# Patient Record
Sex: Female | Born: 1935 | Race: White | Hispanic: No | Smoking: Never smoker
Health system: Southern US, Community
[De-identification: ages and names within clinical notes are randomized; demographics above are authoritative.]

## PROBLEM LIST (undated history)

## (undated) DIAGNOSIS — E785 Hyperlipidemia, unspecified: Secondary | ICD-10-CM

## (undated) DIAGNOSIS — I10 Essential (primary) hypertension: Secondary | ICD-10-CM

## (undated) DIAGNOSIS — I499 Cardiac arrhythmia, unspecified: Secondary | ICD-10-CM

## (undated) DIAGNOSIS — Z952 Presence of prosthetic heart valve: Secondary | ICD-10-CM

## (undated) HISTORY — PX: EYE SURGERY: SHX253

## (undated) HISTORY — PX: OTHER SURGICAL HISTORY: SHX169

## (undated) HISTORY — PX: GLAUCOMA SURGERY: SHX656

## (undated) HISTORY — PX: CATARACT EXTRACTION: SUR2

## (undated) HISTORY — PX: PACEMAKER INSERTION: SHX728

---

## 2015-10-14 ENCOUNTER — Encounter
Admission: RE | Admit: 2015-10-14 | Discharge: 2015-10-14 | Disposition: A | Discharge status: Home / Self Care | Payer: Medicare Other | Source: Ambulatory Visit | Attending: Cardiology | Admitting: Cardiology

## 2015-10-14 ENCOUNTER — Ambulatory Visit
Admission: RE | Admit: 2015-10-14 | Discharge: 2015-10-14 | Disposition: A | Discharge status: Home / Self Care | Payer: Medicare Other | Source: Ambulatory Visit | Attending: Cardiology | Admitting: Cardiology

## 2015-10-14 DIAGNOSIS — Z952 Presence of prosthetic heart valve: Secondary | ICD-10-CM | POA: Insufficient documentation

## 2015-10-14 DIAGNOSIS — I495 Sick sinus syndrome: Secondary | ICD-10-CM | POA: Diagnosis not present

## 2015-10-14 DIAGNOSIS — Z01818 Encounter for other preprocedural examination: Secondary | ICD-10-CM | POA: Diagnosis not present

## 2015-10-14 DIAGNOSIS — Z45010 Encounter for checking and testing of cardiac pacemaker pulse generator [battery]: Secondary | ICD-10-CM

## 2015-10-14 HISTORY — DX: Presence of prosthetic heart valve: Z95.2

## 2015-10-14 HISTORY — DX: Essential (primary) hypertension: I10

## 2015-10-14 HISTORY — DX: Cardiac arrhythmia, unspecified: I49.9

## 2015-10-14 HISTORY — DX: Hyperlipidemia, unspecified: E78.5

## 2015-10-14 LAB — BASIC METABOLIC PANEL
Anion gap: 8 (ref 5–15)
BUN: 23 mg/dL — AB (ref 6–20)
CALCIUM: 10.2 mg/dL (ref 8.9–10.3)
CHLORIDE: 103 mmol/L (ref 101–111)
CO2: 29 mmol/L (ref 22–32)
CREATININE: 1.13 mg/dL — AB (ref 0.44–1.00)
GFR calc non Af Amer: 43 mL/min — ABNORMAL LOW (ref >60)
GFR, EST AFRICAN AMERICAN: 50 mL/min — AB (ref >60)
GLUCOSE: 97 mg/dL (ref 65–99)
Potassium: 5.2 mmol/L — ABNORMAL HIGH (ref 3.5–5.1)
Sodium: 140 mmol/L (ref 135–145)

## 2015-10-14 LAB — DIFFERENTIAL
Basophils Absolute: 0 10*3/uL (ref 0–0.1)
Basophils Relative: 1 %
EOS ABS: 0.1 10*3/uL (ref 0–0.7)
EOS PCT: 1 %
LYMPHS ABS: 2 10*3/uL (ref 1.0–3.6)
LYMPHS PCT: 27 %
MONO ABS: 0.6 10*3/uL (ref 0.2–0.9)
MONOS PCT: 8 %
Neutro Abs: 4.8 10*3/uL (ref 1.4–6.5)
Neutrophils Relative %: 63 %

## 2015-10-14 LAB — CBC
HEMATOCRIT: 41.7 % (ref 35.0–47.0)
HEMOGLOBIN: 13.7 g/dL (ref 12.0–16.0)
MCH: 30.9 pg (ref 26.0–34.0)
MCHC: 32.8 g/dL (ref 32.0–36.0)
MCV: 94.2 fL (ref 80.0–100.0)
Platelets: 247 10*3/uL (ref 150–440)
RBC: 4.42 MIL/uL (ref 3.80–5.20)
RDW: 12.9 % (ref 11.5–14.5)
WBC: 7.6 10*3/uL (ref 3.6–11.0)

## 2015-10-14 LAB — SURGICAL PCR SCREEN
MRSA, PCR: NEGATIVE
STAPHYLOCOCCUS AUREUS: NEGATIVE

## 2015-10-14 MED ORDER — CEFAZOLIN SODIUM 1-5 GM-% IV SOLN
1.0000 g | Freq: Once | INTRAVENOUS | Status: DC
  Filled 2015-10-14: qty 50

## 2015-10-14 NOTE — Pre-Procedure Instructions (Signed)
Met B results faxed to Dr Saralyn Pilar, recheck K+ ordered per anesthesia protocol

## 2015-10-14 NOTE — Pre-Procedure Instructions (Addendum)
Heather at office notified patient H&P is out of date and contraindication of Cefazolin due to patient allergy to Penicillin (vomiting)

## 2015-10-14 NOTE — Patient Instructions (Signed)
  Your procedure is scheduled on: Wednesday 10/16/2015 Report to Day Surgery. 2ND FLOOR MEDICAL MALL ENTRANCE To find out your arrival time please call [phone] between 1PM - 3PM on Tuesday 10/15/2015.  Remember: Instructions that are not followed completely may result in serious medical risk, up to and including death, or upon the discretion of your surgeon and anesthesiologist your surgery may need to be rescheduled.    __X__ 1. Do not eat food or drink liquids after midnight. No gum chewing or hard candies.     __X__ 2. No Alcohol for 24 hours before or after surgery.   ____ 3. Bring all medications with you on the day of surgery if instructed.    __X__ 4. Notify your doctor if there is any change in your medical condition     (cold, fever, infections).     Do not wear jewelry, make-up, hairpins, clips or nail polish.  Do not wear lotions, powders, or perfumes.   Do not shave 48 hours prior to surgery. Men may shave face and neck.  Do not bring valuables to the hospital.    [facility] is not responsible for any belongings or valuables.               Contacts, dentures or bridgework may not be worn into surgery.  Leave your suitcase in the car. After surgery it may be brought to your room.  For patients admitted to the hospital, discharge time is determined by your                treatment team.   Patients discharged the day of surgery will not be allowed to drive home.   Please read over the following fact sheets that you were given:   Surgical Site Infection Prevention   __X__ Take these medicines the morning of surgery with A SIP OF WATER:    1. AMLODIPINE  2.   3.   4.  5.  6.  ____ Fleet Enema (as directed)   __X__ Use CHG Soap as directed  ____ Use inhalers on the day of surgery  ____ Stop metformin 2 days prior to surgery    ____ Take 1/2 of usual insulin dose the night before surgery and none on the morning of surgery.   __X__ Stop  Coumadin/Plavix/aspirin on TODAY  ____ Stop Anti-inflammatories on    ____ Stop supplements until after surgery.    ____ Bring C-Pap to the hospital.

## 2015-10-14 NOTE — Pre-Procedure Instructions (Addendum)
TO: Cancel  Ancef 1 gram, no new antibiotic orders. Dr. Sula RumpleParaschos/CNewman RN

## 2015-10-16 ENCOUNTER — Encounter
Admission: RE | Disposition: A | Discharge status: Home / Self Care | Payer: Self-pay | Source: Ambulatory Visit | Attending: Cardiology

## 2015-10-16 ENCOUNTER — Inpatient Hospital Stay: Payer: Medicare Other | Admitting: *Deleted

## 2015-10-16 ENCOUNTER — Encounter: Payer: Self-pay | Admitting: *Deleted

## 2015-10-16 ENCOUNTER — Ambulatory Visit
Admission: RE | Admit: 2015-10-16 | Discharge: 2015-10-16 | Disposition: A | Discharge status: Home / Self Care | Payer: Medicare Other | Source: Ambulatory Visit | Attending: Cardiology | Admitting: Cardiology

## 2015-10-16 DIAGNOSIS — Z981 Arthrodesis status: Secondary | ICD-10-CM | POA: Insufficient documentation

## 2015-10-16 DIAGNOSIS — Z79890 Hormone replacement therapy (postmenopausal): Secondary | ICD-10-CM | POA: Insufficient documentation

## 2015-10-16 DIAGNOSIS — Z9889 Other specified postprocedural states: Secondary | ICD-10-CM | POA: Diagnosis not present

## 2015-10-16 DIAGNOSIS — I495 Sick sinus syndrome: Secondary | ICD-10-CM | POA: Diagnosis present

## 2015-10-16 DIAGNOSIS — Z45010 Encounter for checking and testing of cardiac pacemaker pulse generator [battery]: Secondary | ICD-10-CM | POA: Insufficient documentation

## 2015-10-16 DIAGNOSIS — I10 Essential (primary) hypertension: Secondary | ICD-10-CM | POA: Diagnosis not present

## 2015-10-16 DIAGNOSIS — Z888 Allergy status to other drugs, medicaments and biological substances status: Secondary | ICD-10-CM | POA: Insufficient documentation

## 2015-10-16 DIAGNOSIS — Z79899 Other long term (current) drug therapy: Secondary | ICD-10-CM | POA: Diagnosis not present

## 2015-10-16 DIAGNOSIS — Z880 Allergy status to penicillin: Secondary | ICD-10-CM | POA: Diagnosis not present

## 2015-10-16 DIAGNOSIS — I4891 Unspecified atrial fibrillation: Secondary | ICD-10-CM | POA: Diagnosis not present

## 2015-10-16 DIAGNOSIS — E785 Hyperlipidemia, unspecified: Secondary | ICD-10-CM | POA: Insufficient documentation

## 2015-10-16 DIAGNOSIS — Z7982 Long term (current) use of aspirin: Secondary | ICD-10-CM | POA: Diagnosis not present

## 2015-10-16 DIAGNOSIS — Z881 Allergy status to other antibiotic agents status: Secondary | ICD-10-CM | POA: Insufficient documentation

## 2015-10-16 HISTORY — PX: PACEMAKER INSERTION: SHX728

## 2015-10-16 LAB — POCT I-STAT 4, (NA,K, GLUC, HGB,HCT)
GLUCOSE: 112 mg/dL — AB (ref 65–99)
HCT: 40 % (ref 36.0–46.0)
Hemoglobin: 13.6 g/dL (ref 12.0–15.0)
Potassium: 4 mmol/L (ref 3.5–5.1)
Sodium: 139 mmol/L (ref 135–145)

## 2015-10-16 SURGERY — INSERTION, CARDIAC PACEMAKER
Anesthesia: Monitor Anesthesia Care | Site: Chest | Laterality: Right

## 2015-10-16 MED ORDER — SODIUM CHLORIDE 0.9 % IR SOLN
Freq: Once | Status: AC
  Administered 2015-10-16: 300 mL
  Filled 2015-10-16 (×2): qty 2

## 2015-10-16 MED ORDER — FAMOTIDINE 20 MG PO TABS
ORAL_TABLET | ORAL | Status: AC
  Administered 2015-10-16: 20 mg via ORAL
  Filled 2015-10-16: qty 1

## 2015-10-16 MED ORDER — LIDOCAINE HCL 1 % IJ SOLN
INTRAMUSCULAR | Status: DC | PRN
  Administered 2015-10-16: 20 mL

## 2015-10-16 MED ORDER — FAMOTIDINE 20 MG PO TABS
20.0000 mg | ORAL_TABLET | Freq: Once | ORAL | Status: AC
  Administered 2015-10-16: 20 mg via ORAL

## 2015-10-16 MED ORDER — GENTAMICIN SULFATE 40 MG/ML IJ SOLN
INTRAMUSCULAR | Status: AC
  Filled 2015-10-16: qty 2

## 2015-10-16 MED ORDER — FENTANYL CITRATE (PF) 100 MCG/2ML IJ SOLN: 25.0000 ug | INTRAMUSCULAR | Status: DC | PRN

## 2015-10-16 MED ORDER — ONDANSETRON HCL 4 MG/2ML IJ SOLN: 4.0000 mg | Freq: Once | INTRAMUSCULAR | Status: DC | PRN

## 2015-10-16 MED ORDER — CEFAZOLIN SODIUM 1-5 GM-% IV SOLN: 1.0000 g | Freq: Once | INTRAVENOUS | Status: DC

## 2015-10-16 MED ORDER — LACTATED RINGERS IV SOLN
INTRAVENOUS | Status: DC
  Administered 2015-10-16: 12:00:00 via INTRAVENOUS

## 2015-10-16 MED ORDER — LIDOCAINE HCL (CARDIAC) 20 MG/ML IV SOLN
INTRAVENOUS | Status: DC | PRN
  Administered 2015-10-16: 60 mg via INTRAVENOUS

## 2015-10-16 MED ORDER — HEPARIN SODIUM (PORCINE) 5000 UNIT/ML IJ SOLN
INTRAMUSCULAR | Status: AC
  Filled 2015-10-16: qty 1

## 2015-10-16 MED ORDER — PROPOFOL 500 MG/50ML IV EMUL
INTRAVENOUS | Status: DC | PRN
  Administered 2015-10-16: 75 ug/kg/min via INTRAVENOUS

## 2015-10-16 MED ORDER — CEFAZOLIN SODIUM 1-5 GM-% IV SOLN
INTRAVENOUS | Status: AC
  Filled 2015-10-16: qty 50

## 2015-10-16 MED ORDER — SODIUM CHLORIDE 0.9 % IJ SOLN
INTRAMUSCULAR | Status: AC
  Filled 2015-10-16: qty 50

## 2015-10-16 MED ORDER — AZITHROMYCIN 250 MG PO TABS: ORAL_TABLET | ORAL | Status: DC

## 2015-10-16 SURGICAL SUPPLY — 27 items
CABLE SURG 12 DISP A/V CHANNEL (MISCELLANEOUS) ×3 IMPLANT
CANISTER SUCT 1200ML W/VALVE (MISCELLANEOUS) ×3 IMPLANT
CHLORAPREP W/TINT 26ML (MISCELLANEOUS) ×3 IMPLANT
COVER LIGHT HANDLE STERIS (MISCELLANEOUS) ×6 IMPLANT
COVER MAYO STAND STRL (DRAPES) ×3 IMPLANT
DECANTER SPIKE VIAL GLASS SM (MISCELLANEOUS) ×3 IMPLANT
DEVICE DISSECT PLASMABLAD 3.0S (MISCELLANEOUS) ×1 IMPLANT
DRESSING TELFA 4X3 1S ST N-ADH (GAUZE/BANDAGES/DRESSINGS) ×6 IMPLANT
ELECT REM PT RETURN 9FT ADLT (ELECTROSURGICAL) ×3
ELECTRODE REM PT RTRN 9FT ADLT (ELECTROSURGICAL) ×1 IMPLANT
GLOVE BIO SURGEON STRL SZ7.5 (GLOVE) ×3 IMPLANT
GLOVE BIO SURGEON STRL SZ8 (GLOVE) ×3 IMPLANT
GOWN STRL REUS W/ TWL LRG LVL3 (GOWN DISPOSABLE) ×1 IMPLANT
GOWN STRL REUS W/ TWL XL LVL3 (GOWN DISPOSABLE) ×1 IMPLANT
GOWN STRL REUS W/TWL LRG LVL3 (GOWN DISPOSABLE) ×2
GOWN STRL REUS W/TWL XL LVL3 (GOWN DISPOSABLE) ×2
KIT RM TURNOVER STRD PROC AR (KITS) ×3 IMPLANT
LABEL OR SOLS (LABEL) ×3 IMPLANT
MARKER SKIN W/RULER 31145785 (MISCELLANEOUS) ×3 IMPLANT
NEEDLE FILTER BLUNT 18X 1/2SAF (NEEDLE) ×2
NEEDLE FILTER BLUNT 18X1 1/2 (NEEDLE) ×1 IMPLANT
NS IRRIG 500ML POUR BTL (IV SOLUTION) ×3 IMPLANT
PACK PACE INSERTION (MISCELLANEOUS) ×3 IMPLANT
PAD STATPAD (MISCELLANEOUS) ×3 IMPLANT
PLASMABLADE 3.0S (MISCELLANEOUS) ×3
PPM ADVISA MRI DR A2DR01 (Pacemaker) ×3 IMPLANT
SUT SILK 2 0 SH (SUTURE) ×3 IMPLANT

## 2015-10-16 NOTE — Anesthesia Preprocedure Evaluation (Addendum)
Anesthesia Evaluation  Patient identified by MRN, date of birth, ID band Patient awake    Reviewed: Allergy & Precautions, NPO status , Patient's Chart, lab work & pertinent test results  Airway Mallampati: I  TM Distance: >3 FB Neck ROM: Limited    Dental  (+) Teeth Intact   Pulmonary    Pulmonary exam normal        Cardiovascular Exercise Tolerance: Good hypertension, Pt. on medications + dysrhythmias Atrial Fibrillation + pacemaker (-) Cardiac Defibrillator  Rhythm:Regular Rate:Normal + Systolic murmurs Aortic valve replacement, afib, AICD.   Neuro/Psych    GI/Hepatic   Endo/Other    Renal/GU      Musculoskeletal   Abdominal Normal abdominal exam  (+)   Peds  Hematology   Anesthesia Other Findings   Reproductive/Obstetrics                           Anesthesia Physical Anesthesia Plan  ASA: IV  Anesthesia Plan: MAC   Post-op Pain Management:    Induction: Intravenous  Airway Management Planned: Nasal Cannula  Additional Equipment:   Intra-op Plan:   Post-operative Plan:   Informed Consent: I have reviewed the patients History and Physical, chart, labs and discussed the procedure including the risks, benefits and alternatives for the proposed anesthesia with the patient or authorized representative who has indicated his/her understanding and acceptance.     Plan Discussed with: CRNA  Anesthesia Plan Comments:         Anesthesia Quick Evaluation

## 2015-10-16 NOTE — Interval H&P Note (Signed)
History and Physical Interval Note:  10/16/2015 7:13 AM  [name]  has presented today for surgery, with the diagnosis of SINOTRIAL NODE DYSFUNCTION  The various methods of treatment have been discussed with the patient and family. After consideration of risks, benefits and other options for treatment, the patient has consented to  Procedure(s): INSERTION PACEMAKER  (N/A) as a surgical intervention .  The patient's history has been reviewed, patient examined, no change in status, stable for surgery.  I have reviewed the patient's chart and labs.  Questions were answered to the patient's satisfaction.     Tino Ronan

## 2015-10-16 NOTE — H&P (Signed)
Printout Information Document Contents Office Visit Document Received Date 10/16/2015 Document Source Organization [facility] Patient Demographics  Encounter Details  Progress Notes  Plan of Treatment  Visit Diagnoses  Document Information Encounter Summary - [name] (82 y.o. Female)As of Jan. 18, 2017 Patient Demographics Patient Address Communication Language Race / Ethnicity  [address] [place], [place] [address]  [phone] [facility]) [phone] (Mobile) [email or web address]  English (Preferred) White / Not Hispanic or Latino  Encounter Details Date Type Department Care Team Description  09/04/2015 Office Visit [facility]  [address]  [place], [place] [address]  [phone]  Denton Ar, MD  [address]  [facility] [address] - CARDIOLOGY  [place], [place] [address]  [phone]  [phone] (Fax)  Aortic prosthetic valve regurgitation, subsequent encounter (Primary Dx);Nonrheumatic aortic valve stenosis;Heart block AV third degree [facility])  Progress Notes - in this encounter Denton Ar, MD - 09/04/2015 10:45 AM EST Formatting of this note may be different from the original.   Chief Complaint: No chief complaint on file. Date of Service: 09/04/2015 Date of Birth: 05/28/1929 PCP: NONE  History of Present Illness: [name] is a 81 y.o.female patient who returns for follow-up visit. She has a history of aortic stenosis status post tissue prosthesis aortic valve replacement. She has a history of high-grade heart block which occurred at time of her valve surgery. She has a permanent pacemaker in place . Interrogation of her pacemaker today reveals approximately 5 months of battery life remaining. Will refer for generator change out. Risk and benefits of this procedure were explained to patient and her daughter. Past Medical and Surgical History  Past Medical History Past Medical History  Diagnosis  Date  . Aortic stenosis  s/p aortic valve replacement  . Atrial fibrillation, unspecified  . Cataract cortical, senile  . Hyperlipidemia  . Hypertension   Past Surgical History She has a past surgical history that includes lens eye surgery (Left, 04/22/09); lens eye surgery (Right, 06/24/09); glaucoma eye surgery (Left, 04/22/09); glaucoma eye surgery (Right, 06/24/09); back fusion; Insert / replace / remove pacemaker; and Cardiac valve replacement.   Medications and Allergies  Current Medications  Current Outpatient Prescriptions  Medication Sig Dispense Refill  . amLODIPine (NORVASC) 5 MG tablet Take 1 tablet (5 mg total) by mouth once daily. 30 tablet 0  . aspirin 81 MG EC tablet Take 81 mg by mouth daily.  . calcium carbonate (TUMS ULTRA) 400 mg (1,000 mg) chewable tablet Take 400 mg of elemental by mouth once daily.  Marland Kitchen estradiol (ESTRACE) 0.01 % (0.1 mg/gram) vaginal cream Place 2 g vaginally once a week.   No current facility-administered medications for this visit.   Allergies: Biaxin [clarithromycin]; Darvocet-n 100 [propoxyphene n-acetaminophen]; Hydrochlorothiazide; Penicillins; Betoptic s [betaxolol]; Lisinopril; Mydriacyl [tropicamide]; Naproxen; Amoxicillin (bulk); Cephalexin (bulk); Furosemide (bulk); and Gabapentin (bulk)  Social and Family History  Social History reports that she has never smoked. She does not have any smokeless tobacco history on file. Alcohol use questions deferred to the physician. She reports that she does not use illicit drugs.  Family History Family History  Problem Relation Age of Onset  . No Known Problems Mother  . No Known Problems Father   Review of Systems  Review of Systems  Constitutional: Negative for chills, diaphoresis, fever, malaise/fatigue and weight loss.  HENT: Negative for congestion, ear discharge, hearing loss and tinnitus.  Eyes: Negative for blurred vision.  Respiratory: Positive for shortness of breath. Negative for  cough, hemoptysis,  sputum production and wheezing.  Cardiovascular: Negative for chest pain, palpitations, orthopnea, claudication, leg swelling and PND.  Gastrointestinal: Negative for abdominal pain, blood in stool, constipation, diarrhea, heartburn, melena, nausea and vomiting.  Genitourinary: Negative for dysuria, frequency, hematuria and urgency.  Musculoskeletal: Negative for back pain, falls, joint pain and myalgias.  Skin: Negative for itching and rash.  Neurological: Negative for dizziness, tingling, focal weakness, loss of consciousness, weakness and headaches.  Endo/Heme/Allergies: Negative for polydipsia. Does not bruise/bleed easily.  Psychiatric/Behavioral: Negative for depression, memory loss and substance abuse. The patient is not nervous/anxious.    Physical Examination   Vitals: There were no vitals taken for this visit. Ht: Wt: ZOX:WRUEA is no height or weight on file to calculate BSA. There is no height or weight on file to calculate BMI.  Wt Readings from Last 3 Encounters:  08/27/15 60.8 kg (134 lb)  08/08/15 61.1 kg (134 lb 12.8 oz)  03/05/15 61.5 kg (135 lb 9.6 oz)   BP Readings from Last 3 Encounters:  08/27/15 140/82  08/08/15 144/78  03/05/15 136/64  general: Caucasian female in no acute distress  LUNGS Breath Sounds: Normal Percussion: Normal  CARDIOVASCULAR JVP CV wave: no HJR: no Elevation at 90 degrees: None Carotid Pulse: normal pulsation bilaterally Bruit: None Apex: apical impulse normal  Auscultation Rhythm: normal sinus rhythm and normal pacemaker rhythm S1: normal S2: normal Clicks: no Rub: no Murmurs: 1/6 medium pitched crescendo-decrescendo coarse at lower left sternal border, at upper left sternal border  Gallop: None ABDOMEN Liver enlargement: no Pulsatile aorta: no Ascites: no Bruits: no  EXTREMITIES Clubbing: no Edema: trace to 1+ bilateral pedal edema Pulses: peripheral pulses symmetrical Femoral Bruits:  no Amputation: no SKIN Rash: no Cyanosis: no Embolic phemonenon: no Bruising: no NEURO Alert and Oriented to person, place and time: yes Non focal: yes LABS Last 3 CBC results: Lab Results  Component Value Date  WBC 7.1 06/18/2009  WBC 6.6 04/10/2009   Lab Results  Component Value Date  HGB 12.6 06/18/2009  HGB 12.4 04/10/2009   Lab Results  Component Value Date  HCT 0.37 06/18/2009  HCT 0.37 04/10/2009   Lab Results  Component Value Date  PLT 268 06/18/2009  PLT 286 04/10/2009   Lab Results  Component Value Date  CREATININE 1.3 (H) 06/18/2009  BUN 23 (H) 06/18/2009  NA 136 06/18/2009  K 4.9 06/18/2009  CL 102 06/18/2009  CO2 28 06/18/2009     Assessment and Plan   82 y.o. female with  ICD-10-CM ICD-9-CM  1. Nonrheumatic aortic valve stenosis-complains of more exertional shortness of breath. EKG reveals paced rhythm. ECHO showed adequate prosthetic aortic valve function. Chest x-ray the no acute cardiopulmonary disease I35.0 424.1  2. Paroxysmal atrial fibrillation-currently in sinus rhythm. Will continue with current regimen . I48.0 427.31  3. Heart block AV third degree-pacemaker is functioning normally. Will schedule generator change out or the 3rd week in January. Risk and benefits of this procedure were explained I44.2 426.0   Return in about 3 months (around 12/03/2015).  These notes generated with voice recognition software. I apologize for typographical errors.  Denton Ar, MD    Plan of Treatment - as of this encounter Upcoming Encounters Upcoming Encounters  Date Type Specialty Care Team Description  11/05/2015 Appointment Cardiology Fath, Glenetta Borg, MD  [address] [facility] [address]  [facility] [address] - CARDIOLOGY  [place], [place] [address]  [phone]  [phone] (Fax)    11/20/2015 Appointment Cardiology Fath, Glenetta Borg, MD  [phone]  [facility] [address]  [facility] [address] - CARDIOLOGY  [place], [place] [address]   [phone]  [phone] (Fax)    Visit Diagnoses - in this encounter Diagnosis  Aortic prosthetic valve regurgitation, subsequent encounter - Primary  Nonrheumatic aortic valve stenosis  Heart block AV third degree [facility])  Atrioventricular block, complete   Document Information Service Providers Document Coverage Dates Dec. 07, 2016 - Dec. 08, 2016 Custodian Organization [facility] [phone] (Work) [place], [place] [address] Encounter Providers Denton Ar (Attending) [phone] (Work) [phone] (Fax)  [phone] [facility] [address] [facility] - CARDIOLOGY [place], [place] [address] Encounter Date Dec. 07, 2016 - Dec. 08, 2016 Printout Information Document Contents Office Visit Document Received Date 10/16/2015 Document Source Organization [facility] Patient Demographics  Encounter Details  Progress Notes  Plan of Treatment  Visit Diagnoses  Document Information Encounter Summary - [name] (82 y.o. Female)As of Jan. 18, 2017 Patient Demographics Patient Address Communication Language Race / Ethnicity  [address] [place], [place] [address]  [phone] [facility]) [phone] (Mobile) [email or web address]  English (Preferred) White / Not Hispanic or Latino  Encounter Details Date Type Department Care Team Description  09/04/2015 Office Visit [facility]  [address]  [place], [place] [address]  [phone]  Denton Ar, MD  [address] [facility] [address]  [facility] [address] - CARDIOLOGY  [place], [place] [address]  [phone]  [phone] (Fax)  Aortic prosthetic valve regurgitation, subsequent encounter (Primary Dx);Nonrheumatic aortic valve stenosis;Heart block AV third degree [facility])  Progress Notes - in this encounter Denton Ar, MD - 09/04/2015 10:45 AM EST Formatting of this note may be different from the original.   Chief Complaint: No chief complaint on file. Date of  Service: 09/04/2015 Date of Birth: 03-30-1929 PCP: NONE  History of Present Illness: Ms. Giaimo is a 82 y.o.female patient who returns for follow-up visit. She has a history of aortic stenosis status post tissue prosthesis aortic valve replacement. She has a history of high-grade heart block which occurred at time of her valve surgery. She has a permanent pacemaker in place . Interrogation of her pacemaker today reveals approximately 5 months of battery life remaining. Will refer for generator change out. Risk and benefits of this procedure were explained to patient and her daughter. Past Medical and Surgical History  Past Medical History Past Medical History  Diagnosis Date  . Aortic stenosis  s/p aortic valve replacement  . Atrial fibrillation, unspecified  . Cataract cortical, senile  . Hyperlipidemia  . Hypertension   Past Surgical History She has a past surgical history that includes lens eye surgery (Left, 04/22/09); lens eye surgery (Right, 06/24/09); glaucoma eye surgery (Left, 04/22/09); glaucoma eye surgery (Right, 06/24/09); back fusion; Insert / replace / remove pacemaker; and Cardiac valve replacement.   Medications and Allergies  Current Medications  Current Outpatient Prescriptions  Medication Sig Dispense Refill  . amLODIPine (NORVASC) 5 MG tablet Take 1 tablet (5 mg total) by mouth once daily. 30 tablet 0  . aspirin 81 MG EC tablet Take 81 mg by mouth daily.  . calcium carbonate (TUMS ULTRA) 400 mg (1,000 mg) chewable tablet Take 400 mg of elemental by mouth once daily.  Marland Kitchen estradiol (ESTRACE) 0.01 % (0.1 mg/gram) vaginal cream Place 2 g vaginally once a week.   No current facility-administered medications for this visit.   Allergies: Biaxin [clarithromycin]; Darvocet-n 100 [propoxyphene n-acetaminophen]; Hydrochlorothiazide; Penicillins; Betoptic s [betaxolol]; Lisinopril; Mydriacyl [tropicamide]; Naproxen; Amoxicillin (bulk); Cephalexin (bulk); Furosemide (bulk); and  Gabapentin (bulk)  Social and Family History  Social History reports that she has never smoked. She does not have any smokeless tobacco history on file. Alcohol use questions deferred to the physician. She reports that she does not use illicit drugs.  Family History Family History  Problem Relation Age of Onset  . No Known Problems Mother  . No Known Problems Father   Review of Systems  Review of Systems  Constitutional: Negative for chills, diaphoresis, fever, malaise/fatigue and weight loss.  HENT: Negative for congestion, ear discharge, hearing loss and tinnitus.  Eyes: Negative for blurred vision.  Respiratory: Positive for shortness of breath. Negative for cough, hemoptysis, sputum production and wheezing.  Cardiovascular: Negative for chest pain, palpitations, orthopnea, claudication, leg swelling and PND.  Gastrointestinal: Negative for abdominal pain, blood in stool, constipation, diarrhea, heartburn, melena, nausea and vomiting.  Genitourinary: Negative for dysuria, frequency, hematuria and urgency.  Musculoskeletal: Negative for back pain, falls, joint pain and myalgias.  Skin: Negative for itching and rash.  Neurological: Negative for dizziness, tingling, focal weakness, loss of consciousness, weakness and headaches.  Endo/Heme/Allergies: Negative for polydipsia. Does not bruise/bleed easily.  Psychiatric/Behavioral: Negative for depression, memory loss and substance abuse. The patient is not nervous/anxious.    Physical Examination   Vitals: There were no vitals taken for this visit. Ht: Wt: ZOX:WRUEA is no height or weight on file to calculate BSA. There is no height or weight on file to calculate BMI.  Wt Readings from Last 3 Encounters:  08/27/15 60.8 kg (134 lb)  08/08/15 61.1 kg (134 lb 12.8 oz)  03/05/15 61.5 kg (135 lb 9.6 oz)   BP Readings from Last 3 Encounters:  08/27/15 140/82  08/08/15 144/78  03/05/15 136/64  general: Caucasian female in no acute  distress  LUNGS Breath Sounds: Normal Percussion: Normal  CARDIOVASCULAR JVP CV wave: no HJR: no Elevation at 90 degrees: None Carotid Pulse: normal pulsation bilaterally Bruit: None Apex: apical impulse normal  Auscultation Rhythm: normal sinus rhythm and normal pacemaker rhythm S1: normal S2: normal Clicks: no Rub: no Murmurs: 1/6 medium pitched crescendo-decrescendo coarse at lower left sternal border, at upper left sternal border  Gallop: None ABDOMEN Liver enlargement: no Pulsatile aorta: no Ascites: no Bruits: no  EXTREMITIES Clubbing: no Edema: trace to 1+ bilateral pedal edema Pulses: peripheral pulses symmetrical Femoral Bruits: no Amputation: no SKIN Rash: no Cyanosis: no Embolic phemonenon: no Bruising: no NEURO Alert and Oriented to person, place and time: yes Non focal: yes LABS Last 3 CBC results: Lab Results  Component Value Date  WBC 7.1 06/18/2009  WBC 6.6 04/10/2009   Lab Results  Component Value Date  HGB 12.6 06/18/2009  HGB 12.4 04/10/2009   Lab Results  Component Value Date  HCT 0.37 06/18/2009  HCT 0.37 04/10/2009   Lab Results  Component Value Date  PLT 268 06/18/2009  PLT 286 04/10/2009   Lab Results  Component Value Date  CREATININE 1.3 (H) 06/18/2009  BUN 23 (H) 06/18/2009  NA 136 06/18/2009  K 4.9 06/18/2009  CL 102 06/18/2009  CO2 28 06/18/2009     Assessment and Plan   82 y.o. female with  ICD-10-CM ICD-9-CM  1. Nonrheumatic aortic valve stenosis-complains of more exertional shortness of breath. EKG reveals paced rhythm. ECHO showed adequate prosthetic aortic valve function. Chest x-ray the no acute cardiopulmonary disease I35.0 424.1  2. Paroxysmal atrial fibrillation-currently in sinus rhythm. Will continue with current regimen . I48.0 427.31  3. Heart block AV third degree-pacemaker  is functioning normally. Will schedule generator change out or the 3rd week in January. Risk and benefits of this  procedure were explained I44.2 426.0   Return in about 3 months (around 12/03/2015).  These notes generated with voice recognition software. I apologize for typographical errors.  Denton Ar, MD    Plan of Treatment - as of this encounter Upcoming Encounters Upcoming Encounters  Date Type Specialty Care Team Description  11/05/2015 Appointment Cardiology Fath, Glenetta Borg, MD  [address] [facility] [address]  [facility] - CARDIOLOGY  [place], [place] [address]  [phone]  [phone] (Fax)    11/20/2015 Appointment Cardiology Fath, Glenetta Borg, MD  [address] [facility] [address]  [facility] - CARDIOLOGY  [place], [place] [address]  [phone]  [phone] (Fax)    Visit Diagnoses - in this encounter Diagnosis  Aortic prosthetic valve regurgitation, subsequent encounter - Primary  Nonrheumatic aortic valve stenosis  Heart block AV third degree [facility])  Atrioventricular block, complete   Document Information Service Providers Document Coverage Dates Dec. 07, 2016 - Dec. 08, 2016 Custodian Organization [facility] [phone] (Work) [place], [place] [address] Encounter Providers Denton Ar (Attending) [phone] (Work) [phone] (Fax)  [phone] [facility] [address] [facility] - CARDIOLOGY [place], [place] [address] Encounter Date Dec. 07, 2016 - Dec. 08, 2016

## 2015-10-16 NOTE — Discharge Instructions (Signed)

## 2015-10-16 NOTE — Anesthesia Postprocedure Evaluation (Signed)
Anesthesia Post Note  Patient: [name]  Procedure(s) Performed: Procedure(s) (LRB): ICD GENERATOR CHANGE (Right)  Patient location during evaluation: PACU Anesthesia Type: General Level of consciousness: awake Pain management: pain level controlled Vital Signs Assessment: post-procedure vital signs reviewed and stable Respiratory status: spontaneous breathing Cardiovascular status: blood pressure returned to baseline Postop Assessment: no headache Anesthetic complications: no    Last Vitals:  Filed Vitals:   10/16/15 1111  BP: 162/66  Pulse: 91  Temp: 35.7 C  Resp: 16    Last Pain: There were no vitals filed for this visit.               Rickita Forstner M

## 2015-10-16 NOTE — Transfer of Care (Signed)
Immediate Anesthesia Transfer of Care Note  Patient: [name]  Procedure(s) Performed: Procedure(s): ICD GENERATOR CHANGE (Right)  Patient Location: PACU  Anesthesia Type:General  Level of Consciousness: awake, alert  and oriented  Airway & Oxygen Therapy: Patient Spontanous Breathing and Patient connected to face mask oxygen  Post-op Assessment: Report given to RN and Post -op Vital signs reviewed and stable  Post vital signs: Reviewed and stable  Last Vitals: 99% sat at 13:06 hr 64 pacing  Filed Vitals:   10/16/15 1111 10/16/15 1300  BP: 162/66 126/61  Pulse: 91   Temp: 35.7 C 36.7 C  Resp: 16 16    Complications: No apparent anesthesia complications

## 2015-10-16 NOTE — Op Note (Signed)
[facility]   10/16/2015                     1:09 PM  PATIENT:  [name]    PRE-OPERATIVE DIAGNOSIS:  SINOTRIAL NODE DYSFUNCTION  POST-OPERATIVE DIAGNOSIS:  Same  PROCEDURE:  ICD GENERATOR CHANGE  SURGEON:  Marcina Millard, MD    ANESTHESIA:     PREOPERATIVE INDICATIONS:  [name] is a  82 y.o. female with a diagnosis of SINOTRIAL NODE DYSFUNCTION who failed conservative measures and elected for surgical management.    The risks benefits and alternatives were discussed with the patient preoperatively including but not limited to the risks of infection, bleeding, cardiopulmonary complications, the need for revision surgery, among others, and the patient was willing to proceed.   OPERATIVE PROCEDURE: The patient was brought to the operating room the fasting state. The right pectoral region was prepped and draped in the usual sterile manner. Anesthesia was obtained with 1% lidocaine locally. A 6 cm incision was performed over the right pectoral region. The old pacemaker generator was retrieved by electrocautery and blunt dissection. The leads were disconnected and connected to a new MRI compatible dual-chamber rate responsive pacemaker generator ( Medtronic A2 DRO 1). The pocket was closed with 20 and 4-0 Vicryl, respectively. Steri-Strips and pressure dressing were applied. There were no procedural complications.

## 2015-12-11 ENCOUNTER — Ambulatory Visit
Admission: EM | Admit: 2015-12-11 | Discharge: 2015-12-11 | Disposition: A | Discharge status: Home / Self Care | Payer: Medicare Other | Attending: Family Medicine | Admitting: Family Medicine

## 2015-12-11 ENCOUNTER — Ambulatory Visit: Payer: Medicare Other

## 2015-12-11 ENCOUNTER — Encounter: Payer: Self-pay | Admitting: Emergency Medicine

## 2015-12-11 DIAGNOSIS — Z7982 Long term (current) use of aspirin: Secondary | ICD-10-CM | POA: Insufficient documentation

## 2015-12-11 DIAGNOSIS — I10 Essential (primary) hypertension: Secondary | ICD-10-CM | POA: Diagnosis not present

## 2015-12-11 DIAGNOSIS — Z952 Presence of prosthetic heart valve: Secondary | ICD-10-CM | POA: Diagnosis not present

## 2015-12-11 DIAGNOSIS — Z79899 Other long term (current) drug therapy: Secondary | ICD-10-CM | POA: Diagnosis not present

## 2015-12-11 DIAGNOSIS — I4891 Unspecified atrial fibrillation: Secondary | ICD-10-CM | POA: Diagnosis not present

## 2015-12-11 DIAGNOSIS — J0110 Acute frontal sinusitis, unspecified: Secondary | ICD-10-CM | POA: Diagnosis not present

## 2015-12-11 DIAGNOSIS — E785 Hyperlipidemia, unspecified: Secondary | ICD-10-CM | POA: Insufficient documentation

## 2015-12-11 DIAGNOSIS — J069 Acute upper respiratory infection, unspecified: Secondary | ICD-10-CM | POA: Diagnosis present

## 2015-12-11 MED ORDER — AZITHROMYCIN 250 MG PO TABS: ORAL_TABLET | ORAL | Status: DC

## 2015-12-11 MED ORDER — BENZONATATE 100 MG PO CAPS: 100.0000 mg | ORAL_CAPSULE | Freq: Three times a day (TID) | ORAL | Status: DC | PRN

## 2015-12-11 NOTE — ED Provider Notes (Signed)
[facility]  ____________________________________________  Time seen: Approximately 1620 PM  I have reviewed the triage vital signs and the nursing notes.   HISTORY  Chief Complaint URI   HPI [name] is a 82 y.o. female presents with daughter at bedside, for the complaints of 7-8 days of runny nose, nasal congestion, cough and sinus pressure. Reports occasionally coughing up and blowing nose and greenish mucus. Denies fevers. Reports continues to eat and drink well. Reports continues to remain active. States cough is mild. Patient states that her daughter wanted her to be checked out.  Denies chest pain, shortness of breath, dizziness, weakness, hearing changes, abdominal pain, dysuria, neck pain, back pain, extremity pain or extremity swelling. Denies recent sickness.   PCP: Lady Gary   Past Medical History  Diagnosis Date  . Dysrhythmia     A-fib  . Hyperlipidemia   . Hypertension   . H/O aortic valve replacement     There are no active problems to display for this patient.   Past Surgical History  Procedure Laterality Date  . Aortic stenosos    . Eye surgery    . Cataract extraction    . Glaucoma surgery    . Pacemaker insertion Right   . Pacemaker insertion Right 10/16/2015    Procedure: ICD GENERATOR CHANGE;  Surgeon: Marcina Millard, MD;  Location: [facility] ORS;  Service: Cardiovascular;  Laterality: Right;    Current Outpatient Rx  Name  Route  Sig  Dispense  Refill  . amLODipine (NORVASC) 5 MG tablet   Oral   Take 5 mg by mouth daily.         Marland Kitchen aspirin 81 MG tablet   Oral   Take 81 mg by mouth daily.         .           .           . CALCIUM & MAGNESIUM CARBONATES PO   Oral   Take 1 tablet by mouth daily.         Marland Kitchen estradiol (ESTRACE) 0.1 MG/GM vaginal cream   Vaginal   Place 1 Applicatorful vaginally at bedtime.         Marland Kitchen VITAMIN D, CHOLECALCIFEROL, PO   Oral   Take 1 tablet by mouth daily.            Allergies Lisinopril; Amoxicillin; Betoptic; Biaxin; Cephalexin; Furosemide; Gabapentin; Hydrochlorothiazide; Mydriacyl; Penicillins; and Naproxen  No family history on file.  Social History Social History  Substance Use Topics  . Smoking status: Never Smoker   . Smokeless tobacco: Never Used  . Alcohol Use: Yes     Comment: wine occ    Review of Systems Constitutional: No fever/chills Eyes: No visual changes. ENT: No sore throat. Positive runny nose, nasal congestion, sinus drainage.  Cardiovascular: Denies chest pain. Respiratory: Denies shortness of breath. Gastrointestinal: No abdominal pain.  No nausea, no vomiting.  No diarrhea.  No constipation. Genitourinary: Negative for dysuria. Musculoskeletal: Negative for back pain. Skin: Negative for rash. Neurological: Negative for headaches, focal weakness or numbness.  10-point ROS otherwise negative.  ____________________________________________   PHYSICAL EXAM:  VITAL SIGNS: ED Triage Vitals  Enc Vitals Group     BP 12/11/15 1519 149/87 mmHg     Pulse Rate 12/11/15 1519 80     Resp 12/11/15 1519 18     Temp 12/11/15 1519 97.1 F (36.2 C)     Temp src -- oral     SpO2  12/11/15 1519 98 %     Weight 12/11/15 1519 134 lb (60.782 kg)     Height 12/11/15 1519 5' (1.524 m)     Head Cir --      Peak Flow --      Pain Score 12/11/15 1521 2     Pain Loc --      Pain Edu? --      Excl. in GC? --   Constitutional: Alert and oriented. Well appearing and in no acute distress. Eyes: Conjunctivae are normal. PERRL. EOMI. Head: Atraumatic.Mild to moderate tenderness to palpation bilateral frontal; no tenderness to bilateral maxillary sinuses. No swelling. No erythema.   Ears: no erythema, normal TMs bilaterally.   Nose: nasal congestion with bilateral nasal turbinate erythema and edema.   Mouth/Throat: Mucous membranes are moist.  Oropharynx non-erythematous.No tonsillar swelling or exudate.  Neck: No stridor.  No  cervical spine tenderness to palpation. Hematological/Lymphatic/Immunilogical: No cervical lymphadenopathy. Cardiovascular: Normal rate, regular rhythm. Grossly normal heart sounds.  Good peripheral circulation. Respiratory: Normal respiratory effort.  No retractions. Lungs CTAB. No wheezes, rales or rhonchi. Good air movement.  Gastrointestinal: Soft and nontender. No distention. Normal Bowel sounds. No CVA tenderness. Musculoskeletal: No lower or upper extremity tenderness nor edema.  Bilateral pedal pulses equal and easily palpated. No cervical, thoracic or lumbar tenderness to palpation.  No calf tenderness bilaterally.  Neurologic:  Normal speech and language. No gross focal neurologic deficits are appreciated. No gait instability. Skin:  Skin is warm, dry and intact. No rash noted. Psychiatric: Mood and affect are normal. Speech and behavior are normal.  ____________________________________________   LABS (all labs ordered are listed, but only abnormal results are displayed)  Labs Reviewed - No data to display  RADIOLOGY  EXAM: CHEST 2 VIEW  COMPARISON: 10/14/2015  FINDINGS: Cardiac shadow is stable. A pacing device is again seen. Postoperative changes are noted. Aortic valve replacement is seen. Lungs are well aerated bilaterally. Degenerative changes of the thoracic and lumbar spine are noted.  IMPRESSION: No active cardiopulmonary disease.   Electronically Signed By: Alcide CleverMark Lukens M.D. On: 12/11/2015 15:49      I, Renford DillsLindsey Rafik Koppel, personally viewed and evaluated these images (plain radiographs) as part of my medical decision making, as well as reviewing the written report by the radiologist.    INITIAL IMPRESSION / ASSESSMENT AND PLAN / ED COURSE  Pertinent labs & imaging results that were available during my care of the patient were reviewed by me and considered in my medical decision making (see chart for details).  Very well-appearing patient. No  acute distress. Ambulatory in room. Daughter at bedside. Presents for the complaints of runny nose, nasal congestion, sinus pressure, sinus drainage and cough 7-8 days. Lungs clear throughout. Abdomen soft and nontender. Moist mucous membranes. Bilateral frontal sinus tenderness to palpation, with sinus drainage. Suspect frontal sinusitis. Discussed in detail with patient and daughter, patient with multiple antibiotic allergies as well as sensitivities. Will treat patient with oral azithromycin and when necessary Tessalon Perles. Patient reports that she has taken azithromycin multiple times in the past and has tolerated well. Encouraged rest, fluids and PCP follow-up this week.  Discussed follow up with Primary care physician this week. Discussed follow up and return parameters including no resolution or any worsening concerns. Patient verbalized understanding and agreed to plan.   ____________________________________________   FINAL CLINICAL IMPRESSION(S) / ED DIAGNOSES  Final diagnoses:  Acute frontal sinusitis, recurrence not specified      Note: This dictation was  prepared with Dragon dictation along with smaller phrase technology. Any transcriptional errors that result from this process are unintentional.    Renford Dills, NP 12/13/15 1131  Renford Dills, NP 12/13/15 1133

## 2015-12-11 NOTE — Discharge Instructions (Signed)
Take medication as prescribed. Rest. Drink plenty of fluids.  ° °Follow up with your primary care physician this week. Return to Urgent care for new or worsening concerns.  ° ° °Sinusitis, Adult °Sinusitis is redness, soreness, and inflammation of the paranasal sinuses. Paranasal sinuses are air pockets within the bones of your face. They are located beneath your eyes, in the middle of your forehead, and above your eyes. In healthy paranasal sinuses, mucus is able to drain out, and air is able to circulate through them by way of your nose. However, when your paranasal sinuses are inflamed, mucus and air can become trapped. This can allow bacteria and other germs to grow and cause infection. °Sinusitis can develop quickly and last only a short time (acute) or continue over a long period (chronic). Sinusitis that lasts for more than 12 weeks is considered chronic. °CAUSES °Causes of sinusitis include: °· Allergies. °· Structural abnormalities, such as displacement of the cartilage that separates your nostrils (deviated septum), which can decrease the air flow through your nose and sinuses and affect sinus drainage. °· Functional abnormalities, such as when the small hairs (cilia) that line your sinuses and help remove mucus do not work properly or are not present. °SIGNS AND SYMPTOMS °Symptoms of acute and chronic sinusitis are the same. The primary symptoms are pain and pressure around the affected sinuses. Other symptoms include: °· Upper toothache. °· Earache. °· Headache. °· Bad breath. °· Decreased sense of smell and taste. °· A cough, which worsens when you are lying flat. °· Fatigue. °· Fever. °· Thick drainage from your nose, which often is green and may contain pus (purulent). °· Swelling and warmth over the affected sinuses. °DIAGNOSIS °Your health care provider will perform a physical exam. During your exam, your health care provider may perform any of the following to help determine if you have acute  sinusitis or chronic sinusitis: °· Look in your nose for signs of abnormal growths in your nostrils (nasal polyps). °· Tap over the affected sinus to check for signs of infection. °· View the inside of your sinuses using an imaging device that has a light attached (endoscope). °If your health care provider suspects that you have chronic sinusitis, one or more of the following tests may be recommended: °· Allergy tests. °· Nasal culture. A sample of mucus is taken from your nose, sent to a lab, and screened for bacteria. °· Nasal cytology. A sample of mucus is taken from your nose and examined by your health care provider to determine if your sinusitis is related to an allergy. °TREATMENT °Most cases of acute sinusitis are related to a viral infection and will resolve on their own within 10 days. Sometimes, medicines are prescribed to help relieve symptoms of both acute and chronic sinusitis. These may include pain medicines, decongestants, nasal steroid sprays, or saline sprays. °However, for sinusitis related to a bacterial infection, your health care provider will prescribe antibiotic medicines. These are medicines that will help kill the bacteria causing the infection. °Rarely, sinusitis is caused by a fungal infection. In these cases, your health care provider will prescribe antifungal medicine. °For some cases of chronic sinusitis, surgery is needed. Generally, these are cases in which sinusitis recurs more than 3 times per year, despite other treatments. °HOME CARE INSTRUCTIONS °· Drink plenty of water. Water helps thin the mucus so your sinuses can drain more easily. °· Use a humidifier. °· Inhale steam 3-4 times a day (for example, sit in the bathroom   with the shower running). °· Apply a warm, moist washcloth to your face 3-4 times a day, or as directed by your health care provider. °· Use saline nasal sprays to help moisten and clean your sinuses. °· Take medicines only as directed by your health care  provider. °· If you were prescribed either an antibiotic or antifungal medicine, finish it all even if you start to feel better. °SEEK IMMEDIATE MEDICAL CARE IF: °· You have increasing pain or severe headaches. °· You have nausea, vomiting, or drowsiness. °· You have swelling around your face. °· You have vision problems. °· You have a stiff neck. °· You have difficulty breathing. °  °This information is not intended to replace advice given to you by your health care provider. Make sure you discuss any questions you have with your health care provider. °  °Document Released: 09/14/2005 Document Revised: 10/05/2014 Document Reviewed: 09/29/2011 °[employer] Interactive Patient Education ©2016 [employer] ° °

## 2015-12-11 NOTE — ED Notes (Signed)
Pt reports cold symptoms including cough, cold, hoarseness, and headache ongoing over last several days. Pt denies known fever

## 2016-02-17 ENCOUNTER — Ambulatory Visit
Admission: EM | Admit: 2016-02-17 | Discharge: 2016-02-17 | Disposition: A | Discharge status: Home / Self Care | Payer: Medicare Other | Attending: Family Medicine | Admitting: Family Medicine

## 2016-02-17 ENCOUNTER — Encounter: Payer: Self-pay | Admitting: *Deleted

## 2016-02-17 DIAGNOSIS — H109 Unspecified conjunctivitis: Secondary | ICD-10-CM | POA: Diagnosis not present

## 2016-02-17 MED ORDER — ERYTHROMYCIN 5 MG/GM OP OINT: TOPICAL_OINTMENT | OPHTHALMIC | Status: AC

## 2016-02-17 MED ORDER — KETOTIFEN FUMARATE 0.025 % OP SOLN: 1.0000 [drp] | Freq: Two times a day (BID) | OPHTHALMIC | Status: AC

## 2016-02-17 MED ORDER — CARBOXYMETHYLCELLULOSE SODIUM 1 % OP SOLN: 1.0000 [drp] | Freq: Three times a day (TID) | OPHTHALMIC | Status: AC

## 2016-02-17 NOTE — ED Provider Notes (Signed)
CSN: [id number]     Arrival date & time 02/17/16  1137 History   First MD Initiated Contact with Patient 02/17/16 1306     Chief Complaint  Patient presents with  . 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  Eye discharge, itching, redness started this am with eye right matted shut.  Denied seasonal allergies. Slight headache frontal today right along with noticed decreased sharpness right eye vision compared to normal.  Patient is a 82 y.o. female presenting with conjunctivitis. The history is provided by the patient.  Conjunctivitis This is a new problem. The current episode started 12 to 24 hours ago. The problem occurs constantly. The problem has not changed since onset.Associated symptoms include headaches. Pertinent negatives include no chest pain, no abdominal pain and no shortness of breath. Nothing aggravates the symptoms. Nothing relieves the symptoms. She has tried a cold compress, rest, food and water for the symptoms. The treatment provided no relief.    Past Medical History  Diagnosis Date  . Dysrhythmia     A-fib  . Hyperlipidemia   . Hypertension   . H/O aortic valve replacement    Past Surgical History  Procedure Laterality Date  . Aortic stenosos    . Eye surgery    . Cataract extraction    . Glaucoma surgery    . Pacemaker insertion Right   . Pacemaker insertion Right 10/16/2015    Procedure: ICD GENERATOR CHANGE;  Surgeon: Marcina Millard, MD;  Location: [facility] ORS;  Service: Cardiovascular;  Laterality: Right;   History reviewed. No pertinent family history. Social History  Substance Use Topics  . Smoking status: Never Smoker   . Smokeless tobacco: Never Used  . Alcohol Use: Yes     Comment: wine occ   OB History    No data available     Review of Systems   Constitutional: Negative for fever, chills, diaphoresis, activity change, appetite change, fatigue and unexpected weight change.  HENT: Negative for congestion, dental problem, drooling, ear discharge, ear pain, facial swelling, hearing loss, mouth sores, nosebleeds, postnasal drip, rhinorrhea, sinus pressure, sneezing, sore throat, tinnitus, trouble swallowing and voice change.   Eyes: Positive for photophobia, pain, discharge, redness, itching and visual disturbance.  Respiratory: Negative for cough, choking, chest tightness, shortness of breath, wheezing and stridor.   Cardiovascular: Negative for chest pain, palpitations and leg swelling.  Gastrointestinal: Negative for nausea, vomiting, abdominal pain, diarrhea, constipation, blood in stool and abdominal distention.  Endocrine: Negative for cold intolerance and heat intolerance.  Genitourinary: Negative for dysuria, hematuria and difficulty urinating.  Musculoskeletal: Negative for myalgias, back pain, joint swelling, arthralgias, gait problem, neck pain and neck stiffness.  Skin: Negative for color change, pallor, rash and wound.  Allergic/Immunologic: Negative for environmental allergies and food allergies.  Neurological: Positive for headaches. Negative for dizziness, tremors, seizures, syncope, facial asymmetry, speech difficulty, weakness, light-headedness and numbness.  Hematological: Negative for adenopathy. Does not bruise/bleed easily.  Psychiatric/Behavioral: Negative for behavioral problems, confusion, sleep disturbance and agitation.    Allergies  Lisinopril; Amoxicillin; Betoptic; Biaxin; Cephalexin; Furosemide; Gabapentin; Hydrochlorothiazide; Mydriacyl; Penicillins; and Naproxen  Home Medications   Prior to Admission medications   Medication Sig Start Date End Date Taking? Authorizing Provider  amLODipine (NORVASC) 5 MG tablet Take 5 mg by mouth daily.   Yes Historical Provider, MD  aspirin 81 MG tablet Take 81 mg by  mouth  daily.   Yes Historical Provider, MD  CALCIUM & MAGNESIUM CARBONATES PO Take 1 tablet by mouth daily.   Yes Historical Provider, MD  estradiol (ESTRACE) 0.1 MG/GM vaginal cream Place 1 Applicatorful vaginally at bedtime.   Yes Historical Provider, MD  VITAMIN D, CHOLECALCIFEROL, PO Take 1 tablet by mouth daily.   Yes Historical Provider, MD  carboxymethylcellulose 1 % ophthalmic solution Apply 1 drop to eye 3 (three) times daily. 02/17/16 02/27/16  Barbaraann Barthelina A Betancourt, NP  erythromycin ophthalmic ointment Place a 1/2 inch ribbon of ointment into the lower eyelid right twice a day for 7-10 days 02/17/16 02/27/16  Barbaraann Barthelina A Betancourt, NP  ketotifen (ZADITOR) 0.025 % ophthalmic solution Place 1 drop into the right eye 2 (two) times daily. 02/17/16 02/27/16  Barbaraann Barthelina A Betancourt, NP   Meds Ordered and Administered this Visit  Medications - No data to display  BP 184/73 mmHg  Pulse 66  Temp(Src) 97.8 F (36.6 C) (Oral)  Resp 16  Ht 5' (1.524 m)  Wt 135 lb (61.236 kg)  BMI 26.37 kg/m2  SpO2 100% No data found.   Physical Exam  Constitutional: She is oriented to person, place, and time. She appears well-developed and well-nourished. She is active and cooperative.  Non-toxic appearance. She does not have a sickly appearance. She does not appear ill. No distress.  Patient reported blood pressure typically elevated at doctor's office; has cuff at home normal 145-150/70  HENT:  Head: Normocephalic and atraumatic.  Right Ear: Hearing, external ear and ear canal normal. A middle ear effusion is present.  Left Ear: Hearing, external ear and ear canal normal. A middle ear effusion is present.  Nose: No mucosal edema, rhinorrhea, nose lacerations, sinus tenderness, nasal deformity, septal deviation or nasal septal hematoma. No epistaxis.  No foreign bodies. Right sinus exhibits no maxillary sinus tenderness and no frontal sinus tenderness. Left sinus exhibits no maxillary sinus tenderness and no frontal sinus  tenderness.  Mouth/Throat: Uvula is midline and mucous membranes are normal. Mucous membranes are not pale, not dry and not cyanotic. She does not have dentures. No oral lesions. No trismus in the jaw. Normal dentition. No dental abscesses, uvula swelling, lacerations or dental caries. Posterior oropharyngeal edema and posterior oropharyngeal erythema present. No oropharyngeal exudate or tonsillar abscesses.  Cobblestoning posterior pharynx/ bilateral TMs with air fluid level clear  Eyes: EOM are normal. Pupils are equal, round, and reactive to light. Right eye exhibits discharge. Right eye exhibits no chemosis, no exudate and no hordeolum. No foreign body present in the right eye. Left eye exhibits no chemosis, no discharge, no exudate and no hordeolum. No foreign body present in the left eye. Right conjunctiva is injected. Right conjunctiva has no hemorrhage. Left conjunctiva is injected. Left conjunctiva has no hemorrhage. No scleral icterus. Right eye exhibits normal extraocular motion and no nystagmus. Left eye exhibits normal extraocular motion and no nystagmus. Right pupil is round and reactive. Left pupil is round and reactive. Pupils are equal.    Upper right eyelid slight edema/erythema 1+/4 at lash line  Neck: Trachea normal and normal range of motion. Neck supple. No tracheal tenderness, no spinous process tenderness and no muscular tenderness present. No rigidity. No tracheal deviation, no edema, no erythema and normal range of motion present. No thyroid mass and no thyromegaly present.  Cardiovascular: Normal rate, regular rhythm, S1 normal, S2 normal, normal heart sounds and intact distal pulses.  PMI is not displaced.  Exam reveals no gallop and no friction  rub.   No murmur heard. Pulmonary/Chest: Effort normal and breath sounds normal. No accessory muscle usage or stridor. No respiratory distress. She has no decreased breath sounds. She has no wheezes. She has no rhonchi. She has no rales.  She exhibits no tenderness.  Abdominal: Soft. She exhibits no distension.  Musculoskeletal: Normal range of motion. She exhibits no edema or tenderness.       Right shoulder: Normal.       Left shoulder: Normal.       Right hip: Normal.       Left hip: Normal.       Right knee: Normal.       Left knee: Normal.       Cervical back: Normal.       Right hand: Normal.       Left hand: Normal.  Lymphadenopathy:       Head (right side): No submental, no submandibular, no tonsillar, no preauricular, no posterior auricular and no occipital adenopathy present.       Head (left side): No submental, no submandibular, no tonsillar, no preauricular, no posterior auricular and no occipital adenopathy present.    She has no cervical adenopathy.       Right cervical: No superficial cervical, no deep cervical and no posterior cervical adenopathy present.      Left cervical: No superficial cervical, no deep cervical and no posterior cervical adenopathy present.  Neurological: She is alert and oriented to person, place, and time. She has normal strength. She is not disoriented. She displays no atrophy and no tremor. No cranial nerve deficit or sensory deficit. She exhibits normal muscle tone. She displays no seizure activity. Coordination and gait normal. GCS eye subscore is 4. GCS verbal subscore is 5. GCS motor subscore is 6.  Skin: Skin is warm, dry and intact. No abrasion, no bruising, no burn, no ecchymosis, no laceration, no lesion, no petechiae and no rash noted. She is not diaphoretic. No cyanosis or erythema. No pallor. Nails show no clubbing.  Psychiatric: She has a normal mood and affect. Her speech is normal and behavior is normal. Judgment and thought content normal. Cognition and memory are normal.  Nursing note and vitals reviewed.   ED Course  Procedures (including critical care time)  Labs Review Labs Reviewed - No data to display  Imaging Review No results found.   Visual Acuity  Review  Right Eye Distance: 20/50 Left Eye Distance: 20/30 Bilateral Distance: 20/30  Right Eye Near:   Left Eye Near:    Bilateral Near:      Discussed with patient if continued or worsening headache to follow up for re-evaluation as elevated blood pressure in clinic today.  Has taken her blood pressure medications as prescribed and states has white coat hypertension "always high at doctor's office but not on my home machine".  Patient verbalized understanding of information/instructions, agreed with plan of care and had no further questions at this time.   MDM   1. Conjunctivitis of right eye   Rx erythromycin opthalmic apply 1/2 inch ribbon right eye BID x 7 to 10 days.  Ketotifen 1 gtt right eye BID prn itching.  Refresh gtts 1 right eye  TID prn dryness/irritation.  Cleared for work starting tomorrow unless still having eye discharge stay home until resolved.  Patient refused note.  Hygiene discussed. Patient to apply warm packs prn as directed.  Instructed patient to not rub eyes.  May need to wash pillowcases more frequently until  infection resolves.  May use over the counter eye drops/tears for pain/symptom relief.  Return to clinic if headache, fever greater than 100.75F, nausea/vomiting, purulent discharge/matting unable to open eye without using fingers after 24 hours of medication use, foreign body sensation, ciliary flush, worsening photophobia or vision.  ER if orbital edema/erythema/worst headache of her life. Call or return to clinic as needed if these symptoms worsen or fail to improve as anticipated.  Patient given [employer] handout on bacterial conjunctivitis.  Patient verbalized agreement and understanding of treatment plan.   P2:  Hand washing, avoid contact use-wear glasses.  Barbaraann Barthel, NP 02/17/16 1345

## 2016-02-17 NOTE — Discharge Instructions (Signed)
How to Use Eye Drops and Eye Ointments HOW TO APPLY EYE DROPS Follow these steps when applying eye drops:  Wash your hands.  Tilt your head back.  Put a finger under your eye and use it to gently pull your lower lid downward. Keep that finger in place.  Using your other hand, hold the dropper between your thumb and index finger.  Position the dropper just over the edge of the lower lid. Hold it as close to your eye as you can without touching the dropper to your eye.  Steady your hand. One way to do this is to lean your index finger against your brow.  Look up.  Slowly and gently squeeze one drop of medicine into your eye.  Close your eye.  Place a finger between your lower eyelid and your nose. Press gently for 2 minutes. This increases the amount of time that the medicine is exposed to the eye. It also reduces side effects that can develop if the drop gets into the bloodstream through the nose. HOW TO APPLY EYE OINTMENTS Follow these steps when applying eye ointments:  Wash your hands.  Put a finger under your eye and use it to gently pull your lower lid downward. Keep that finger in place.  Using your other hand, place the tip of the tube between your thumb and index finger with the remaining fingers braced against your cheek or nose.  Hold the tube just over the edge of your lower lid without touching the tube to your lid or eyeball.  Look up.  Line the inner part of your lower lid with ointment.  Gently pull up on your upper lid and look down. This will force the ointment to spread over the surface of the eye.  Release the upper lid.  If you can, close your eyes for 1-2 minutes. Do not rub your eyes. If you applied the ointment correctly, your vision will be blurry for a few minutes. This is normal. ADDITIONAL INFORMATION  Make sure to use the eye drops or ointment as told by your health care provider.  If you have been told to use both eye drops and an eye  ointment, apply the eye drops first, then wait 3-4 minutes before you apply the ointment.  Try not to touch the tip of the dropper or tube to your eye. A dropper or tube that has touched the eye can become contaminated.   This information is not intended to replace advice given to you by your health care provider. Make sure you discuss any questions you have with your health care provider.   Document Released: 12/21/2000 Document Revised: 01/29/2015 Document Reviewed: 09/10/2014 Elsevier Interactive Patient Education 2016 Elsevier Inc. Viral Conjunctivitis Viral conjunctivitis is an inflammation of the clear membrane that covers the white part of your eye and the inner surface of your eyelid (conjunctiva). The inflammation is caused by a viral infection. The blood vessels in the conjunctiva become inflamed, causing the eye to become red or pink, and often itchy. Viral conjunctivitis can easily be passed from one person to another (contagious). CAUSES  Viral conjunctivitis is caused by a virus. A virus is a type of contagious germ. It can be spread by touching objects that have been contaminated with the virus, such as doorknobs or towels.  SYMPTOMS  Symptoms of viral conjunctivitis may include:   Eye redness.  Tearing or watery eyes.  Itchy eyes.  Burning feeling in the eyes.  Clear drainage from the  eye.  Swollen eyelids.  A gritty feeling in the eye.  Light sensitivity. DIAGNOSIS  Viral conjunctivitis may be diagnosed with a medical history and physical exam. If you have discharge from your eye, the discharge may be tested to rule out other causes of conjunctivitis.  TREATMENT  Viral conjunctivitis does not respond to medicines that kill bacteria (antibiotics). Treatment for viral conjunctivitis is directed at stopping a bacterial infection from developing in addition to the viral infection. Treatment also aims to relieve your symptoms, such as itching. This may be done with  antihistamine drops or other eye medicines. HOME CARE INSTRUCTIONS  Take medicines only as directed by your health care provider.  Avoid touching or rubbing your eyes.  Apply a warm, clean washcloth to your eye for 10-20 minutes, 3-4 times per day.  If you wear contact lenses, do not wear them until the inflammation is gone and your health care provider says it is safe to wear them again. Ask your health care provider how to sterilize or replace your contact lenses before using them again. Wear glasses until you can resume wearing contacts.  Avoid wearing eye makeup until the inflammation is gone. Throw away any old eye cosmetics that may be contaminated.  Change or wash your pillowcase every day.  Do not share towels or washcloths. This may spread the infection.  Wash your hands often with soap and water. Use paper towels to dry your hands.  Gently wipe away any drainage from your eye with a warm, wet washcloth or a cotton ball.  Be very careful to avoid touching the edge of the eyelid with the eye drop bottle or ointment tube when applying medicines to the affected eye. This will stop you from spreading the infection to the other eye or to other people. SEEK MEDICAL CARE IF:   Your symptoms do not improve with treatment.  You have increased pain.  Your vision becomes blurry.  You have a fever.  You have facial pain, redness, or swelling.  You have new symptoms.  Your symptoms get worse.   This information is not intended to replace advice given to you by your health care provider. Make sure you discuss any questions you have with your health care provider.   Document Released: 12/05/2002 Document Revised: 03/08/2006 Document Reviewed: 06/26/2014 Elsevier Interactive Patient Education [employer]2016 Elsevier Inc.

## 2016-02-17 NOTE — ED Notes (Signed)
Right eye itching, yellow drainage, and awoke this am with right eye matted.

## 2016-09-29 ENCOUNTER — Ambulatory Visit
Admission: EM | Admit: 2016-09-29 | Discharge: 2016-09-29 | Disposition: A | Discharge status: Home / Self Care | Payer: Medicare Other | Attending: Family Medicine | Admitting: Family Medicine

## 2016-09-29 ENCOUNTER — Ambulatory Visit: Payer: Medicare Other

## 2016-09-29 DIAGNOSIS — J449 Chronic obstructive pulmonary disease, unspecified: Secondary | ICD-10-CM | POA: Insufficient documentation

## 2016-09-29 DIAGNOSIS — R05 Cough: Secondary | ICD-10-CM | POA: Diagnosis present

## 2016-09-29 DIAGNOSIS — J069 Acute upper respiratory infection, unspecified: Secondary | ICD-10-CM

## 2016-09-29 MED ORDER — FLUTICASONE PROPIONATE 50 MCG/ACT NA SUSP
2.0000 | Freq: Every day | NASAL | 0 refills | Status: DC
Start: 2016-09-29 — End: 2019-03-28

## 2016-09-29 MED ORDER — ALBUTEROL SULFATE HFA 108 (90 BASE) MCG/ACT IN AERS: 1.0000 | INHALATION_SPRAY | Freq: Four times a day (QID) | RESPIRATORY_TRACT | 0 refills | Status: DC | PRN

## 2016-09-29 MED ORDER — AZITHROMYCIN 250 MG PO TABS
ORAL_TABLET | ORAL | 0 refills | Status: DC
Start: 2016-09-29 — End: 2019-03-28

## 2016-09-29 NOTE — ED Triage Notes (Signed)
Pt c/o chest congestion and a cough since Christmas Day. The cough is productive.

## 2016-09-29 NOTE — ED Provider Notes (Signed)
CSN: [id number]     Arrival date & time 09/29/16  1017 History   First MD Initiated Contact with Patient 09/29/16 1142     Chief Complaint  Patient presents with  . Nasal Congestion   (Consider location/radiation/quality/duration/timing/severity/associated sxs/prior Treatment) HPI  This 83 year old female who presents with cough productive of yellow sputum chest congestion nasal congestion started approximately 8 days ago. He states that she is much more fatigued than what she normally is accustomed to. She has not been nearly as active due to this. Nonsmoker but did have secondhand smoke with her husband smoking for many years but he died 20 years ago. Nighttime is worse.time for her coughing.       Past Medical History:  Diagnosis Date  . Dysrhythmia    A-fib  . H/O aortic valve replacement   . Hyperlipidemia   . Hypertension    Past Surgical History:  Procedure Laterality Date  . aortic stenosos    . CATARACT EXTRACTION    . EYE SURGERY    . GLAUCOMA SURGERY    . PACEMAKER INSERTION Right   . PACEMAKER INSERTION Right 10/16/2015   Procedure: ICD GENERATOR CHANGE;  Surgeon: Marcina Millard, MD;  Location: [facility] ORS;  Service: Cardiovascular;  Laterality: Right;   History reviewed. No pertinent family history. Social History  Substance Use Topics  . Smoking status: Never Smoker  . Smokeless tobacco: Never Used  . Alcohol use Yes     Comment: wine occ   OB History    No data available     Review of Systems  Constitutional: Positive for activity change and fatigue. Negative for chills and fever.  HENT: Positive for congestion, postnasal drip and rhinorrhea.   Respiratory: Positive for cough. Negative for wheezing and stridor.   Cardiovascular: Negative for chest pain.  All other systems reviewed and are negative.   Allergies  Lisinopril; Amoxicillin; Betoptic [betaxolol]; Biaxin [clarithromycin]; Cephalexin; Furosemide; Gabapentin; Hydrochlorothiazide; Mydriacyl  [tropicamide]; Penicillins; and Naproxen  Home Medications   Prior to Admission medications   Medication Sig Start Date End Date Taking? Authorizing Provider  amLODipine (NORVASC) 5 MG tablet Take 5 mg by mouth daily.   Yes Historical Provider, MD  aspirin 81 MG tablet Take 81 mg by mouth daily.   Yes Historical Provider, MD  CALCIUM & MAGNESIUM CARBONATES PO Take 1 tablet by mouth daily.   Yes Historical Provider, MD  VITAMIN D, CHOLECALCIFEROL, PO Take 1 tablet by mouth daily.   Yes Historical Provider, MD  albuterol (PROVENTIL HFA;VENTOLIN HFA) 108 (90 Base) MCG/ACT inhaler Inhale 1-2 puffs into the lungs every 6 (six) hours as needed for wheezing or shortness of breath. 09/29/16   Lutricia Feil, PA-C  azithromycin (ZITHROMAX Z-PAK) 250 MG tablet Take as per package instructions 09/29/16   Lutricia Feil, PA-C  estradiol (ESTRACE) 0.1 MG/GM vaginal cream Place 1 Applicatorful vaginally at bedtime.    Historical Provider, MD  fluticasone (FLONASE) 50 MCG/ACT nasal spray Place 2 sprays into both nostrils daily. 09/29/16   Lutricia Feil, PA-C   Meds Ordered and Administered this Visit  Medications - No data to display  BP (!) 149/82 (BP Location: Right Arm)   Pulse 88   Temp 98.2 F (36.8 C) (Oral)   Resp 20   Ht 5\' 3"  (1.6 m)   Wt 135 lb (61.2 kg)   SpO2 98%   BMI 23.91 kg/m  No data found.   Physical Exam  Constitutional: She is oriented to person,  place, and time. She appears well-developed and well-nourished. No distress.  HENT:  Head: Normocephalic and atraumatic.  Right Ear: External ear normal.  Left Ear: External ear normal.  Mouth/Throat: Oropharynx is clear and moist.  Right TM appears very dark and all  Eyes: EOM are normal. Pupils are equal, round, and reactive to light.  Neck: Normal range of motion. Neck supple.  Pulmonary/Chest: Effort normal. No respiratory distress. She has no wheezes. She has rales.  Patient has non-tussive inspiratory crackles in the  left lower lobe.  Musculoskeletal: Normal range of motion.  Lymphadenopathy:    She has no cervical adenopathy.  Neurological: She is alert and oriented to person, place, and time.  Skin: Skin is warm and dry. She is not diaphoretic.  Psychiatric: She has a normal mood and affect. Her behavior is normal. Judgment and thought content normal.  Nursing note and vitals reviewed.   Urgent Care Course   Clinical Course     Procedures (including critical care time)  Labs Review Labs Reviewed - No data to display  Imaging Review Dg Chest 2 View  Result Date: 09/29/2016 CLINICAL DATA:  Chest 0 1 month ago, recurrent cough which is now productive and is associated with shortness of breath and decreased oxygen saturation. Patient also reports chest pressure sensation. History of aortic valve replacement and permanent pacemaker placement. EXAM: CHEST  2 VIEW COMPARISON:  PA and lateral chest x-ray of December 11, 2015 FINDINGS: The lungs are mildly hyperinflated and clear. The heart and pulmonary vascularity are normal. The sternal wires are intact. The ICD is in stable position. The prosthetic aortic valve ring appears stable. There is multilevel degenerative disc disease of the mid and lower thoracic spine. IMPRESSION: Chronic bronchitic-asthmatic changes. No evidence of pneumonia nor CHF. Electronically Signed   By: David  SwazilandJordan M.D.   On: 09/29/2016 12:30     Visual Acuity Review  Right Eye Distance:   Left Eye Distance:   Bilateral Distance:    Right Eye Near:   Left Eye Near:    Bilateral Near:         MDM   1. Upper respiratory tract infection, unspecified type    Discharge Medication List as of 09/29/2016 12:41 PM    START taking these medications   Details  azithromycin (ZITHROMAX Z-PAK) 250 MG tablet Take as per package instructions, Normal    fluticasone (FLONASE) 50 MCG/ACT nasal spray Place 2 sprays into both nostrils daily., Starting Tue 09/29/2016, Normal       Albuterol inhaler  Plan: 1. Test/x-ray results and diagnosis reviewed with patient 2. rx as per orders; risks, benefits, potential side effects reviewed with patient 3. Recommend supportive treatment with rest and fluids. Primary care if she is not improving or worsens 4. F/u prn if symptoms worsen or don't improve     Lutricia FeilWilliam P Roemer, PA-C 09/29/16 1247

## 2016-12-14 ENCOUNTER — Other Ambulatory Visit: Payer: Self-pay | Admitting: Obstetrics and Gynecology

## 2016-12-14 NOTE — Telephone Encounter (Signed)
Pt needs an appt. Denied refill until then. I let pharm know to inform pt

## 2016-12-15 ENCOUNTER — Telehealth: Payer: Self-pay

## 2016-12-15 NOTE — Telephone Encounter (Signed)
Pt called stating by error estrace refill request was sent to [facility] who ref her to [facility]  Pt will be sure Dr. Lula Olszewskionnolly get refill request.

## 2016-12-22 NOTE — Telephone Encounter (Signed)
This encounter was created in error - please disregard.

## 2017-01-28 ENCOUNTER — Emergency Department: Payer: Medicare Other

## 2017-01-28 ENCOUNTER — Ambulatory Visit (INDEPENDENT_AMBULATORY_CARE_PROVIDER_SITE_OTHER)
Admission: EM | Admit: 2017-01-28 | Discharge: 2017-01-28 | Disposition: A | Discharge status: Home / Self Care | Payer: Medicare Other | Source: Home / Self Care | Attending: Family Medicine | Admitting: Family Medicine

## 2017-01-28 ENCOUNTER — Ambulatory Visit: Payer: Medicare Other

## 2017-01-28 ENCOUNTER — Encounter: Payer: Self-pay | Admitting: *Deleted

## 2017-01-28 ENCOUNTER — Emergency Department
Admission: EM | Admit: 2017-01-28 | Discharge: 2017-01-28 | Disposition: A | Discharge status: Home / Self Care | Payer: Medicare Other | Attending: Emergency Medicine | Admitting: Emergency Medicine

## 2017-01-28 DIAGNOSIS — Z79899 Other long term (current) drug therapy: Secondary | ICD-10-CM | POA: Diagnosis not present

## 2017-01-28 DIAGNOSIS — Y999 Unspecified external cause status: Secondary | ICD-10-CM | POA: Insufficient documentation

## 2017-01-28 DIAGNOSIS — S50311A Abrasion of right elbow, initial encounter: Secondary | ICD-10-CM | POA: Diagnosis not present

## 2017-01-28 DIAGNOSIS — S8011XA Contusion of right lower leg, initial encounter: Secondary | ICD-10-CM | POA: Diagnosis not present

## 2017-01-28 DIAGNOSIS — Y939 Activity, unspecified: Secondary | ICD-10-CM | POA: Insufficient documentation

## 2017-01-28 DIAGNOSIS — Z7982 Long term (current) use of aspirin: Secondary | ICD-10-CM | POA: Insufficient documentation

## 2017-01-28 DIAGNOSIS — I10 Essential (primary) hypertension: Secondary | ICD-10-CM | POA: Insufficient documentation

## 2017-01-28 DIAGNOSIS — Y9239 Other specified sports and athletic area as the place of occurrence of the external cause: Secondary | ICD-10-CM | POA: Insufficient documentation

## 2017-01-28 DIAGNOSIS — S8991XA Unspecified injury of right lower leg, initial encounter: Secondary | ICD-10-CM | POA: Diagnosis present

## 2017-01-28 DIAGNOSIS — W1809XA Striking against other object with subsequent fall, initial encounter: Secondary | ICD-10-CM | POA: Diagnosis not present

## 2017-01-28 DIAGNOSIS — M7989 Other specified soft tissue disorders: Secondary | ICD-10-CM

## 2017-01-28 DIAGNOSIS — W010XXA Fall on same level from slipping, tripping and stumbling without subsequent striking against object, initial encounter: Secondary | ICD-10-CM

## 2017-01-28 DIAGNOSIS — M79604 Pain in right leg: Secondary | ICD-10-CM

## 2017-01-28 DIAGNOSIS — S81811A Laceration without foreign body, right lower leg, initial encounter: Secondary | ICD-10-CM

## 2017-01-28 DIAGNOSIS — T148XXA Other injury of unspecified body region, initial encounter: Secondary | ICD-10-CM

## 2017-01-28 LAB — CBC WITH DIFFERENTIAL/PLATELET
Basophils Absolute: 0.1 10*3/uL (ref 0–0.1)
Basophils Relative: 1 %
EOS ABS: 0.1 10*3/uL (ref 0–0.7)
EOS PCT: 1 %
HCT: 39.3 % (ref 35.0–47.0)
HEMOGLOBIN: 13.1 g/dL (ref 12.0–16.0)
LYMPHS ABS: 2.2 10*3/uL (ref 1.0–3.6)
Lymphocytes Relative: 18 %
MCH: 30.8 pg (ref 26.0–34.0)
MCHC: 33.4 g/dL (ref 32.0–36.0)
MCV: 92.4 fL (ref 80.0–100.0)
MONO ABS: 0.7 10*3/uL (ref 0.2–0.9)
Monocytes Relative: 6 %
Neutro Abs: 9.5 10*3/uL — ABNORMAL HIGH (ref 1.4–6.5)
Neutrophils Relative %: 74 %
Platelets: 292 10*3/uL (ref 150–440)
RBC: 4.26 MIL/uL (ref 3.80–5.20)
RDW: 12.6 % (ref 11.5–14.5)
WBC: 12.6 10*3/uL — AB (ref 3.6–11.0)

## 2017-01-28 LAB — BASIC METABOLIC PANEL
Anion gap: 10 (ref 5–15)
BUN: 20 mg/dL (ref 6–20)
CO2: 27 mmol/L (ref 22–32)
CREATININE: 1.21 mg/dL — AB (ref 0.44–1.00)
Calcium: 9.9 mg/dL (ref 8.9–10.3)
Chloride: 100 mmol/L — ABNORMAL LOW (ref 101–111)
GFR calc Af Amer: 46 mL/min — ABNORMAL LOW (ref >60)
GFR, EST NON AFRICAN AMERICAN: 40 mL/min — AB (ref >60)
GLUCOSE: 116 mg/dL — AB (ref 65–99)
Potassium: 3.9 mmol/L (ref 3.5–5.1)
SODIUM: 137 mmol/L (ref 135–145)

## 2017-01-28 MED ORDER — ACETAMINOPHEN 500 MG PO TABS
1000.0000 mg | ORAL_TABLET | Freq: Once | ORAL | Status: AC
  Administered 2017-01-28: 1000 mg via ORAL
  Filled 2017-01-28: qty 2

## 2017-01-28 MED ORDER — OXYCODONE-ACETAMINOPHEN 5-325 MG PO TABS
1.0000 | ORAL_TABLET | Freq: Once | ORAL | Status: AC
  Administered 2017-01-28: 1 via ORAL
  Filled 2017-01-28: qty 1

## 2017-01-28 MED ORDER — IOPAMIDOL (ISOVUE-300) INJECTION 61%
75.0000 mL | Freq: Once | INTRAVENOUS | Status: AC | PRN
  Administered 2017-01-28: 75 mL via INTRAVENOUS

## 2017-01-28 MED ORDER — MORPHINE SULFATE (PF) 2 MG/ML IV SOLN
2.0000 mg | Freq: Once | INTRAVENOUS | Status: AC
  Administered 2017-01-28: 2 mg via INTRAVENOUS
  Filled 2017-01-28: qty 1

## 2017-01-28 NOTE — ED Triage Notes (Signed)
FIRST NURSE: [facility] right lower leg skin tear.

## 2017-01-28 NOTE — ED Provider Notes (Signed)
[facility] Emergency Department Provider Note  ____________________________________________  Time seen: Approximately 5:34 PM  I have reviewed the triage vital signs and the nursing notes.   HISTORY  Chief Complaint Fall    HPI Shuntae Herzig is a 83 y.o. female who complains of worsening pain in the right lower leg. She reports that she was getting on a rowing exercise machine at the gym at 11:30 AM today when she fell onto the metal bar of the machine on her right shin and then fell onto the ground. No head injury loss of consciousness or neck pain. Since then she is having worsening pain in the right lower leg with swelling and bruising. She also reports that her right foot is feeling cold and tingly and numb in the last hour or so. She was initially evaluated at urgent care and sent over here with concern for compartment syndrome.  Pain is 8 out of 10. Nonradiating. Worse with any movement. No alleviating factors. Feels sharp.     Past Medical History:  Diagnosis Date  . Dysrhythmia    A-fib  . H/O aortic valve replacement   . Hyperlipidemia   . Hypertension      There are no active problems to display for this patient.    Past Surgical History:  Procedure Laterality Date  . aortic stenosos    . CATARACT EXTRACTION    . EYE SURGERY    . GLAUCOMA SURGERY    . PACEMAKER INSERTION Right   . PACEMAKER INSERTION Right 10/16/2015   Procedure: ICD GENERATOR CHANGE;  Surgeon: Marcina Millard, MD;  Location: [facility] ORS;  Service: Cardiovascular;  Laterality: Right;     Prior to Admission medications   Medication Sig Start Date End Date Taking? Authorizing Provider  albuterol (PROVENTIL HFA;VENTOLIN HFA) 108 (90 Base) MCG/ACT inhaler Inhale 1-2 puffs into the lungs every 6 (six) hours as needed for wheezing or shortness of breath. 09/29/16  Yes Lutricia Feil, PA-C  amLODipine (NORVASC) 5 MG tablet Take 5 mg by mouth daily.   Yes Historical  Provider, MD  aspirin 81 MG tablet Take 81 mg by mouth daily.   Yes Historical Provider, MD  CALCIUM & MAGNESIUM CARBONATES PO Take 1 tablet by mouth daily.   Yes Historical Provider, MD  estradiol (ESTRACE) 0.1 MG/GM vaginal cream Place 1 Applicatorful vaginally at bedtime.   Yes Historical Provider, MD  fluticasone (FLONASE) 50 MCG/ACT nasal spray Place 2 sprays into both nostrils daily. 09/29/16  Yes Lutricia Feil, PA-C  VITAMIN D, CHOLECALCIFEROL, PO Take 1 tablet by mouth daily.   Yes Historical Provider, MD  azithromycin (ZITHROMAX Z-PAK) 250 MG tablet Take as per package instructions Patient not taking: Reported on 01/28/2017 09/29/16   Lutricia Feil, PA-C     Allergies Lisinopril; Amoxicillin; Betoptic [betaxolol]; Biaxin [clarithromycin]; Cephalexin; Furosemide; Gabapentin; Hydrochlorothiazide; Mydriacyl [tropicamide]; Penicillins; and Naproxen   History reviewed. No pertinent family history.  Social History Social History  Substance Use Topics  . Smoking status: Never Smoker  . Smokeless tobacco: Never Used  . Alcohol use Yes     Comment: wine occ    Review of Systems  Constitutional:   No fever or chills.  ENT:   No sore throat. No rhinorrhea. Lymphatic: No swollen glands, No extremity swelling Endocrine: No hot/cold flashes. No significant weight change. No neck swelling. Cardiovascular:   No chest pain or syncope. Respiratory:   No dyspnea or cough. Gastrointestinal:   Negative for abdominal pain, vomiting  and diarrhea.  Genitourinary:   Negative for dysuria or difficulty urinating. Musculoskeletal:   Right leg pain and swelling as above Neurological:   Negative for headaches or weakness. Decreased sensation right lower leg. All other systems reviewed and are negative except as documented above in ROS and HPI.  ____________________________________________   PHYSICAL EXAM:  VITAL SIGNS: ED Triage Vitals  Enc Vitals Group     BP 01/28/17 1541 (!) 194/78      Pulse Rate 01/28/17 1541 86     Resp 01/28/17 1541 18     Temp 01/28/17 1541 97.6 F (36.4 C)     Temp Source 01/28/17 1541 Oral     SpO2 01/28/17 1541 100 %     Weight 01/28/17 1542 135 lb (61.2 kg)     Height 01/28/17 1542 5' (1.524 m)     Head Circumference --      Peak Flow --      Pain Score 01/28/17 1545 6     Pain Loc --      Pain Edu? --      Excl. in GC? --     Vital signs reviewed, nursing assessments reviewed.   Constitutional:   Alert and oriented. Well appearing and in no distress. Eyes:   No scleral icterus. No conjunctival pallor. PERRL. EOMI.  No nystagmus. ENT   Head:   Normocephalic and atraumatic.   Nose:   No congestion/rhinnorhea. No septal hematoma   Mouth/Throat:   MMM, no pharyngeal erythema. No peritonsillar mass.    Neck:   No stridor. No SubQ emphysema. No meningismus. Hematological/Lymphatic/Immunilogical:   No cervical lymphadenopathy. Cardiovascular:   RRR. Symmetric bilateral radial and DP And PT pulses.  No murmurs.  Respiratory:   Normal respiratory effort without tachypnea nor retractions. Breath sounds are clear and equal bilaterally. No wheezes/rales/rhonchi. Gastrointestinal:   Soft and nontender. Non distended. There is no CVA tenderness.  No rebound, rigidity, or guarding. Genitourinary:   deferred Musculoskeletal:   Abrasion right elbow without bony tenderness. Full range of motion in the arm. No hip tenderness. No knee tenderness or swelling. It is diffuse swelling in the right lower leg over the tibia. Most pronounced in the mid shaft where there is a large focal hematoma developing and significant tenderness. There is a skin tear in this area as well. The anterior tissues are somewhat tense, the posterior tissues are swollen but soft..  Capillary refill in the toes of the right foot is Normal and symmetric to the left, 2 seconds.  Neurologic:   Normal speech and language.  CN 2-10 normal. Motor grossly intact. Subjectively  diminished sensation in the volar aspect of the right foot. No gross focal neurologic deficits are appreciated.  Skin:    Skin is warm, dry with skin tear and bruising as above. No pallor or coldness of the right lower leg and foot..  ____________________________________________    LABS (pertinent positives/negatives) (all labs ordered are listed, but only abnormal results are displayed) Labs Reviewed  BASIC METABOLIC PANEL - Abnormal; Notable for the following:       Result Value   Chloride 100 (*)    Glucose, Bld 116 (*)    Creatinine, Ser 1.21 (*)    GFR calc non Af Amer 40 (*)    GFR calc Af Amer 46 (*)    All other components within normal limits  CBC WITH DIFFERENTIAL/PLATELET - Abnormal; Notable for the following:    WBC 12.6 (*)    Neutro  Abs 9.5 (*)    All other components within normal limits   ____________________________________________   EKG    ____________________________________________    RADIOLOGY  Dg Tibia/fibula Right  Result Date: 01/28/2017 CLINICAL DATA:  Pt states she tripped over equipment in gym today and hit lower leg on piece of equipment. Pain and bruising in right mid ant lower tibia into lower leg. Laceration/skin tear on lower 1/3rd of tibia. EXAM: RIGHT TIBIA AND FIBULA - 2 VIEW COMPARISON:  None. FINDINGS: There is no evidence of fracture or other focal bone lesions. Chondrocalcinosis of the medial and lateral femorotibial compartments as can be seen with CPPD. Soft tissue laceration involving the distal lateral right lower leg. IMPRESSION: No acute osseous injury of the right tibia or fibula. Electronically Signed   By: Elige Ko   On: 01/28/2017 13:43   Ct Tibia Fibula Right W Contrast  Result Date: 01/28/2017 CLINICAL DATA:  Patient fell, hitting right shin while at the gym. EXAM: CT OF THE LOWER RIGHT EXTREMITY WITH CONTRAST TECHNIQUE: Multidetector CT imaging of the lower right extremity was performed according to the standard protocol  following intravenous contrast administration. COMPARISON:  None. CONTRAST:  75mL ISOVUE-300 IOPAMIDOL (ISOVUE-300) INJECTION 61% FINDINGS: Bones/Joint/Cartilage No acute fracture or malalignment of the right knee, ankle, subtalar nor visualized midfoot articulations. There is chondrocalcinosis of hyaline cartilage within the knee joint. No significant joint effusion at the knee nor ankle. Ligaments Suboptimally assessed by CT. Muscles and Tendons No intramuscular hemorrhage, mass or atrophy. The visualized patellar tendon is grossly intact. The extensor and flexor tendons crossing the ankle joint are of normal morphology. Soft tissues Overlying the right anterior tibial and peroneal muscles is a subcutaneous hematoma measuring 7.7 cm craniocaudad by 2.5 cm transverse by 5.6 cm AP. Tiny focus of active hemorrhage is seen centrally. This hematoma is noted along the anterolateral aspect of the mid leg. Soft tissue abrasions and subcutaneous air are identified likely related to small lacerations along the anterolateral aspect of the mid leg. IMPRESSION: IMPRESSION 1. Subcutaneous hematoma with minimal active focus of hemorrhage along the anterolateral aspect of the mid right leg. The hematoma measures approximately 7.7 x 2.5 x 5.6 cm. Overlying soft tissue induration and laceration is also noted with small foci of subcutaneous air consistent with laceration. 2. No acute fracture or malalignment the tibia nor fibula. Electronically Signed   By: Tollie Eth M.D.   On: 01/28/2017 20:14    ____________________________________________   PROCEDURES Procedures  ____________________________________________   INITIAL IMPRESSION / ASSESSMENT AND PLAN / ED COURSE  Pertinent labs & imaging results that were available during my care of the patient were reviewed by me and considered in my medical decision making (see chart for details).    Clinical Course as of Jan 29 2144  Thu Jan 28, 2017  1733 Pw/ R shin  pain, swelling with progressing pain/cold sensation, paresthesia of Right foot. Will d/w ortho regarding possible compartment syndrome, obtain CT tib/fib.  [PS]  1805 Ortho paged a second time.   [PS]  1857 Still attempting to reach ortho. Secretary calling OR to see if they are in a procedure.   [PS]  2034 D/w ortho Dr. Odis Luster. Will review CT. Advises that this can be taken care of with close outpt follow up. Can see her in clinic tomorrow. Requests she call clinic 8am tomorrow. Advises against attempts to I&D the hematoma, recommends ace wrap, ice, elevation.   [PS]    Clinical Course User Index [PS]  Sharman Cheek, MD     ----------------------------------------- 9:45 PM on 01/28/2017 -----------------------------------------  After pain medicines patient reports that her foot symptoms are improved. Reassessment shows no significant change. Still normal capillary refill bilaterally. Still full pulses bilaterally. Posterior soft tissues are soft and nontender. No significant change in size of hematoma or swelling. Results reviewed, follow-up plan reviewed. Return precautions. Patient has Tylenol 3 at home which she can take as needed.  ____________________________________________   FINAL CLINICAL IMPRESSION(S) / ED DIAGNOSES  Final diagnoses:  Hematoma  Leg swelling  Right leg pain      New Prescriptions   No medications on file     Portions of this note were generated with dragon dictation software. Dictation errors may occur despite best attempts at proofreading.    Sharman Cheek, MD 01/28/17 2146

## 2017-01-28 NOTE — Discharge Instructions (Signed)
Call the orthopedics clinic in the morning to be seen same-day tomorrow.    CT scan report: FINDINGS: Bones/Joint/Cartilage   No acute fracture or malalignment of the right knee, ankle, subtalar nor visualized midfoot articulations. There is chondrocalcinosis of hyaline cartilage within the knee joint. No significant joint effusion at the knee nor ankle.   Ligaments   Suboptimally assessed by CT.   Muscles and Tendons   No intramuscular hemorrhage, mass or atrophy. The visualized patellar tendon is grossly intact. The extensor and flexor tendons crossing the ankle joint are of normal morphology.   Soft tissues   Overlying the right anterior tibial and peroneal muscles is a subcutaneous hematoma measuring 7.7 cm craniocaudad by 2.5 cm transverse by 5.6 cm AP. Tiny focus of active hemorrhage is seen centrally. This hematoma is noted along the anterolateral aspect of the mid leg. Soft tissue abrasions and subcutaneous air are identified likely related to small lacerations along the anterolateral aspect of the mid leg.   IMPRESSION: IMPRESSION 1. Subcutaneous hematoma with minimal active focus of hemorrhage along the anterolateral aspect of the mid right leg. The hematoma measures approximately 7.7 x 2.5 x 5.6 cm. Overlying soft tissue induration and laceration is also noted with small foci of subcutaneous air consistent with laceration. 2. No acute fracture or malalignment the tibia nor fibula.

## 2017-01-28 NOTE — ED Provider Notes (Signed)
CSN: [id number]     Arrival date & time 01/28/17  1200 History   First MD Initiated Contact with Patient 01/28/17 1259     Chief Complaint  Patient presents with  . Leg Injury    right   (Consider location/radiation/quality/duration/timing/severity/associated sxs/prior Treatment) HPI This a 83 year old female who presents with an injury to her right leg after she was at the gym this morning and attempting to mount a rowing machine when she misjudged tripped on the machine itself and fell striking her lower right leg and landing on her right buttock. He sustained a skin tear to the anterior tibia with swelling over the anterolateral aspect. Is able to walk on her leg from the gym to her car and drove here walking from her car into our facility.     Past Medical History:  Diagnosis Date  . Dysrhythmia    A-fib  . H/O aortic valve replacement   . Hyperlipidemia   . Hypertension    Past Surgical History:  Procedure Laterality Date  . aortic stenosos    . CATARACT EXTRACTION    . EYE SURGERY    . GLAUCOMA SURGERY    . PACEMAKER INSERTION Right   . PACEMAKER INSERTION Right 10/16/2015   Procedure: ICD GENERATOR CHANGE;  Surgeon: Marcina Millard, MD;  Location: [facility] ORS;  Service: Cardiovascular;  Laterality: Right;   History reviewed. No pertinent family history. Social History  Substance Use Topics  . Smoking status: Never Smoker  . Smokeless tobacco: Never Used  . Alcohol use Yes     Comment: wine occ   OB History    No data available     Review of Systems  Constitutional: Positive for activity change. Negative for appetite change, chills, fatigue and fever.  Musculoskeletal: Positive for gait problem.  Skin: Positive for wound.  All other systems reviewed and are negative.   Allergies  Lisinopril; Amoxicillin; Betoptic [betaxolol]; Biaxin [clarithromycin]; Cephalexin; Furosemide; Gabapentin; Hydrochlorothiazide; Mydriacyl [tropicamide]; Penicillins; and  Naproxen  Home Medications   Prior to Admission medications   Medication Sig Start Date End Date Taking? Authorizing Provider  amLODipine (NORVASC) 5 MG tablet Take 5 mg by mouth daily.   Yes Historical Provider, MD  aspirin 81 MG tablet Take 81 mg by mouth daily.   Yes Historical Provider, MD  azithromycin (ZITHROMAX Z-PAK) 250 MG tablet Take as per package instructions 09/29/16  Yes Lutricia Feil, PA-C  CALCIUM & MAGNESIUM CARBONATES PO Take 1 tablet by mouth daily.   Yes Historical Provider, MD  estradiol (ESTRACE) 0.1 MG/GM vaginal cream Place 1 Applicatorful vaginally at bedtime.   Yes Historical Provider, MD  fluticasone (FLONASE) 50 MCG/ACT nasal spray Place 2 sprays into both nostrils daily. 09/29/16  Yes Lutricia Feil, PA-C  VITAMIN D, CHOLECALCIFEROL, PO Take 1 tablet by mouth daily.   Yes Historical Provider, MD  albuterol (PROVENTIL HFA;VENTOLIN HFA) 108 (90 Base) MCG/ACT inhaler Inhale 1-2 puffs into the lungs every 6 (six) hours as needed for wheezing or shortness of breath. 09/29/16   Lutricia Feil, PA-C   Meds Ordered and Administered this Visit  Medications - No data to display  BP (!) 174/72 (BP Location: Left Arm)   Pulse 87   Temp 97.6 F (36.4 C) (Oral)   Resp 18   Ht 5' (1.524 m)   Wt 135 lb (61.2 kg)   SpO2 99%   BMI 26.37 kg/m  No data found.   Physical Exam  Constitutional: She is oriented  to person, place, and time. She appears well-developed and well-nourished. No distress.  HENT:  Head: Normocephalic and atraumatic.  Eyes: Pupils are equal, round, and reactive to light. Right eye exhibits no discharge. Left eye exhibits no discharge.  Neck: Normal range of motion. Neck supple.  Musculoskeletal: Normal range of motion. She exhibits edema and tenderness.  Examination of her right lower extremity shows a skin tear approximately 4 cm in length obliquely oriented over the anterior shin. The dermis is intact. She has marked bruising and hematoma just  superior and lateral to the skin tear. Is able to dorsiflex and plantarflex her ankle well. Range of motion is normal and comfortable. There is no evidence of injury. Right hip shows good range of motion with no discomfort at the extremes. Does have some tenderness in the right buttock at the sciatic notch area.  Reexamination at 3:07 PM as the leg circumference increasing with more ecchymosis present. Very firm and tender over the anterior tibial spine. She has some numbness around the hematoma and slightly distally although no numbness between the first and second toes on the dorsum and the patient is able to dorsi flex her foot against resistance. Does complain of numbness on the sole of her foot. Pulses remain a full on the posterior tibialis and dorsalis pedis. There is no pallor of the foot. Because of her increasing complaints of pain spite elevation and icing noted that the patient should go to the emergency department for further evaluation and observation for possible compartment syndrome. Her pain currently is 8 out of 10.  Neurological: She is alert and oriented to person, place, and time.  Skin: Skin is warm and dry. She is not diaphoretic. There is erythema.  Psychiatric: She has a normal mood and affect. Her behavior is normal. Judgment and thought content normal.  Nursing note and vitals reviewed.   Urgent Care Course     Procedures (including critical care time)  Labs Review Labs Reviewed - No data to display  Imaging Review Dg Tibia/fibula Right  Result Date: 01/28/2017 CLINICAL DATA:  Pt states she tripped over equipment in gym today and hit lower leg on piece of equipment. Pain and bruising in right mid ant lower tibia into lower leg. Laceration/skin tear on lower 1/3rd of tibia. EXAM: RIGHT TIBIA AND FIBULA - 2 VIEW COMPARISON:  None. FINDINGS: There is no evidence of fracture or other focal bone lesions. Chondrocalcinosis of the medial and lateral femorotibial compartments  as can be seen with CPPD. Soft tissue laceration involving the distal lateral right lower leg. IMPRESSION: No acute osseous injury of the right tibia or fibula. Electronically Signed   By: Elige KoHetal  Patel   On: 01/28/2017 13:43     Visual Acuity Review  Right Eye Distance:   Left Eye Distance:   Bilateral Distance:    Right Eye Near:   Left Eye Near:    Bilateral Near:         MDM   1. Contusion of right lower extremity, initial encounter   2. Skin tear of right lower leg without complication, initial encounter   3. Traumatic hematoma of right lower leg, initial encounter    She continued to complain of increasing pain despite elevation and ice. The pain has worsened daily since her admission into our facility. As of the continued swelling and increasing pain and complaints of numbness have recommended [facility] observation and treatment. Griebel to this. Her daughter  will transport her by privately owned vehicle. The patient was stable to time of her discharge.    Lutricia Feil, PA-C 01/28/17 1511

## 2017-01-28 NOTE — ED Notes (Signed)
Large amount of swelling around the area. Pt reports numbness after wound was unwrapped. Small amount of external bleeding.

## 2017-01-28 NOTE — ED Triage Notes (Addendum)
Pt was at the gym this morning and was trying to get onto the bike and she caught her leg and it caused her to fall and injure her right leg. She has a laceration and swelling to her right leg. And her right hip also hurts.

## 2017-01-28 NOTE — ED Triage Notes (Signed)
Pt reports falling at the gym and hitting right shin on workout equipment. Pt able to move leg without difficulty. Sensation intact. bruising and swelling noted around skin tear. Pt seen at urgent care and was sent to ED for further evaluation.   Pt denies having hi head. No LOC. Urgent care ruled out fracture with an xray. Pt does not take blood thinners.

## 2017-02-04 ENCOUNTER — Other Ambulatory Visit: Payer: Self-pay | Admitting: Physician Assistant

## 2017-02-04 ENCOUNTER — Ambulatory Visit
Admission: RE | Admit: 2017-02-04 | Discharge: 2017-02-04 | Disposition: A | Discharge status: Home / Self Care | Payer: Medicare Other | Source: Ambulatory Visit | Attending: Physician Assistant | Admitting: Physician Assistant

## 2017-02-04 DIAGNOSIS — R609 Edema, unspecified: Secondary | ICD-10-CM | POA: Insufficient documentation

## 2017-02-23 ENCOUNTER — Encounter: Payer: Medicare Other | Attending: Internal Medicine | Admitting: Internal Medicine

## 2017-02-23 DIAGNOSIS — S81811D Laceration without foreign body, right lower leg, subsequent encounter: Secondary | ICD-10-CM | POA: Diagnosis not present

## 2017-02-23 DIAGNOSIS — I87321 Chronic venous hypertension (idiopathic) with inflammation of right lower extremity: Secondary | ICD-10-CM | POA: Insufficient documentation

## 2017-02-23 DIAGNOSIS — I509 Heart failure, unspecified: Secondary | ICD-10-CM | POA: Diagnosis not present

## 2017-02-23 DIAGNOSIS — Z7982 Long term (current) use of aspirin: Secondary | ICD-10-CM | POA: Insufficient documentation

## 2017-02-23 DIAGNOSIS — M1990 Unspecified osteoarthritis, unspecified site: Secondary | ICD-10-CM | POA: Diagnosis not present

## 2017-02-23 DIAGNOSIS — I110 Hypertensive heart disease with heart failure: Secondary | ICD-10-CM | POA: Insufficient documentation

## 2017-02-23 DIAGNOSIS — W19XXXD Unspecified fall, subsequent encounter: Secondary | ICD-10-CM | POA: Insufficient documentation

## 2017-02-23 NOTE — Progress Notes (Signed)
[name], Mccullough ([id number]) Visit Report for 02/23/2017 Abuse/Suicide Risk Screen Details Patient Name: [name], [name] Date of Service: 02/23/2017 12:45 PM Medical Record Patient Account Number: [id number] Number: Treating RN: Clover Mealy, RN, BSN, Kajsa April 13, 1929 (83 y.o. Other Clinician: Date of Birth/Sex: Female) Treating ROBSON, MICHAEL Primary Care Elier Zellars: SYSTEM, PCP Glendola Friedhoff/Extender: G Referring Nathanial Arrighi: Myrtie Neither in Treatment: 0 Abuse/Suicide Risk Screen Items Answer ABUSE/SUICIDE RISK SCREEN: Has anyone close to you tried to hurt or harm you recentlyo No Do you feel uncomfortable with anyone in your familyo No Has anyone forced you do things that you didnot want to doo No Do you have any thoughts of harming yourselfo No Patient displays signs or symptoms of abuse and/or neglect. No Electronic Signature(s) Signed: 02/23/2017 3:29:18 PM By: Elpidio Eric BSN, RN Entered By: Elpidio Eric on 02/23/2017 13:04:35 Shell, Marcelline ([id number]) -------------------------------------------------------------------------------- Activities of Daily Living Details Patient Name: [name] Date of Service: 02/23/2017 12:45 PM Medical Record Patient Account Number: [id number] Number: Treating RN: Afful, RN, BSN, [place] Sink October 26, 1928 (83 y.o. Other Clinician: Date of Birth/Sex: Female) Treating ROBSON, MICHAEL Primary Care Fadil Macmaster: SYSTEM, PCP Adalynn Corne/Extender: G Referring Ariyon Gerstenberger: Myrtie Neither in Treatment: 0 Activities of Daily Living Items Answer Activities of Daily Living (Please select one for each item) Drive Automobile Completely Able Take Medications Completely Able Use Telephone Completely Able Care for Appearance Completely Able Use Toilet Completely Able Bath / Shower Completely Able Dress Self Completely Able Feed Self Completely Able Walk Completely Able Get In / Out Bed Completely Able Housework Completely Able Prepare Meals Completely  Able Handle Money Completely Able Shop for Self Completely Able Electronic Signature(s) Signed: 02/23/2017 3:29:18 PM By: Elpidio Eric BSN, RN Entered By: Elpidio Eric on 02/23/2017 13:04:56 Hooper, Klaire ([id number]) -------------------------------------------------------------------------------- Education Assessment Details Patient Name: [name] Date of Service: 02/23/2017 12:45 PM Medical Record Patient Account Number: [id number] Number: Treating RN: Clover Mealy, RN, BSN, [place] 1929/01/18 (83 y.o. Other Clinician: Date of Birth/Sex: Female) Treating ROBSON, MICHAEL Primary Care Albertha Beattie: SYSTEM, PCP Suetta Hoffmeister/Extender: G Referring Jafari Mckillop: Myrtie Neither in Treatment: 0 Primary Learner Assessed: Patient Learning Preferences/Education Level/Primary Language Highest Education Level: College or Above Preferred Language: English Cognitive Barrier Assessment/Beliefs Language Barrier: No Physical Barrier Assessment Impaired Vision: No Impaired Hearing: No Decreased Hand dexterity: No Knowledge/Comprehension Assessment Knowledge Level: High Comprehension Level: High Ability to understand written High instructions: Ability to understand verbal High instructions: Motivation Assessment Anxiety Level: Calm Cooperation: Cooperative Education Importance: Acknowledges Need Interest in Health Problems: Asks Questions Perception: Coherent Willingness to Engage in Self- High Management Activities: Readiness to Engage in Self- High Management Activities: Electronic Signature(s) Signed: 02/23/2017 3:29:18 PM By: Elpidio Eric BSN, RN Entered By: Elpidio Eric on 02/23/2017 13:05:17 Orbach, Doria ([id number]) -------------------------------------------------------------------------------- Fall Risk Assessment Details Patient Name: [name] Date of Service: 02/23/2017 12:45 PM Medical Record Patient Account Number: [id number] Number: Treating RN: Clover Mealy, RN,  BSN, Gracieann 08-28-1929 (83 y.o. Other Clinician: Date of Birth/Sex: Female) Treating ROBSON, MICHAEL Primary Care Taariq Leitz: SYSTEM, PCP Perri Lamagna/Extender: G Referring Kamrin Spath: Myrtie Neither in Treatment: 0 Fall Risk Assessment Items Have you had 2 or more falls in the last 12 monthso 0 Yes Have you had any fall that resulted in injury in the last 12 monthso 0 Yes FALL RISK ASSESSMENT: History of falling - immediate or within 3 months 25 Yes Secondary diagnosis 0 No Ambulatory aid None/bed rest/wheelchair/nurse 0 Yes Crutches/cane/walker 0 No Furniture 0 No IV Access/Saline Lock 0 No Gait/Training Normal/bed rest/immobile 0 Yes Weak  0 No Impaired 0 No Mental Status Oriented to own ability 0 Yes Electronic Signature(s) Signed: 02/23/2017 3:29:18 PM By: Elpidio EricAfful, Jessenia BSN, RN Entered By: Elpidio EricAfful, [place] on 02/23/2017 13:05:28 Dunagan, Ernesteen ([id number]) -------------------------------------------------------------------------------- Foot Assessment Details Patient Name: [name] CollumBERMAN, [name] Date of Service: 02/23/2017 12:45 PM Medical Record Patient Account Number: [id number] Number: Treating RN: Clover MealyAfful, RN, BSN, Nina Sinkita 08/23/1929 (83 y.o. Other Clinician: Date of Birth/Sex: Female) Treating ROBSON, MICHAEL Primary Care Corda Shutt: SYSTEM, PCP Erina Hamme/Extender: G Referring Bellina Tokarczyk: Myrtie NeitherJONES, MAURICE Weeks in Treatment: 0 Foot Assessment Items Site Locations + = Sensation present, - = Sensation absent, C = Callus, U = Ulcer R = Redness, W = Warmth, M = Maceration, PU = Pre-ulcerative lesion F = Fissure, S = Swelling, D = Dryness Assessment Right: Left: Other Deformity: No No Prior Foot Ulcer: No No Prior Amputation: No No Charcot Joint: No No Ambulatory Status: Ambulatory Without Help Gait: Steady Electronic Signature(s) Signed: 02/23/2017 3:29:18 PM By: Elpidio EricAfful, Gorgeous BSN, RN Entered By: Elpidio EricAfful, Ziyana on 02/23/2017 13:05:43 Parlato, Estel  ([id number]) -------------------------------------------------------------------------------- Nutrition Risk Assessment Details Patient Name: [name] CollumBERMAN, Berlynn Date of Service: 02/23/2017 12:45 PM Medical Record Patient Account Number: [id number] Number: Treating RN: Clover MealyAfful, RN, BSN, [place] 04/06/1929 (83 y.o. Other Clinician: Date of Birth/Sex: Female) Treating ROBSON, MICHAEL Primary Care Angelice Piech: SYSTEM, PCP Miriam Kestler/Extender: G Referring Siddhartha Hoback: Altamese CabalJONES, MAURICE Weeks in Treatment: 0 Height (in): 64 Weight (lbs): 138 Body Mass Index (BMI): 23.7 Nutrition Risk Assessment Items NUTRITION RISK SCREEN: I have an illness or condition that made me change the kind and/or 0 No amount of food I eat I eat fewer than two meals per day 0 No I eat few fruits and vegetables, or milk products 0 No I have three or more drinks of beer, liquor or wine almost every day 0 No I have tooth or mouth problems that make it hard for me to eat 0 No I don't always have enough money to buy the food I need 0 No I eat alone most of the time 0 No I take three or more different prescribed or over-the-counter drugs a 0 No day Without wanting to, I have lost or gained 10 pounds in the last six 0 No months I am not always physically able to shop, cook and/or feed myself 0 No Nutrition Protocols Good Risk Protocol 0 No interventions needed Moderate Risk Protocol Electronic Signature(s) Signed: 02/23/2017 3:29:18 PM By: Elpidio EricAfful, Mayley BSN, RN Entered By: Elpidio EricAfful, Janara on 02/23/2017 13:05:35

## 2017-02-24 NOTE — Progress Notes (Signed)
TANJA, GIFT ([id number]) Visit Report for 02/23/2017 Allergy List Details Patient Name: [name], [name] Date of Service: 02/23/2017 12:45 PM Medical Record Number: [id number] Patient Account Number: [id number] Date of Birth/Sex: 02/08/1929 (83 y.o. Female) Treating RN: Clover Mealy, RN, BSN, [employer] Primary Care Khyrie Masi: SYSTEM, PCP Other Clinician: Referring Miriam Kestler: Altamese Cabal Treating Lanard Arguijo/Extender: Maxwell Caul Weeks in Treatment: 0 Allergies Active Allergies PCN Allergy Notes Electronic Signature(s) Signed: 02/23/2017 3:29:18 PM By: Elpidio Eric BSN, RN Entered By: Elpidio Eric on 02/23/2017 13:13:21 [name], [name] ([id number]) -------------------------------------------------------------------------------- Arrival Information Details Patient Name: [name] Date of Service: 02/23/2017 12:45 PM Medical Record Number: [id number] Patient Account Number: [id number] Date of Birth/Sex: [birth year] (83 y.o. Female) Treating RN: Afful, RN, BSN, [employer] Primary Care Cinnamon Morency: SYSTEM, PCP Other Clinician: Referring Quirino Kakos: Altamese Cabal Treating Faythe Heitzenrater/Extender: Altamese Frederickson in Treatment: 0 Visit Information Patient Arrived: Ambulatory Arrival Time: 13:00 Accompanied By: dtr Transfer Assistance: None Patient Identification Verified: Yes Secondary Verification Process Yes Completed: Patient Requires Transmission-Based No Precautions: Patient Has Alerts: No Electronic Signature(s) Signed: 02/23/2017 3:29:18 PM By: Elpidio Eric BSN, RN Entered By: Elpidio Eric on 02/23/2017 13:03:25 [name], [name] ([id number]) -------------------------------------------------------------------------------- Clinic Level of Care Assessment Details Patient Name: [name] Date of Service: 02/23/2017 12:45 PM Medical Record Number: [id number] Patient Account Number: [id number] Date of Birth/Sex: 02/18/1929 (83 y.o. Female) Treating RN: Afful, RN, BSN, [employer] Primary Care Aryn Kops: SYSTEM,  PCP Other Clinician: Referring Toma Erichsen: Altamese Cabal Treating Ruhi Kopke/Extender: Altamese Ossun in Treatment: 0 Clinic Level of Care Assessment Items TOOL 1 Quantity Score []  - Use when EandM and Procedure is performed on INITIAL visit 0 ASSESSMENTS - Nursing Assessment / Reassessment X - General Physical Exam (combine w/ comprehensive assessment (listed just 1 20 below) when performed on new pt. evals) X - Comprehensive Assessment (HX, ROS, Risk Assessments, Wounds Hx, etc.) 1 25 ASSESSMENTS - Wound and Skin Assessment / Reassessment []  - Dermatologic / Skin Assessment (not related to wound area) 0 ASSESSMENTS - Ostomy and/or Continence Assessment and Care []  - Incontinence Assessment and Management 0 []  - Ostomy Care Assessment and Management (repouching, etc.) 0 PROCESS - Coordination of Care X - Simple Patient / Family Education for ongoing care 1 15 []  - Complex (extensive) Patient / Family Education for ongoing care 0 X - Staff obtains Chiropractor, Records, Test Results / Process Orders 1 10 X - Staff telephones HHA, Nursing Homes / Clarify orders / etc 1 10 []  - Routine Transfer to another Facility (non-emergent condition) 0 []  - Routine Hospital Admission (non-emergent condition) 0 X - New Admissions / Manufacturing engineer / Ordering NPWT, Apligraf, etc. 1 15 []  - Emergency Hospital Admission (emergent condition) 0 PROCESS - Special Needs []  - Pediatric / Minor Patient Management 0 []  - Isolation Patient Management 0 [name], [name] ([id number]) []  - Hearing / Language / Visual special needs 0 []  - Assessment of Community assistance (transportation, D/C planning, etc.) 0 []  - Additional assistance / Altered mentation 0 []  - Support Surface(s) Assessment (bed, cushion, seat, etc.) 0 INTERVENTIONS - Miscellaneous []  - External ear exam 0 []  - Patient Transfer (multiple staff / Nurse, adult / Similar devices) 0 []  - Simple Staple / Suture removal (25 or less) 0 []  -  Complex Staple / Suture removal (26 or more) 0 []  - Hypo/Hyperglycemic Management (do not check if billed separately) 0 X - Ankle / Brachial Index (ABI) - do not check if billed separately 1 15 Has the patient been seen at the hospital within the  last three years: Yes Total Score: 110 Level Of Care: New/Established - Level 3 Electronic Signature(s) Signed: 02/23/2017 3:29:18 PM By: Elpidio EricAfful, Clytie BSN, RN Entered By: Elpidio EricAfful, Emmilia on 02/23/2017 14:36:49 [name], [name] ([id number]) -------------------------------------------------------------------------------- Encounter Discharge Information Details Patient Name: [name] CollumBERMAN, Ebonye Date of Service: 02/23/2017 12:45 PM Medical Record Number: [id number] Patient Account Number: [id number] Date of Birth/Sex: 10/22/1928 (83 y.o. Female) Treating RN: Afful, RN, BSN, [employer] Primary Care Swathi Dauphin: SYSTEM, PCP Other Clinician: Referring Kryslyn Helbig: Altamese CabalJONES, MAURICE Treating Niraj Kudrna/Extender: Altamese CarolinaOBSON, MICHAEL G Weeks in Treatment: 0 Encounter Discharge Information Items Discharge Pain Level: 0 Discharge Condition: Stable Ambulatory Status: Cane Discharge Destination: Home Transportation: Private Auto Accompanied By: DTR Schedule Follow-up Appointment: No Medication Reconciliation completed No and provided to Patient/Care Loring Liskey: Provided on Clinical Summary of Care: 02/23/2017 Form Type Recipient Paper Patient RB Electronic Signature(s) Signed: 02/23/2017 3:29:18 PM By: Elpidio EricAfful, Karolyn BSN, RN Previous Signature: 02/23/2017 2:33:24 PM Version By: Gwenlyn PerkingMoore, Shelia Entered By: Elpidio EricAfful, Dalicia on 02/23/2017 14:37:59 [name], [name] ([id number]) -------------------------------------------------------------------------------- Lower Extremity Assessment Details Patient Name: [name] CollumBERMAN, Gregory Date of Service: 02/23/2017 12:45 PM Medical Record Number: [id number] Patient Account Number: [id number] Date of Birth/Sex: 03/05/1929 (83 y.o. Female) Treating RN: Afful, RN, BSN,  [employer] Primary Care Audi Wettstein: SYSTEM, PCP Other Clinician: Referring Ordell Prichett: Altamese CabalJONES, MAURICE Treating Hayde Kilgour/Extender: Altamese CarolinaOBSON, MICHAEL G Weeks in Treatment: 0 Edema Assessment Assessed: [Left: No] [Right: No] Edema: [Left: No] [Right: Yes] Calf Left: Right: Point of Measurement: 28 cm From Medial Instep 34 cm 36 cm Ankle Left: Right: Point of Measurement: 8 cm From Medial Instep 23.1 cm 22 cm Vascular Assessment Claudication: Claudication Assessment [Left:None] [Right:None] Pulses: Dorsalis Pedis Palpable: [Left:Yes] [Right:Yes] Doppler Audible: [Left:Yes] [Right:Yes] Posterior Tibial Extremity colors, hair growth, and conditions: Extremity Color: [Left:Normal] [Right:Mottled] Hair Growth on Extremity: [Left:Yes] [Right:Yes] Temperature of Extremity: [Left:Warm] [Right:Warm] Capillary Refill: [Left:< 3 seconds] [Right:< 3 seconds] Blood Pressure: Brachial: [Left:190] [Right:197] Dorsalis Pedis: 215 [Left:Dorsalis Pedis: 220] Ankle: Posterior Tibial: [Left:Posterior Tibial: 1.09] [Right:1.12] Toe Nail Assessment Left: Right: Thick: No No Discolored: No No Deformed: No No Improper Length and Hygiene: No No [name], [name] ([id number]) Electronic Signature(s) Signed: 02/23/2017 3:29:18 PM By: Elpidio EricAfful, Keileigh BSN, RN Entered By: Elpidio EricAfful, Gaye on 02/23/2017 13:38:27 [name], [name] ([id number]) -------------------------------------------------------------------------------- Multi Wound Chart Details Patient Name: [name] CollumBERMAN, Karyl Date of Service: 02/23/2017 12:45 PM Medical Record Number: [id number] Patient Account Number: [id number] Date of Birth/Sex: 12/25/1928 (83 y.o. Female) Treating RN: Afful, RN, BSN, [employer] Primary Care Willadene Mounsey: SYSTEM, PCP Other Clinician: Referring Ishaan Villamar: Altamese CabalJONES, MAURICE Treating Candelaria Pies/Extender: Altamese CarolinaOBSON, MICHAEL G Weeks in Treatment: 0 Vital Signs Height(in): 64 Pulse(bpm): 73 Weight(lbs): 138 Blood Pressure 197/78 (mmHg): Body Mass Index(BMI):  24 Temperature(F): 97.5 Respiratory Rate 18 (breaths/min): Photos: [1:No Photos] [N/A:N/A] Wound Location: [1:Right Lower Leg - Anterior N/A] Wounding Event: [1:Trauma] [N/A:N/A] Primary Etiology: [1:Trauma, Other] [N/A:N/A] Comorbid History: [1:Cataracts, Arrhythmia, Congestive Heart Failure, Hypertension, Osteoarthritis] [N/A:N/A] Date Acquired: [1:01/28/2017] [N/A:N/A] Weeks of Treatment: [1:0] [N/A:N/A] Wound Status: [1:Open] [N/A:N/A] Measurements L x W x D 2.5x5x3 [N/A:N/A] (cm) Area (cm) : [1:9.817] [N/A:N/A] Volume (cm) : [1:29.452] [N/A:N/A] % Reduction in Area: [1:-13.60%] [N/A:N/A] % Reduction in Volume: -13.60% [N/A:N/A] Classification: [1:Full Thickness With Exposed Support Structures] [N/A:N/A] Exudate Amount: [1:Large] [N/A:N/A] Exudate Type: [1:Serosanguineous] [N/A:N/A] Exudate Color: [1:red, brown] [N/A:N/A] Wound Margin: [1:Flat and Intact] [N/A:N/A] Granulation Amount: [1:None Present (0%)] [N/A:N/A] Necrotic Amount: [1:Large (67-100%)] [N/A:N/A] Necrotic Tissue: [1:Eschar, Adherent Slough N/A] Exposed Structures: [1:Fat Layer (Subcutaneous N/A Tissue) Exposed: Yes Muscle: Yes] Fascia: No Tendon: No Joint: No Bone: No Epithelialization: None N/A N/A  Debridement: Debridement (40981- N/A N/A 11047) Pre-procedure 13:57 N/A N/A Verification/Time Out Taken: Pain Control: Lidocaine 4% Topical N/A N/A Solution Tissue Debrided: Necrotic/Eschar, N/A N/A Fibrin/Slough, Muscle, Fat, Blood Clots, Exudates, Subcutaneous Level: Skin/Subcutaneous N/A N/A Tissue/Muscle Debridement Area (sq 11 N/A N/A cm): Instrument: Blade, Curette, Forceps N/A N/A Bleeding: Large N/A N/A Hemostasis Achieved: Pressure N/A N/A Procedural Pain: 0 N/A N/A Post Procedural Pain: 0 N/A N/A Debridement Treatment Procedure was tolerated N/A N/A Response: well Post Debridement 2.2x5x3 N/A N/A Measurements L x W x D (cm) Post Debridement 25.918 N/A N/A Volume:  (cm) Periwound Skin Texture: Excoriation: No N/A N/A Induration: No Callus: No Crepitus: No Rash: No Scarring: No Periwound Skin Maceration: No N/A N/A Moisture: Dry/Scaly: No Periwound Skin Color: Erythema: Yes N/A N/A Mottled: Yes Atrophie Blanche: No Cyanosis: No Ecchymosis: No Hemosiderin Staining: No Pallor: No Rubor: No Erythema Location: Circumferential N/A N/A Temperature: No Abnormality N/A N/A [name], [name] ([id number]) Tenderness on Yes N/A N/A Palpation: Wound Preparation: Ulcer Cleansing: N/A N/A Rinsed/Irrigated with Saline Topical Anesthetic Applied: Other: lidocaine 4% Procedures Performed: Debridement N/A N/A Treatment Notes Electronic Signature(s) Signed: 02/23/2017 5:12:21 PM By: Baltazar Najjar MD Entered By: Baltazar Najjar on 02/23/2017 14:36:58 [name], [name] ([id number]) -------------------------------------------------------------------------------- Multi-Disciplinary Care Plan Details Patient Name: [name] Date of Service: 02/23/2017 12:45 PM Medical Record Number: [id number] Patient Account Number: [id number] Date of Birth/Sex: 29-Aug-1929 (83 y.o. Female) Treating RN: Afful, RN, BSN, [employer] Primary Care Alann Avey: SYSTEM, PCP Other Clinician: Referring Emmanuelle Hibbitts: Altamese Cabal Treating Kaizer Dissinger/Extender: Altamese Harrod in Treatment: 0 Active Inactive ` Abuse / Safety / Falls / Self Care Management Nursing Diagnoses: History of Falls Impaired home maintenance Impaired physical mobility Potential for falls Potential for injury related to transfers Goals: Patient will not develop complications from immobility Date Initiated: 02/23/2017 Target Resolution Date: 07/26/2017 Goal Status: Active Patient will not experience any injury related to falls Date Initiated: 02/23/2017 Target Resolution Date: 07/26/2017 Goal Status: Active Patient will remain injury free related to falls Date Initiated: 02/23/2017 Target Resolution Date:  07/26/2017 Goal Status: Active Patient/caregiver will demonstrate safe use of adaptive devices to increase mobility Date Initiated: 02/23/2017 Target Resolution Date: 07/26/2017 Goal Status: Active Patient/caregiver will identify factors that restrict self-care and home management Date Initiated: 02/23/2017 Target Resolution Date: 07/26/2017 Goal Status: Active Patient/caregiver will verbalize understanding of skin care regimen Date Initiated: 02/23/2017 Target Resolution Date: 07/26/2017 Goal Status: Active Patient/caregiver will verbalize understanding of the importance to maintain current immunizations/vaccinations Date Initiated: 02/23/2017 Target Resolution Date: 07/26/2017 Goal Status: Active Patient/caregiver will verbalize/demonstrate measure taken to improve self care Date Initiated: 02/23/2017 Target Resolution Date: 07/26/2017 [name], [name] ([id number]) Goal Status: Active Patient/caregiver will verbalize/demonstrate measures taken to improve the patient's personal safety Date Initiated: 02/23/2017 Target Resolution Date: 07/26/2017 Goal Status: Active Patient/caregiver will verbalize/demonstrate measures taken to prevent injury and/or falls Date Initiated: 02/23/2017 Target Resolution Date: 07/26/2017 Goal Status: Active Patient/caregiver will verbalize/demonstrate understanding of what to do in case of emergency Date Initiated: 02/23/2017 Target Resolution Date: 07/26/2017 Goal Status: Active Interventions: Call light and/or bell within patient's reach Assess Activities of Daily Living upon admission and as needed Assess fall risk on admission and as needed Assess: immobility, friction, shearing, incontinence upon admission and as needed Assess impairment of mobility on admission and as needed per policy Assess personal safety and home safety (as indicated) on admission and as needed Assess self care needs on admission and as needed Provide education on basic  hygiene Provide education on fall prevention Provide  education on personal and home safety Provide education on safe transfers Notes: ` Orientation to the Wound Care Program Nursing Diagnoses: Knowledge deficit related to the wound healing center program Goals: Patient/caregiver will verbalize understanding of the Wound Healing Center Program Date Initiated: 02/23/2017 Target Resolution Date: 07/26/2017 Goal Status: Active Interventions: Provide education on orientation to the wound center Notes: ` Wound/Skin Impairment Nursing Diagnoses: [name], [name] ([id number]) Impaired tissue integrity Knowledge deficit related to ulceration/compromised skin integrity Goals: Patient/caregiver will verbalize understanding of skin care regimen Date Initiated: 02/23/2017 Target Resolution Date: 07/26/2017 Goal Status: Active Ulcer/skin breakdown will have a volume reduction of 30% by week 4 Date Initiated: 02/23/2017 Target Resolution Date: 07/26/2017 Goal Status: Active Ulcer/skin breakdown will have a volume reduction of 50% by week 8 Date Initiated: 02/23/2017 Target Resolution Date: 07/26/2017 Goal Status: Active Ulcer/skin breakdown will have a volume reduction of 80% by week 12 Date Initiated: 02/23/2017 Target Resolution Date: 07/26/2017 Goal Status: Active Ulcer/skin breakdown will heal within 14 weeks Date Initiated: 02/23/2017 Target Resolution Date: 07/26/2017 Goal Status: Active Interventions: Assess patient/caregiver ability to obtain necessary supplies Assess patient/caregiver ability to perform ulcer/skin care regimen upon admission and as needed Assess ulceration(s) every visit Provide education on ulcer and skin care Treatment Activities: Referred to DME Augustina Braddock for dressing supplies : 02/23/2017 Skin care regimen initiated : 02/23/2017 Topical wound management initiated : 02/23/2017 Notes: Electronic Signature(s) Signed: 02/23/2017 3:29:18 PM By: Elpidio Eric BSN,  RN Entered By: Elpidio Eric on 02/23/2017 14:03:11 [name], [name] ([id number]) -------------------------------------------------------------------------------- Pain Assessment Details Patient Name: [name] Date of Service: 02/23/2017 12:45 PM Medical Record Number: [id number] Patient Account Number: [id number] Date of Birth/Sex: 12-23-1928 (83 y.o. Female) Treating RN: Afful, RN, BSN, [employer] Primary Care Gabriana Wilmott: SYSTEM, PCP Other Clinician: Referring Janiyha Montufar: Altamese Cabal Treating Aiden Helzer/Extender: Altamese Avera in Treatment: 0 Active Problems Location of Pain Severity and Description of Pain Patient Has Paino No Site Locations With Dressing Change: No Pain Management and Medication Current Pain Management: Electronic Signature(s) Signed: 02/23/2017 3:29:18 PM By: Elpidio Eric BSN, RN Entered By: Elpidio Eric on 02/23/2017 13:03:33 [name], [name] ([id number]) -------------------------------------------------------------------------------- Patient/Caregiver Education Details Patient Name: [name] Date of Service: 02/23/2017 12:45 PM Medical Record Patient Account Number: [id number] Number: Treating RN: Clover Mealy, RN, BSN, [place] 1929-01-24 (83 y.o. Other Clinician: Date of Birth/Gender: Female) Treating ROBSON, MICHAEL Primary Care Physician: SYSTEM, PCP Physician/Extender: G Referring Physician: Myrtie Neither in Treatment: 0 Education Assessment Education Provided To: Patient and Caregiver dtr Education Topics Provided Basic Hygiene: Methods: Explain/Verbal Responses: State content correctly Safety: Methods: Explain/Verbal Responses: State content correctly Welcome To The Wound Care Center: Methods: Explain/Verbal Responses: State content correctly Wound Debridement: Methods: Explain/Verbal Responses: State content correctly Wound/Skin Impairment: Methods: Explain/Verbal Responses: State content correctly Electronic Signature(s) Signed:  02/23/2017 3:29:18 PM By: Elpidio Eric BSN, RN Entered By: Elpidio Eric on 02/23/2017 14:38:32 Spindle, Tangia ([id number]) -------------------------------------------------------------------------------- Wound Assessment Details Patient Name: [name] Date of Service: 02/23/2017 12:45 PM Medical Record Number: [id number] Patient Account Number: [id number] Date of Birth/Sex: Oct 07, 1928 (83 y.o. Female) Treating RN: Afful, RN, BSN, [employer] Primary Care Kymberlee Viger: SYSTEM, PCP Other Clinician: Referring Claiborne Stroble: Altamese Cabal Treating Darneshia Demary/Extender: Maxwell Caul Weeks in Treatment: 0 Wound Status Wound Number: 1 Primary Trauma, Other Etiology: Wound Location: Right Lower Leg - Anterior Wound Open Wounding Event: Trauma Status: Date Acquired: 01/28/2017 Comorbid Cataracts, Arrhythmia, Congestive Weeks Of Treatment: 0 History: Heart Failure, Hypertension, Clustered Wound: No Osteoarthritis Photos Wound Measurements Length: (cm) 2.5 Width: (cm) 5 Depth: (  cm) 3 Area: (cm) 9.817 Volume: (cm) 29.452 % Reduction in Area: 0% % Reduction in Volume: 0% Epithelialization: None Tunneling: No Undermining: No Wound Description Full Thickness With Exposed Classification: Support Structures Wound Margin: Flat and Intact Exudate Large Amount: Exudate Type: Serosanguineous Exudate Color: red, brown Foul Odor After Cleansing: No Slough/Fibrino Yes Wound Bed Granulation Amount: None Present (0%) Exposed Structure Necrotic Amount: Large (67-100%) Fascia Exposed: No Necrotic Quality: Eschar, Adherent Slough Fat Layer (Subcutaneous Tissue) Exposed: Yes Floyd, Arnitra ([id number]) Tendon Exposed: No Muscle Exposed: Yes Necrosis of Muscle: Yes Joint Exposed: No Bone Exposed: No Periwound Skin Texture Texture Color No Abnormalities Noted: No No Abnormalities Noted: No Callus: No Atrophie Blanche: No Crepitus: No Cyanosis: No Excoriation: No Ecchymosis: No Induration:  No Erythema: Yes Rash: No Erythema Location: Circumferential Scarring: No Hemosiderin Staining: No Mottled: Yes Moisture Pallor: No No Abnormalities Noted: No Rubor: No Dry / Scaly: No Maceration: No Temperature / Pain Temperature: No Abnormality Tenderness on Palpation: Yes Wound Preparation Ulcer Cleansing: Rinsed/Irrigated with Saline Topical Anesthetic Applied: Other: lidocaine 4%, Treatment Notes Wound #1 (Right, Anterior Lower Leg) 1. Cleansed with: Clean wound with Normal Saline 3. Peri-wound Care: Barrier cream 4. Dressing Applied: Santyl Ointment 5. Secondary Dressing Applied ABD Pad Dry Gauze 7. Secured with 3 Layer Compression System - Right Lower Extremity Electronic Signature(s) Signed: 02/23/2017 4:14:11 PM By: Elpidio Eric BSN, RN Previous Signature: 02/23/2017 3:29:18 PM Version By: Elpidio Eric BSN, RN Entered By: Elpidio Eric on 02/23/2017 16:14:10 Shiller, Kendy ([id number]) -------------------------------------------------------------------------------- Vitals Details Patient Name: [name] Date of Service: 02/23/2017 12:45 PM Medical Record Number: [id number] Patient Account Number: [id number] Date of Birth/Sex: 1929-01-08 (83 y.o. Female) Treating RN: Afful, RN, BSN, [employer] Primary Care Gedalia Mcmillon: SYSTEM, PCP Other Clinician: Referring Mckinnley Cottier: Altamese Cabal Treating Loveda Colaizzi/Extender: Altamese Winfield in Treatment: 0 Vital Signs Time Taken: 13:04 Temperature (F): 97.5 Height (in): 64 Pulse (bpm): 73 Source: Stated Respiratory Rate (breaths/min): 18 Weight (lbs): 138 Blood Pressure (mmHg): 197/78 Source: Measured Reference Range: 80 - 120 mg / dl Body Mass Index (BMI): 23.7 Electronic Signature(s) Signed: 02/23/2017 3:29:18 PM By: Elpidio Eric BSN, RN Entered By: Elpidio Eric on 02/23/2017 13:04:21

## 2017-02-24 NOTE — Progress Notes (Addendum)
[name] ([id number]) Visit Report for 02/23/2017 Chief Complaint Document Details Patient Name: [name], [name] Date of Service: 02/23/2017 12:45 PM Medical Record Patient Account Number: [id number] Number: Treating RN: Clover Mealy, RN, BSN, Nichol Sep 03, 1929 (83 y.o. Other Clinician: Date of Birth/Sex: Female) Treating Kathrynne Kulinski Primary Care Provider: SYSTEM, PCP Provider/Extender: G Referring Provider: Myrtie Neither in Treatment: 0 Information Obtained from: Patient Chief Complaint 02/23/17; patient is here for review of wound on the right anterior lower leg Electronic Signature(s) Signed: 02/23/2017 5:12:21 PM By: Baltazar Najjar MD Entered By: Baltazar Najjar on 02/23/2017 14:38:55 Scarano, Shatha ([id number]) -------------------------------------------------------------------------------- Debridement Details Patient Name: [name] Date of Service: 02/23/2017 12:45 PM Medical Record Patient Account Number: [id number] Number: Treating RN: Clover Mealy, RN, BSN, Sherlyn 10-May-1929 (83 y.o. Other Clinician: Date of Birth/Sex: Female) Treating Tavonna Worthington Primary Care Provider: SYSTEM, PCP Provider/Extender: G Referring Provider: Myrtie Neither in Treatment: 0 Debridement Performed for Wound #1 Right,Anterior Lower Leg Assessment: Performed By: Physician Maxwell Caul, MD Debridement: Debridement Pre-procedure Verification/Time Out Yes - 13:57 Taken: Start Time: 13:57 Pain Control: Lidocaine 4% Topical Solution Level: Skin/Subcutaneous Tissue/Muscle Total Area Debrided (L x 2.2 (cm) x 5 (cm) = 11 (cm) W): Tissue and other Non-Viable, Blood Clots, Eschar, Exudate, Fat, Fibrin/Slough, Muscle, material debrided: Subcutaneous Instrument: Blade, Curette, Forceps Bleeding: Large Hemostasis Achieved: Pressure End Time: 14:04 Procedural Pain: 0 Post Procedural Pain: 0 Response to Treatment: Procedure was tolerated well Post Debridement  Measurements of Total Wound Length: (cm) 2.2 Width: (cm) 5 Depth: (cm) 3 Volume: (cm) 25.918 Character of Wound/Ulcer Post Requires Further Debridement Debridement: Post Procedure Diagnosis Same as Pre-procedure Electronic Signature(s) Signed: 02/23/2017 3:29:18 PM By: Elpidio Eric BSN, RN Signed: 02/23/2017 5:12:21 PM By: Baltazar Najjar MD Entered By: Baltazar Najjar on 02/23/2017 14:38:27 Goeden, Schae ([id number]) Selinda Orion, Sreya ([id number]) -------------------------------------------------------------------------------- HPI Details Patient Name: [name] Date of Service: 02/23/2017 12:45 PM Medical Record Patient Account Number: [id number] Number: Treating RN: Clover Mealy, RN, BSN, Morine [birth year] (83 y.o. Other Clinician: Date of Birth/Sex: Female) Treating Burns Timson Primary Care Provider: SYSTEM, PCP Provider/Extender: G Referring Provider: Myrtie Neither in Treatment: 0 History of Present Illness HPI Description: 02/23/17 this is an 83 year old woman who is at a fitness club about to sit down on a rowing machine. She fell and traumatized the anterior part of her right leg on a sharp metal surface. This caused immediate pain and bleeding. She stopped off on her way home at an urgent care ultimately was referred to the ER and by that time she had a significant hematoma of her right leg. She was followed in the urgent orthopedic clinic. She had a duplex ultrasound of the right leg on 5/10 that was negative for DVT x-ray of the right leg on 01/28/17 was negative including a CT scan of the tib-fib. The CT scan did show the subcutaneous hematoma which on 01/28/17 measured 7.7 cm craniocaudal by 2.5 cm x 5.6 cm AP subcutaneous air was also identified felt likely secondary to small lacerations. There was no acute fracture. The patient has been continuing mostly with topical antibiotics on this wound. She did not have any compression. She is referred here from the urgent  orthopedic clinic for review of this. She did not have a wound history she is not a diabetic. ABI in this clinic on the right was 1.1 on the left 1.0. She is on aspirin as her only anti-coagulant Electronic Signature(s) Signed: 02/23/2017 5:12:21 PM By: Baltazar Najjar MD Entered By: Leanord Hawking,  Casimiro Needle on 02/23/2017 14:42:32 Breelyn, Icard Kristien ([id number]) -------------------------------------------------------------------------------- Physical Exam Details Patient Name: [name] Date of Service: 02/23/2017 12:45 PM Medical Record Patient Account Number: [id number] Number: Treating RN: Clover Mealy, RN, BSN, [place]burton Sink [birth year] (83 y.o. Other Clinician: Date of Birth/Sex: Female) Treating Gabriel Conry Primary Care Provider: SYSTEM, PCP Provider/Extender: G Referring Provider: Myrtie Neither in Treatment: 0 Constitutional Patient is hypertensive.. Pulse regular and within target range for patient.Marland Kitchen Respirations regular, non-labored and within target range.. Temperature is normal and within the target range for the patient.Marland Kitchen appears in no distress. Respiratory Respiratory effort is easy and symmetric bilaterally. Rate is normal at rest and on room air.. Bilateral breath sounds are clear and equal in all lobes with no wheezes, rales or rhonchi.. Cardiovascular Heart rhythm and rate regular, without murmur or gallop.. Femoral arteries without bruits and pulses strong.. Pedal pulses palpable and strong bilaterally.. No edema. Gastrointestinal (GI) Abdomen is soft and non-distended without masses or tenderness. Bowel sounds active in all quadrants.. Lymphatic Nonpalpable in the popliteal or inguinal area. Integumentary (Hair, Skin) Mild changes of chronic venous insufficiency. Psychiatric No evidence of depression, anxiety, or agitation. Calm, cooperative, and communicative. Appropriate interactions and affect.. Notes Wound exam; the patient arrived with a black eschar that it  already opened and several spots. Using pickups and scalpel I remove the surface eschar. Then using an open curet we removed copious amounts of necrotic material including subcutaneous, fat and muscle. Unfortunately this is a deep wound. Post debridement there is still going to be further need for further material removed before we can get a healthy wound base. There is no evidence of surrounding infection at this point which is indeed fortunate. Electronic Signature(s) Signed: 02/23/2017 5:12:21 PM By: Baltazar Najjar MD Entered By: Baltazar Najjar on 02/23/2017 14:45:15 Mcwhirt, Genelle ([id number]) -------------------------------------------------------------------------------- Physician Orders Details Patient Name: [name] Date of Service: 02/23/2017 12:45 PM Medical Record Patient Account Number: [id number] Number: Treating RN: Clover Mealy, RN, BSN, Sherley October 21, 1928 (83 y.o. Other Clinician: Date of Birth/Sex: Female) Treating Shynice Sigel Primary Care Provider: SYSTEM, PCP Provider/Extender: G Referring Provider: Myrtie Neither in Treatment: 0 Verbal / Phone Orders: No Diagnosis Coding Wound Cleansing Wound #1 Right,Anterior Lower Leg o Cleanse wound with mild soap and water o May shower with protection. - MAY USE CAST PROTECTOR OR A BIG TRASH BAG OVER THE LEG AND SEAL IT OFF WITH A TAPE . DO NOT GET IT WET o No tub bath. Anesthetic Wound #1 Right,Anterior Lower Leg o Topical Lidocaine 4% cream applied to wound bed prior to debridement Primary Wound Dressing Wound #1 Right,Anterior Lower Leg o Santyl Ointment Secondary Dressing Wound #1 Right,Anterior Lower Leg o ABD pad o Dry Gauze Dressing Change Frequency Wound #1 Right,Anterior Lower Leg o Change dressing every week Follow-up Appointments Wound #1 Right,Anterior Lower Leg o Return Appointment in 1 week. Edema Control Wound #1 Right,Anterior Lower Leg o 3 Layer Compression System  - Right Lower Extremity Osbourn, Colletta ([id number]) Additional Orders / Instructions Wound #1 Right,Anterior Lower Leg o Increase protein intake. o Activity as tolerated Medications-please add to medication list. Wound #1 Right,Anterior Lower Leg o Santyl Enzymatic Ointment Electronic Signature(s) Signed: 02/23/2017 3:29:18 PM By: Elpidio Eric BSN, RN Signed: 02/23/2017 5:12:21 PM By: Baltazar Najjar MD Entered By: Elpidio Eric on 02/23/2017 14:15:29 Tarkowski, Tamia ([id number]) -------------------------------------------------------------------------------- Problem List Details Patient Name: [name] Date of Service: 02/23/2017 12:45 PM Medical Record Patient Account Number: [id number] Number: Treating RN: Clover Mealy, RN, BSN, Lee Sink [birth year] (83  y.o. Other Clinician: Date of Birth/Sex: Female) Treating [name] Aydin Primary Care Provider: SYSTEM, PCP Provider/Extender: G Referring Provider: Myrtie Neither in Treatment: 0 Active Problems ICD-10 Encounter Code Description Active Date Diagnosis S81.811D Laceration without foreign body, right lower leg, 02/23/2017 Yes subsequent encounter I87.321 Chronic venous hypertension (idiopathic) with 02/23/2017 Yes inflammation of right lower extremity Inactive Problems Resolved Problems Electronic Signature(s) Signed: 02/23/2017 5:12:21 PM By: Baltazar Najjar MD Entered By: Baltazar Najjar on 02/23/2017 14:36:51 Reininger, Shaily ([id number]) -------------------------------------------------------------------------------- Progress Note Details Patient Name: [name] Date of Service: 02/23/2017 12:45 PM Medical Record Patient Account Number: [id number] Number: Treating RN: Clover Mealy, RN, BSN, Miliyah 05/24/1929 (83 y.o. Other Clinician: Date of Birth/Sex: Female) Treating Kendre Jacinto Primary Care Provider: SYSTEM, PCP Provider/Extender: G Referring Provider: Myrtie Neither in Treatment: 0 Subjective Chief  Complaint Information obtained from Patient 02/23/17; patient is here for review of wound on the right anterior lower leg History of Present Illness (HPI) 02/23/17 this is an 83 year old woman who is at a fitness club about to sit down on a rowing machine. She fell and traumatized the anterior part of her right leg on a sharp metal surface. This caused immediate pain and bleeding. She stopped off on her way home at an urgent care ultimately was referred to the ER and by that time she had a significant hematoma of her right leg. She was followed in the urgent orthopedic clinic. She had a duplex ultrasound of the right leg on 5/10 that was negative for DVT x-ray of the right leg on 01/28/17 was negative including a CT scan of the tib-fib. The CT scan did show the subcutaneous hematoma which on 01/28/17 measured 7.7 cm craniocaudal by 2.5 cm x 5.6 cm AP subcutaneous air was also identified felt likely secondary to small lacerations. There was no acute fracture. The patient has been continuing mostly with topical antibiotics on this wound. She did not have any compression. She is referred here from the urgent orthopedic clinic for review of this. She did not have a wound history she is not a diabetic. ABI in this clinic on the right was 1.1 on the left 1.0. She is on aspirin as her only anti-coagulant Wound History Patient presents with 1 open wound that has been present for approximately 3 weeks. Patient has been treating wound in the following manner: neosporin. Laboratory tests have been performed in the last month. Patient reportedly has not tested positive for an antibiotic resistant organism. Patient reportedly has not tested positive for osteomyelitis. Patient reportedly has not had testing performed to evaluate circulation in the legs. Patient experiences the following problems associated with their wounds: infection, swelling. Patient History Information obtained from  Patient. Allergies PCN Family History Heart Disease - Mother, Paternal Grandparents, Hypertension - Siblings, Stroke - Paternal Grandparents, Father, No family history of Cancer, Diabetes, Hereditary Spherocytosis, Kidney Disease, Lung Disease, Seizures, Matich, Shadavia ([id number]) Thyroid Problems, Tuberculosis. Social History Never smoker, Marital Status - Widowed, Alcohol Use - Moderate, Drug Use - No History, Caffeine Use - Moderate. Medical History Eyes Patient has history of Cataracts - surgery to remove Ear/Nose/Mouth/Throat Denies history of Chronic sinus problems/congestion, Middle ear problems Hematologic/Lymphatic Denies history of Anemia, Hemophilia, Human Immunodeficiency Virus, Lymphedema, Sickle Cell Disease Respiratory Denies history of Aspiration, Asthma, Chronic Obstructive Pulmonary Disease (COPD), Pneumothorax, Sleep Apnea, Tuberculosis Cardiovascular Patient has history of Arrhythmia, Congestive Heart Failure, Hypertension Gastrointestinal Denies history of Cirrhosis , Colitis, Crohn s, Hepatitis A, Hepatitis B, Hepatitis C Endocrine Denies history  of Type I Diabetes, Type II Diabetes Genitourinary Denies history of End Stage Renal Disease Immunological Denies history of Lupus Erythematosus, Raynaud s, Scleroderma Musculoskeletal Patient has history of Osteoarthritis Neurologic Denies history of Dementia, Neuropathy, Quadriplegia, Paraplegia, Seizure Disorder Oncologic Denies history of Received Chemotherapy, Received Radiation Medical And Surgical History Notes Cardiovascular Pace maker Review of Systems (ROS) Constitutional Symptoms (General Health) The patient has no complaints or symptoms. Eyes The patient has no complaints or symptoms. Ear/Nose/Mouth/Throat The patient has no complaints or symptoms. Hematologic/Lymphatic The patient has no complaints or symptoms. Respiratory The patient has no complaints or symptoms. Cardiovascular The  patient has no complaints or symptoms. Gastrointestinal Pytel, Raechelle ([id number]) The patient has no complaints or symptoms. Endocrine The patient has no complaints or symptoms. Genitourinary The patient has no complaints or symptoms. Immunological The patient has no complaints or symptoms. Integumentary (Skin) Complains or has symptoms of Wounds, Swelling. Musculoskeletal The patient has no complaints or symptoms. Neurologic The patient has no complaints or symptoms. Oncologic The patient has no complaints or symptoms. Psychiatric Complains or has symptoms of Anxiety. Objective Constitutional Patient is hypertensive.. Pulse regular and within target range for patient.Marland Kitchen. Respirations regular, non-labored and within target range.. Temperature is normal and within the target range for the patient.Marland Kitchen. appears in no distress. Vitals Time Taken: 1:04 PM, Height: 64 in, Source: Stated, Weight: 138 lbs, Source: Measured, BMI: 23.7, Temperature: 97.5 F, Pulse: 73 bpm, Respiratory Rate: 18 breaths/min, Blood Pressure: 197/78 mmHg. Respiratory Respiratory effort is easy and symmetric bilaterally. Rate is normal at rest and on room air.. Bilateral breath sounds are clear and equal in all lobes with no wheezes, rales or rhonchi.. Cardiovascular Heart rhythm and rate regular, without murmur or gallop.. Femoral arteries without bruits and pulses strong.. Pedal pulses palpable and strong bilaterally.. No edema. Gastrointestinal (GI) Abdomen is soft and non-distended without masses or tenderness. Bowel sounds active in all quadrants.. Lymphatic Nonpalpable in the popliteal or inguinal area. Psychiatric No evidence of depression, anxiety, or agitation. Calm, cooperative, and communicative. Appropriate Heberle, Meygan ([id number]) interactions and affect.. General Notes: Wound exam; the patient arrived with a black eschar that it already opened and several spots. Using pickups and scalpel I remove  the surface eschar. Then using an open curet we removed copious amounts of necrotic material including subcutaneous, fat and muscle. Unfortunately this is a deep wound. Post debridement there is still going to be further need for further material removed before we can get a healthy wound base. There is no evidence of surrounding infection at this point which is indeed fortunate. Integumentary (Hair, Skin) Mild changes of chronic venous insufficiency. Wound #1 status is Open. Original cause of wound was Trauma. The wound is located on the Right,Anterior Lower Leg. The wound measures 2.5cm length x 5cm width x 3cm depth; 9.817cm^2 area and 29.452cm^3 volume. There is muscle and Fat Layer (Subcutaneous Tissue) Exposed exposed. There is no tunneling or undermining noted. There is a large amount of serosanguineous drainage noted. The wound margin is flat and intact. There is no granulation within the wound bed. There is a large (67-100%) amount of necrotic tissue within the wound bed including Eschar, Adherent Slough and Necrosis of Muscle. The periwound skin appearance exhibited: Mottled, Erythema. The periwound skin appearance did not exhibit: Callus, Crepitus, Excoriation, Induration, Rash, Scarring, Dry/Scaly, Maceration, Atrophie Blanche, Cyanosis, Ecchymosis, Hemosiderin Staining, Pallor, Rubor. The surrounding wound skin color is noted with erythema which is circumferential. Periwound temperature was noted as No Abnormality. The periwound  has tenderness on palpation. Assessment Active Problems ICD-10 S81.811D - Laceration without foreign body, right lower leg, subsequent encounter I87.321 - Chronic venous hypertension (idiopathic) with inflammation of right lower extremity Procedures Wound #1 Pre-procedure diagnosis of Wound #1 is a Trauma, Other located on the Right,Anterior Lower Leg . There was a Skin/Subcutaneous Tissue/Muscle Debridement ([id number]) debridement with total area of  11 sq cm performed by Maxwell Caul, MD. with the following instrument(s): Blade, Curette, and Forceps to remove Non-Viable tissue/material including Blood Clots, Exudate, Fat Layer (and Subcutaneous Tissue) Exposed, Fibrin/Slough, Muscle, Eschar, and Subcutaneous after achieving pain control using Lidocaine 4% Topical Solution. A time out was conducted at 13:57, prior to the start of the procedure. A Large amount Bona, Brihanna ([id number]) of bleeding was controlled with Pressure. The procedure was tolerated well with a pain level of 0 throughout and a pain level of 0 following the procedure. Post Debridement Measurements: 2.2cm length x 5cm width x 3cm depth; 25.918cm^3 volume. Character of Wound/Ulcer Post Debridement requires further debridement. Post procedure Diagnosis Wound #1: Same as Pre-Procedure Plan Wound Cleansing: Wound #1 Right,Anterior Lower Leg: Cleanse wound with mild soap and water May shower with protection. - MAY USE CAST PROTECTOR OR A BIG TRASH BAG OVER THE LEG AND SEAL IT OFF WITH A TAPE . DO NOT GET IT WET No tub bath. Anesthetic: Wound #1 Right,Anterior Lower Leg: Topical Lidocaine 4% cream applied to wound bed prior to debridement Primary Wound Dressing: Wound #1 Right,Anterior Lower Leg: Santyl Ointment Secondary Dressing: Wound #1 Right,Anterior Lower Leg: ABD pad Dry Gauze Dressing Change Frequency: Wound #1 Right,Anterior Lower Leg: Change dressing every week Follow-up Appointments: Wound #1 Right,Anterior Lower Leg: Return Appointment in 1 week. Edema Control: Wound #1 Right,Anterior Lower Leg: 3 Layer Compression System - Right Lower Extremity Additional Orders / Instructions: Wound #1 Right,Anterior Lower Leg: Increase protein intake. Activity as tolerated Medications-please add to medication list.: Wound #1 Right,Anterior Lower Leg: Santyl Enzymatic Ointment Batten, Rhya ([id number]) #1 original very large hematoma which on CT scan  measured 7.7 x 2.5 x 5.6. The patient did not have any ongoing compression and was only using topical antibiotics. The eschar at already started to separate therefore was removed, copious amounts relatively of necrotic material removed from the wound base. This is going to require ongoing debridement both mechanically and with Santyl #2 changes of chronic venous insufficiency hopefully eventually will allow use of Apligraf once we can get to a viable wound bed. Electronic Signature(s) Signed: 02/26/2017 10:20:33 AM By: Elliot Gurney, BSN, RN, CWS, Kim RN, BSN Signed: 03/05/2017 5:56:02 PM By: Baltazar Najjar MD Previous Signature: 02/23/2017 5:12:21 PM Version By: Baltazar Najjar MD Entered By: Elliot Gurney BSN, RN, CWS, Kim on 02/26/2017 10:20:32 [name] ([id number]) -------------------------------------------------------------------------------- ROS/PFSH Details Patient Name: [name] Date of Service: 02/23/2017 12:45 PM Medical Record Patient Account Number: [id number] Number: Treating RN: Clover Mealy, RN, BSN, Emanuel 07-14-1929 (83 y.o. Other Clinician: Date of Birth/Sex: Female) Treating Tommie Dejoseph Primary Care Provider: SYSTEM, PCP Provider/Extender: G Referring Provider: Myrtie Neither in Treatment: 0 Information Obtained From Patient Wound History Do you currently have one or more open woundso Yes How many open wounds do you currently haveo 1 Approximately how long have you had your woundso 3 weeks How have you been treating your wound(s) until nowo neosporin Has your wound(s) ever healed and then re-openedo No Have you had any lab work done in the past montho Yes Who ordered the lab work doneo [facility] Have you tested  positive for an antibiotic resistant organism (MRSA, VRE)o No Have you tested positive for osteomyelitis (bone infection)o No Have you had any tests for circulation on your legso No Have you had other problems associated with your woundso Infection,  Swelling Integumentary (Skin) Complaints and Symptoms: Positive for: Wounds; Swelling Psychiatric Complaints and Symptoms: Positive for: Anxiety Constitutional Symptoms (General Health) Complaints and Symptoms: No Complaints or Symptoms Eyes Complaints and Symptoms: No Complaints or Symptoms Medical History: Positive for: Cataracts - surgery to remove Ear/Nose/Mouth/Throat Yasui, Kerissa ([id number]) Complaints and Symptoms: No Complaints or Symptoms Medical History: Negative for: Chronic sinus problems/congestion; Middle ear problems Hematologic/Lymphatic Complaints and Symptoms: No Complaints or Symptoms Medical History: Negative for: Anemia; Hemophilia; Human Immunodeficiency Virus; Lymphedema; Sickle Cell Disease Respiratory Complaints and Symptoms: No Complaints or Symptoms Medical History: Negative for: Aspiration; Asthma; Chronic Obstructive Pulmonary Disease (COPD); Pneumothorax; Sleep Apnea; Tuberculosis Cardiovascular Complaints and Symptoms: No Complaints or Symptoms Medical History: Positive for: Arrhythmia; Congestive Heart Failure; Hypertension Past Medical History Notes: Visual merchandiser Gastrointestinal Complaints and Symptoms: No Complaints or Symptoms Medical History: Negative for: Cirrhosis ; Colitis; Crohnos; Hepatitis A; Hepatitis B; Hepatitis C Endocrine Complaints and Symptoms: No Complaints or Symptoms Medical History: Negative for: Type I Diabetes; Type II Diabetes Genitourinary Nikkel, Reyonna ([id number]) Complaints and Symptoms: No Complaints or Symptoms Medical History: Negative for: End Stage Renal Disease Immunological Complaints and Symptoms: No Complaints or Symptoms Medical History: Negative for: Lupus Erythematosus; Raynaudos; Scleroderma Musculoskeletal Complaints and Symptoms: No Complaints or Symptoms Medical History: Positive for: Osteoarthritis Neurologic Complaints and Symptoms: No Complaints or Symptoms Medical  History: Negative for: Dementia; Neuropathy; Quadriplegia; Paraplegia; Seizure Disorder Oncologic Complaints and Symptoms: No Complaints or Symptoms Medical History: Negative for: Received Chemotherapy; Received Radiation HBO Extended History Items Eyes: Cataracts Immunizations Pneumococcal Vaccine: Received Pneumococcal Vaccination: Yes Family and Social History Cancer: No; Diabetes: No; Heart Disease: Yes - Mother, Paternal Grandparents; Hereditary Spherocytosis: No; Hypertension: Yes - Siblings; Kidney Disease: No; Lung Disease: No; Seizures: No; Stroke: Yes - Paternal Grandparents, Father; Thyroid Problems: No; Tuberculosis: No; Never smoker; Marital Status - Widowed; Alcohol Use: Moderate; Drug Use: No History; Caffeine Use: Moderate; Financial Concerns: No; Clopper, Emelie ([id number]) Food, Clothing or Shelter Needs: No; Support System Lacking: No; Transportation Concerns: No; Advanced Directives: Yes; Living Will: Yes; Medical Power of Attorney: Yes - Collene Leyden Electronic Signature(s) Signed: 02/23/2017 3:29:18 PM By: Elpidio Eric BSN, RN Signed: 02/23/2017 5:12:21 PM By: Baltazar Najjar MD Entered By: Elpidio Eric on 02/23/2017 13:17:49 Wohlford, Avaree ([id number]) -------------------------------------------------------------------------------- SuperBill Details Patient Name: [name] Date of Service: 02/23/2017 Medical Record Patient Account Number: [id number] Number: Treating RN: Clover Mealy, RN, BSN, Kamoni 02/03/1929 (83 y.o. Other Clinician: Date of Birth/Sex: Female) Treating Langford Carias Primary Care Provider: SYSTEM, PCP Provider/Extender: G Referring Provider: Myrtie Neither in Treatment: 0 Diagnosis Coding ICD-10 Codes Code Description S81.811D Laceration without foreign body, right lower leg, subsequent encounter I87.321 Chronic venous hypertension (idiopathic) with inflammation of right lower extremity Facility Procedures CPT4 Code Description:  [id number] - WOUND CARE VISIT-LEV 3 EST PT Modifier: Quantity: 1 CPT4 Code Description: [id number] 11043 - DEB MUSC/FASCIA 20 SQ CM/< ICD-10 Description Diagnosis S81.811D Laceration without foreign body, right lower leg, s Modifier: ubsequent enc Quantity: 1 ounter Physician Procedures CPT4: Description Modifier Quantity Code [id number] 99204 - WC PHYS LEVEL 4 - NEW PT 1 ICD-10 Description Diagnosis S81.811D Laceration without foreign body, right lower leg, subsequent encounter I87.321 Chronic venous hypertension (idiopathic) with  inflammation of right lower extremity CPT4: 4034742 59563 -  WC PHYS DEBR MUSCLE/FASCIA 20 SQ CM 1 ICD-10 Description Diagnosis S81.811D Laceration without foreign body, right lower leg, subsequent encounter Electronic Signature(s) Signed: 02/23/2017 5:12:21 PM By: Baltazar Najjar MD Entered By: Baltazar Najjar on 02/23/2017 14:47:12

## 2017-03-02 ENCOUNTER — Encounter: Payer: Medicare Other | Attending: Internal Medicine | Admitting: Internal Medicine

## 2017-03-02 DIAGNOSIS — I87321 Chronic venous hypertension (idiopathic) with inflammation of right lower extremity: Secondary | ICD-10-CM | POA: Insufficient documentation

## 2017-03-02 DIAGNOSIS — W19XXXD Unspecified fall, subsequent encounter: Secondary | ICD-10-CM | POA: Insufficient documentation

## 2017-03-02 DIAGNOSIS — S81811D Laceration without foreign body, right lower leg, subsequent encounter: Secondary | ICD-10-CM | POA: Diagnosis not present

## 2017-03-02 DIAGNOSIS — Z7982 Long term (current) use of aspirin: Secondary | ICD-10-CM | POA: Diagnosis not present

## 2017-03-03 NOTE — Progress Notes (Signed)
LAYTON, TAPPAN ([id number]) Visit Report for 03/02/2017 Arrival Information Details Patient Name: THAILY, HACKWORTH Date of Service: 03/02/2017 3:00 PM Medical Record Number: [id number] Patient Account Number: [id number] Date of Birth/Sex: 03/01/1929 (83 y.o. Female) Treating RN: Afful, RN, BSN, [employer] Primary Care Mardell Cragg: SYSTEM, PCP Other Clinician: Referring Rajean Desantiago: Altamese Cabal Treating Kylah Maresh/Extender: Altamese [place] in Treatment: 1 Visit Information History Since Last Visit All ordered tests and consults were completed: No Patient Arrived: [name] Added or deleted any medications: No Arrival Time: 15:02 Any new allergies or adverse reactions: No Accompanied By: dtr Had a fall or experienced change in No Transfer Assistance: None activities of daily living that may affect Patient Identification Verified: Yes risk of falls: Secondary Verification Process Completed: Yes Signs or symptoms of abuse/neglect since last No Patient Requires Transmission-Based No visito Precautions: Hospitalized since last visit: No Patient Has Alerts: No Has Dressing in Place as Prescribed: Yes Has Compression in Place as Prescribed: Yes Pain Present Now: No Electronic Signature(s) Signed: 03/02/2017 4:40:28 PM By: Elpidio Eric BSN, RN Entered By: Elpidio Eric on 03/02/2017 15:05:57 Krauser, Cheyanne ([id number]) -------------------------------------------------------------------------------- Clinic Level of Care Assessment Details Patient Name: [name] Date of Service: 03/02/2017 3:00 PM Medical Record Number: [id number] Patient Account Number: [id number] Date of Birth/Sex: Mar 09, 1929 (83 y.o. Female) Treating RN: Afful, RN, BSN, [employer] Primary Care Latitia Housewright: SYSTEM, PCP Other Clinician: Referring Deann Mclaine: Altamese Cabal Treating Wandy Bossler/Extender: Altamese [place] in Treatment: 1 Clinic Level of Care Assessment Items TOOL 4 Quantity Score []  - Use when only an EandM is performed on  FOLLOW-UP visit 0 ASSESSMENTS - Nursing Assessment / Reassessment X - Reassessment of Co-morbidities (includes updates in patient status) 1 10 X - Reassessment of Adherence to Treatment Plan 1 5 ASSESSMENTS - Wound and Skin Assessment / Reassessment X - Simple Wound Assessment / Reassessment - one wound 1 5 []  - Complex Wound Assessment / Reassessment - multiple wounds 0 []  - Dermatologic / Skin Assessment (not related to wound area) 0 ASSESSMENTS - Focused Assessment []  - Circumferential Edema Measurements - multi extremities 0 []  - Nutritional Assessment / Counseling / Intervention 0 X - Lower Extremity Assessment (monofilament, tuning fork, pulses) 1 5 []  - Peripheral Arterial Disease Assessment (using hand held doppler) 0 ASSESSMENTS - Ostomy and/or Continence Assessment and Care []  - Incontinence Assessment and Management 0 []  - Ostomy Care Assessment and Management (repouching, etc.) 0 PROCESS - Coordination of Care X - Simple Patient / Family Education for ongoing care 1 15 []  - Complex (extensive) Patient / Family Education for ongoing care 0 X - Staff obtains Chiropractor, Records, Test Results / Process Orders 1 10 []  - Staff telephones HHA, Nursing Homes / Clarify orders / etc 0 []  - Routine Transfer to another Facility (non-emergent condition) 0 Schlosser, Lilinoe ([id number]) []  - Routine Hospital Admission (non-emergent condition) 0 []  - New Admissions / Manufacturing engineer / Ordering NPWT, Apligraf, etc. 0 []  - Emergency Hospital Admission (emergent condition) 0 []  - Simple Discharge Coordination 0 []  - Complex (extensive) Discharge Coordination 0 PROCESS - Special Needs []  - Pediatric / Minor Patient Management 0 []  - Isolation Patient Management 0 []  - Hearing / Language / Visual special needs 0 []  - Assessment of Community assistance (transportation, D/C planning, etc.) 0 []  - Additional assistance / Altered mentation 0 []  - Support Surface(s) Assessment (bed, cushion,  seat, etc.) 0 INTERVENTIONS - Wound Cleansing / Measurement []  - Simple Wound Cleansing - one wound 0 X - Complex Wound Cleansing -  multiple wounds 1 5 X - Wound Imaging (photographs - any number of wounds) 1 5 []  - Wound Tracing (instead of photographs) 0 X - Simple Wound Measurement - one wound 1 5 []  - Complex Wound Measurement - multiple wounds 0 INTERVENTIONS - Wound Dressings []  - Small Wound Dressing one or multiple wounds 0 X - Medium Wound Dressing one or multiple wounds 1 15 []  - Large Wound Dressing one or multiple wounds 0 []  - Application of Medications - topical 0 []  - Application of Medications - injection 0 INTERVENTIONS - Miscellaneous []  - External ear exam 0 Lawhead, Layli ([id number]) []  - Specimen Collection (cultures, biopsies, blood, body fluids, etc.) 0 []  - Specimen(s) / Culture(s) sent or taken to Lab for analysis 0 []  - Patient Transfer (multiple staff / Michiel Sites Lift / Similar devices) 0 []  - Simple Staple / Suture removal (25 or less) 0 []  - Complex Staple / Suture removal (26 or more) 0 []  - Hypo / Hyperglycemic Management (close monitor of Blood Glucose) 0 []  - Ankle / Brachial Index (ABI) - do not check if billed separately 0 X - Vital Signs 1 5 Has the patient been seen at the hospital within the last three years: Yes Total Score: 85 Level Of Care: New/Established - Level 3 Electronic Signature(s) Signed: 03/02/2017 4:40:28 PM By: Elpidio Eric BSN, RN Entered By: Elpidio Eric on 03/02/2017 15:40:40 Richeson, Aubriegh ([id number]) -------------------------------------------------------------------------------- Encounter Discharge Information Details Patient Name: [name] Date of Service: 03/02/2017 3:00 PM Medical Record Number: [id number] Patient Account Number: [id number] Date of Birth/Sex: Aug 17, 1929 (83 y.o. Female) Treating RN: Afful, RN, BSN, [employer] Primary Care Isela Stantz: SYSTEM, PCP Other Clinician: Referring Cayenne Breault: Altamese Cabal Treating  Tabia Landowski/Extender: Altamese [place] in Treatment: 1 Encounter Discharge Information Items Schedule Follow-up Appointment: No Medication Reconciliation completed and No provided to Patient/Care Annai Heick: Clinical Summary of Care: Provided Form Type Recipient Paper Patient rb Electronic Signature(s) Signed: 03/02/2017 4:11:36 PM By: Dayton Martes RCP, RRT, CHT Entered By: Dayton Martes on 03/02/2017 16:11:36 Doyon, Sianni ([id number]) -------------------------------------------------------------------------------- Lower Extremity Assessment Details Patient Name: [name] Date of Service: 03/02/2017 3:00 PM Medical Record Number: [id number] Patient Account Number: [id number] Date of Birth/Sex: 1928/10/04 (83 y.o. Female) Treating RN: Afful, RN, BSN, [employer] Primary Care Daryle Boyington: SYSTEM, PCP Other Clinician: Referring Devarious Pavek: Altamese Cabal Treating Jataya Wann/Extender: Altamese [place] in Treatment: 1 Edema Assessment Assessed: [Left: No] [Right: No] Edema: [Left: N] [Right: o] Calf Left: Right: Point of Measurement: 28 cm From Medial Instep cm 35.4 cm Ankle Left: Right: Point of Measurement: 8 cm From Medial Instep cm 21.6 cm Vascular Assessment Claudication: Claudication Assessment [Right:None] Pulses: Dorsalis Pedis Palpable: [Right:Yes] Posterior Tibial Extremity colors, hair growth, and conditions: Extremity Color: [Right:Mottled] Temperature of Extremity: [Right:Warm] Capillary Refill: [Right:< 3 seconds] Toe Nail Assessment Left: Right: Thick: No Discolored: No Deformed: No Improper Length and Hygiene: No Electronic Signature(s) Signed: 03/02/2017 4:40:28 PM By: Elpidio Eric BSN, RN Entered By: Elpidio Eric on 03/02/2017 15:21:20 Koppel, Maryah ([id number]) Selinda Orion, Karley ([id number]) -------------------------------------------------------------------------------- Multi Wound Chart Details Patient Name: [name] Date of  Service: 03/02/2017 3:00 PM Medical Record Number: [id number] Patient Account Number: [id number] Date of Birth/Sex: 1929-09-01 (83 y.o. Female) Treating RN: Afful, RN, BSN, [employer] Primary Care Micheala Morissette: SYSTEM, PCP Other Clinician: Referring Gwenyth Dingee: Altamese Cabal Treating Sammantha Mehlhaff/Extender: Altamese  in Treatment: 1 Vital Signs Height(in): 64 Pulse(bpm): 79 Weight(lbs): 138 Blood Pressure 168/72 (mmHg): Body Mass Index(BMI): 24 Temperature(F): 97.6 Respiratory Rate 18 (breaths/min): Photos: [N/A:N/A]  Wound Location: Right Lower Leg - Anterior N/A N/A Wounding Event: Trauma N/A N/A Primary Etiology: Trauma, Other N/A N/A Comorbid History: Cataracts, Arrhythmia, N/A N/A Congestive Heart Failure, Hypertension, Osteoarthritis Date Acquired: 01/28/2017 N/A N/A Weeks of Treatment: 1 N/A N/A Wound Status: Open N/A N/A Measurements L x W x D 5x2.5x2 N/A N/A (cm) Area (cm) : 9.817 N/A N/A Volume (cm) : 19.635 N/A N/A % Reduction in Area: 0.00% N/A N/A % Reduction in Volume: 33.30% N/A N/A Starting Position 1 7 (o'clock): Ending Position 1 12 (o'clock): Maximum Distance 1 3.5 (cm): Undermining: Yes N/A N/A Hachey, Rhesa ([id number]) Classification: Full Thickness With N/A N/A Exposed Support Structures Exudate Amount: Large N/A N/A Exudate Type: Serosanguineous N/A N/A Exudate Color: red, brown N/A N/A Wound Margin: Flat and Intact N/A N/A Granulation Amount: Small (1-33%) N/A N/A Granulation Quality: Pale N/A N/A Necrotic Amount: Large (67-100%) N/A N/A Exposed Structures: Fat Layer (Subcutaneous N/A N/A Tissue) Exposed: Yes Muscle: Yes Fascia: No Tendon: No Joint: No Bone: No Epithelialization: None N/A N/A Periwound Skin Texture: Excoriation: No N/A N/A Induration: No Callus: No Crepitus: No Rash: No Scarring: No Periwound Skin Maceration: No N/A N/A Moisture: Dry/Scaly: No Periwound Skin Color: Erythema: Yes N/A N/A Mottled: Yes Atrophie  Blanche: No Cyanosis: No Ecchymosis: No Hemosiderin Staining: No Pallor: No Rubor: No Erythema Location: Circumferential N/A N/A Temperature: No Abnormality N/A N/A Tenderness on Yes N/A N/A Palpation: Wound Preparation: Ulcer Cleansing: N/A N/A Rinsed/Irrigated with Saline, Other: surg scrub and water Topical Anesthetic Applied: Other: lidocaine 4% Treatment Notes Makaria, Poarch Mylin ([id number]) Electronic Signature(s) Signed: 03/02/2017 5:27:39 PM By: Baltazar Najjar MD Entered By: Baltazar Najjar on 03/02/2017 16:39:13 Trauger, Mohogany ([id number]) -------------------------------------------------------------------------------- Multi-Disciplinary Care Plan Details Patient Name: [name] Date of Service: 03/02/2017 3:00 PM Medical Record Number: [id number] Patient Account Number: [id number] Date of Birth/Sex: 14-Jan-1929 (83 y.o. Female) Treating RN: Afful, RN, BSN, [employer] Primary Care Braylyn Eye: SYSTEM, PCP Other Clinician: Referring Lorcan Shelp: Altamese Cabal Treating Sheika Coutts/Extender: Altamese [place] in Treatment: 1 Active Inactive ` Abuse / Safety / Falls / Self Care Management Nursing Diagnoses: History of Falls Impaired home maintenance Impaired physical mobility Potential for falls Potential for injury related to transfers Goals: Patient will not develop complications from immobility Date Initiated: 02/23/2017 Target Resolution Date: 07/26/2017 Goal Status: Active Patient will not experience any injury related to falls Date Initiated: 02/23/2017 Target Resolution Date: 07/26/2017 Goal Status: Active Patient will remain injury free related to falls Date Initiated: 02/23/2017 Target Resolution Date: 07/26/2017 Goal Status: Active Patient/caregiver will demonstrate safe use of adaptive devices to increase mobility Date Initiated: 02/23/2017 Target Resolution Date: 07/26/2017 Goal Status: Active Patient/caregiver will identify factors that restrict self-care and  home management Date Initiated: 02/23/2017 Target Resolution Date: 07/26/2017 Goal Status: Active Patient/caregiver will verbalize understanding of skin care regimen Date Initiated: 02/23/2017 Target Resolution Date: 07/26/2017 Goal Status: Active Patient/caregiver will verbalize understanding of the importance to maintain current immunizations/vaccinations Date Initiated: 02/23/2017 Target Resolution Date: 07/26/2017 Goal Status: Active Patient/caregiver will verbalize/demonstrate measure taken to improve self care Date Initiated: 02/23/2017 Target Resolution Date: 07/26/2017 GWENNETH, WHITEMAN ([id number]) Goal Status: Active Patient/caregiver will verbalize/demonstrate measures taken to improve the patient's personal safety Date Initiated: 02/23/2017 Target Resolution Date: 07/26/2017 Goal Status: Active Patient/caregiver will verbalize/demonstrate measures taken to prevent injury and/or falls Date Initiated: 02/23/2017 Target Resolution Date: 07/26/2017 Goal Status: Active Patient/caregiver will verbalize/demonstrate understanding of what to do in case of emergency Date Initiated: 02/23/2017 Target Resolution Date: 07/26/2017 Goal Status: Active Interventions: Call  light and/or bell within patient's reach Assess Activities of Daily Living upon admission and as needed Assess fall risk on admission and as needed Assess: immobility, friction, shearing, incontinence upon admission and as needed Assess impairment of mobility on admission and as needed per policy Assess personal safety and home safety (as indicated) on admission and as needed Assess self care needs on admission and as needed Provide education on basic hygiene Provide education on fall prevention Provide education on personal and home safety Provide education on safe transfers Treatment Activities: Education provided on Basic Hygiene : 02/23/2017 Notes: ` Orientation to the Wound Care Program Nursing  Diagnoses: Knowledge deficit related to the wound healing center program Goals: Patient/caregiver will verbalize understanding of the Wound Healing Center Program Date Initiated: 02/23/2017 Target Resolution Date: 07/26/2017 Goal Status: Active Interventions: Provide education on orientation to the wound center Notes: ` SLEZAK, Latiana ([id number]) Wound/Skin Impairment Nursing Diagnoses: Impaired tissue integrity Knowledge deficit related to ulceration/compromised skin integrity Goals: Patient/caregiver will verbalize understanding of skin care regimen Date Initiated: 02/23/2017 Target Resolution Date: 07/26/2017 Goal Status: Active Ulcer/skin breakdown will have a volume reduction of 30% by week 4 Date Initiated: 02/23/2017 Target Resolution Date: 07/26/2017 Goal Status: Active Ulcer/skin breakdown will have a volume reduction of 50% by week 8 Date Initiated: 02/23/2017 Target Resolution Date: 07/26/2017 Goal Status: Active Ulcer/skin breakdown will have a volume reduction of 80% by week 12 Date Initiated: 02/23/2017 Target Resolution Date: 07/26/2017 Goal Status: Active Ulcer/skin breakdown will heal within 14 weeks Date Initiated: 02/23/2017 Target Resolution Date: 07/26/2017 Goal Status: Active Interventions: Assess patient/caregiver ability to obtain necessary supplies Assess patient/caregiver ability to perform ulcer/skin care regimen upon admission and as needed Assess ulceration(s) every visit Provide education on ulcer and skin care Treatment Activities: Referred to DME Annaliah Rivenbark for dressing supplies : 02/23/2017 Skin care regimen initiated : 02/23/2017 Topical wound management initiated : 02/23/2017 Notes: Electronic Signature(s) Signed: 03/02/2017 4:40:28 PM By: Elpidio Eric BSN, RN Entered By: Elpidio Eric on 03/02/2017 15:21:26 Wrubel, Brylin ([id number]) -------------------------------------------------------------------------------- Pain Assessment Details Patient  Name: [name] Date of Service: 03/02/2017 3:00 PM Medical Record Number: [id number] Patient Account Number: [id number] Date of Birth/Sex: Jul 20, 1929 (83 y.o. Female) Treating RN: Afful, RN, BSN, [employer] Primary Care Kashari Chalmers: SYSTEM, PCP Other Clinician: Referring Mairin Lindsley: Altamese Cabal Treating Kross Swallows/Extender: Altamese [place] in Treatment: 1 Active Problems Location of Pain Severity and Description of Pain Patient Has Paino No Site Locations With Dressing Change: No Pain Management and Medication Current Pain Management: Electronic Signature(s) Signed: 03/02/2017 4:40:28 PM By: Elpidio Eric BSN, RN Entered By: Elpidio Eric on 03/02/2017 15:06:04 Stfort, Agatha ([id number]) -------------------------------------------------------------------------------- Wound Assessment Details Patient Name: [name] Date of Service: 03/02/2017 3:00 PM Medical Record Number: [id number] Patient Account Number: [id number] Date of Birth/Sex: [birth year] (83 y.o. Female) Treating RN: Afful, RN, BSN, [employer] Primary Care Kenzie Flakes: SYSTEM, PCP Other Clinician: Referring Jermar Colter: Altamese Cabal Treating Orvan Papadakis/Extender: Altamese  in Treatment: 1 Wound Status Wound Number: 1 Primary Trauma, Other Etiology: Wound Location: Right Lower Leg - Anterior Wound Open Wounding Event: Trauma Status: Date Acquired: 01/28/2017 Comorbid Cataracts, Arrhythmia, Congestive Weeks Of Treatment: 1 History: Heart Failure, Hypertension, Clustered Wound: No Osteoarthritis Photos Photo Uploaded By: Elpidio Eric on 03/02/2017 16:38:44 Wound Measurements Length: (cm) 5 Width: (cm) 2.5 Depth: (cm) 2 Area: (cm) 9.817 Volume: (cm) 19.635 % Reduction in Area: 0% % Reduction in Volume: 33.3% Epithelialization: None Tunneling: No Undermining: Yes Starting Position (o'clock): 7 Ending Position (o'clock): 12 Maximum Distance: (cm) 3.5  Wound Description Full Thickness With  Exposed Classification: Support Structures Wound Margin: Flat and Intact Exudate Large Amount: Exudate Type: Serosanguineous Exudate Color: red, brown Nauman, Florence ([id number]) Foul Odor After Cleansing: No Slough/Fibrino Yes Wound Bed Granulation Amount: Small (1-33%) Exposed Structure Granulation Quality: Pale Fascia Exposed: No Necrotic Amount: Large (67-100%) Fat Layer (Subcutaneous Tissue) Exposed: Yes Necrotic Quality: Adherent Slough Tendon Exposed: No Muscle Exposed: Yes Necrosis of Muscle: Yes Joint Exposed: No Bone Exposed: No Periwound Skin Texture Texture Color No Abnormalities Noted: No No Abnormalities Noted: No Callus: No Atrophie Blanche: No Crepitus: No Cyanosis: No Excoriation: No Ecchymosis: No Induration: No Erythema: Yes Rash: No Erythema Location: Circumferential Scarring: No Hemosiderin Staining: No Mottled: Yes Moisture Pallor: No No Abnormalities Noted: No Rubor: No Dry / Scaly: No Maceration: No Temperature / Pain Temperature: No Abnormality Tenderness on Palpation: Yes Wound Preparation Ulcer Cleansing: Rinsed/Irrigated with Saline, Other: surg scrub and water, Topical Anesthetic Applied: Other: lidocaine 4%, Electronic Signature(s) Signed: 03/02/2017 4:40:28 PM By: Elpidio EricAfful, Yaneliz BSN, RN Entered By: Elpidio EricAfful, Ica on 03/02/2017 15:20:33 Girgis, Keryl ([id number]) -------------------------------------------------------------------------------- Vitals Details Patient Name: Elvia CollumBERMAN, Satara Date of Service: 03/02/2017 3:00 PM Medical Record Number: [id number] Patient Account Number: [id number] Date of Birth/Sex: 05/11/1929 (83 y.o. Female) Treating RN: Afful, RN, BSN, [employer] Primary Care Tiyanna Larcom: SYSTEM, PCP Other Clinician: Referring Laporsha Grealish: Altamese CabalJONES, MAURICE Treating Brooklynne Pereida/Extender: Altamese CarolinaOBSON, MICHAEL G Weeks in Treatment: 1 Vital Signs Time Taken: 15:06 Temperature (F): 97.6 Height (in): 64 Pulse (bpm): 79 Weight (lbs):  138 Respiratory Rate (breaths/min): 18 Body Mass Index (BMI): 23.7 Blood Pressure (mmHg): 168/72 Reference Range: 80 - 120 mg / dl Electronic Signature(s) Signed: 03/02/2017 4:40:28 PM By: Elpidio EricAfful, Daleyza BSN, RN Entered By: Elpidio EricAfful, Afra on 03/02/2017 15:06:46

## 2017-03-03 NOTE — Progress Notes (Signed)
[name], Araly ([id number]) Visit Report for 03/02/2017 Chief Complaint Document Details Patient Name: [name], [name] Date of Service: 03/02/2017 3:00 PM Medical Record Patient Account Number: [id number] Number: Treating RN: Clover MealyAfful, RN, BSN, [place] Sinkita 01/02/1929 (83 y.o. Other Clinician: Date of Birth/Sex: Female) Treating ROBSON, MICHAEL Primary Care Provider: SYSTEM, PCP Provider/Extender: G Referring Provider: Myrtie NeitherJONES, MAURICE Weeks in Treatment: 1 Information Obtained from: Patient Chief Complaint 02/23/17; patient is here for review of wound on the right anterior lower leg Electronic Signature(s) Signed: 03/02/2017 5:27:39 PM By: Baltazar Najjarobson, Michael MD Entered By: Baltazar Najjarobson, Michael on 03/02/2017 16:39:21 Winegar, Gaynel ([id number]) -------------------------------------------------------------------------------- HPI Details Patient Name: [name], [name] Date of Service: 03/02/2017 3:00 PM Medical Record Patient Account Number: [id number] Number: Treating RN: Clover MealyAfful, RN, BSN, [place] 10/25/1928 (83 y.o. Other Clinician: Date of Birth/Sex: Female) Treating ROBSON, MICHAEL Primary Care Provider: SYSTEM, PCP Provider/Extender: G Referring Provider: Myrtie NeitherJONES, MAURICE Weeks in Treatment: 1 History of Present Illness HPI Description: 02/23/17 this is an 83 year old woman who is at a fitness club about to sit down on a rowing machine. She fell and traumatized the anterior part of her right leg on a sharp metal surface. This caused immediate pain and bleeding. She stopped off on her way home at an urgent care ultimately was referred to the ER and by that time she had a significant hematoma of her right leg. She was followed in the urgent orthopedic clinic. She had a duplex ultrasound of the right leg on 5/10 that was negative for DVT x-ray of the right leg on 01/28/17 was negative including a CT scan of the tib-fib. The CT scan did show the subcutaneous hematoma which on 01/28/17 measured 7.7 cm craniocaudal  by 2.5 cm x 5.6 cm AP subcutaneous air was also identified felt likely secondary to small lacerations. There was no acute fracture. The patient has been continuing mostly with topical antibiotics on this wound. She did not have any compression. She is referred here from the urgent orthopedic clinic for review of this. She did not have a wound history she is not a diabetic. ABI in this clinic on the right was 1.1 on the left 1.0. She is on aspirin as her only anti-coagulant 03/02/17; the patient arrives for review of a traumatic wound on her right anterior leg with subsequent hematoma formation and considerable tissue loss on the right anterior lateral leg. We'll use silver alginate last week. We kept her under compression and the patient states that her pain was actually a lot better. Wound measures at 5 x 2.5 x 2. Consider home amount of undermining laterally by roughly 3.5 cm from 7 to 12:00 Electronic Signature(s) Signed: 03/02/2017 5:27:39 PM By: Baltazar Najjarobson, Michael MD Entered By: Baltazar Najjarobson, Michael on 03/02/2017 16:46:01 Apodaca, Ziomara ([id number]) -------------------------------------------------------------------------------- Physical Exam Details Patient Name: [name], [name] Date of Service: 03/02/2017 3:00 PM Medical Record Patient Account Number: [id number] Number: Treating RN: Clover MealyAfful, RN, BSN, [place] Sinkita 12/19/1928 (83 y.o. Other Clinician: Date of Birth/Sex: Female) Treating ROBSON, MICHAEL Primary Care Provider: SYSTEM, PCP Provider/Extender: G Referring Provider: Myrtie NeitherJONES, MAURICE Weeks in Treatment: 1 Constitutional Patient is hypertensive.. Pulse regular and within target range for patient.Marland Kitchen. Respirations regular, non-labored and within target range.. Temperature is normal and within the target range for the patient.Marland Kitchen. appears in no distress. Eyes Conjunctivae clear. No discharge. Respiratory Respiratory effort is easy and symmetric bilaterally. Rate is normal at rest and on room  air.. Cardiovascular Pedal pulses palpable and strong bilaterally.. Edema is well controlled. Lymphatic None palpable and popliteal or  inguinal area. Psychiatric No evidence of depression, anxiety, or agitation. Calm, cooperative, and communicative. Appropriate interactions and affect.. Notes Wound exam; the patient arrived today with a reasonably clean-looking wound bed. No debridement was required. There is no evidence of surrounding infection however there is considerable undermining laterally. Electronic Signature(s) Signed: 03/02/2017 5:27:39 PM By: Baltazar Najjar MD Entered By: Baltazar Najjar on 03/02/2017 16:46:56 Lotter, Wajiha ([id number]) -------------------------------------------------------------------------------- Physician Orders Details Patient Name: [name] Date of Service: 03/02/2017 3:00 PM Medical Record Patient Account Number: [id number] Number: Treating RN: Clover Mealy, RN, BSN, [place] S[place] 1928/11/21 (83 y.o. Other Clinician: Date of Birth/Sex: Female) Treating ROBSON, MICHAEL Primary Care Provider: SYSTEM, PCP Provider/Extender: G Referring Provider: Myrtie Neither in Treatment: 1 Verbal / Phone Orders: No Diagnosis Coding Wound Cleansing Wound #1 Right,Anterior Lower Leg o Cleanse wound with mild soap and water o May shower with protection. - MAY USE CAST PROTECTOR OR A BIG TRASH BAG OVER THE LEG AND SEAL IT OFF WITH A TAPE . DO NOT GET IT WET o No tub bath. Anesthetic Wound #1 Right,Anterior Lower Leg o Topical Lidocaine 4% cream applied to wound bed prior to debridement Primary Wound Dressing Wound #1 Right,Anterior Lower Leg o Santyl Ointment Secondary Dressing Wound #1 Right,Anterior Lower Leg o ABD pad o Dry Gauze Dressing Change Frequency Wound #1 Right,Anterior Lower Leg o Change dressing every week Follow-up Appointments Wound #1 Right,Anterior Lower Leg o Return Appointment in 1 week. Edema Control Wound #1  Right,Anterior Lower Leg o 3 Layer Compression System - Right Lower Extremity Poppe, Pura ([id number]) Additional Orders / Instructions Wound #1 Right,Anterior Lower Leg o Increase protein intake. o Activity as tolerated Negative Pressure Wound Therapy Wound #1 Right,Anterior Lower Leg o Other: - Order SNAP VAC Medications-please add to medication list. Wound #1 Right,Anterior Lower Leg o Santyl Enzymatic Ointment Electronic Signature(s) Signed: 03/02/2017 4:40:28 PM By: Elpidio Eric BSN, RN Signed: 03/02/2017 5:27:39 PM By: Baltazar Najjar MD Entered By: Elpidio Eric on 03/02/2017 15:39:34 Kolk, Edna ([id number]) -------------------------------------------------------------------------------- Problem List Details Patient Name: [name] Date of Service: 03/02/2017 3:00 PM Medical Record Patient Account Number: [id number] Number: Treating RN: Clover Mealy, RN, BSN, [place] 1929-09-25 (83 y.o. Other Clinician: Date of Birth/Sex: Female) Treating ROBSON, MICHAEL Primary Care Provider: SYSTEM, PCP Provider/Extender: G Referring Provider: Myrtie Neither in Treatment: 1 Active Problems ICD-10 Encounter Code Description Active Date Diagnosis S81.811D Laceration without foreign body, right lower leg, 02/23/2017 Yes subsequent encounter I87.321 Chronic venous hypertension (idiopathic) with 02/23/2017 Yes inflammation of right lower extremity Inactive Problems Resolved Problems Electronic Signature(s) Signed: 03/02/2017 5:27:39 PM By: Baltazar Najjar MD Entered By: Baltazar Najjar on 03/02/2017 16:39:08 Vonada, Megumi ([id number]) -------------------------------------------------------------------------------- Progress Note Details Patient Name: [name] Date of Service: 03/02/2017 3:00 PM Medical Record Patient Account Number: [id number] Number: Treating RN: Clover Mealy, RN, BSN, [place] June 20, 1929 (83 y.o. Other Clinician: Date of Birth/Sex: Female) Treating  ROBSON, MICHAEL Primary Care Provider: SYSTEM, PCP Provider/Extender: G Referring Provider: Myrtie Neither in Treatment: 1 Subjective Chief Complaint Information obtained from Patient 02/23/17; patient is here for review of wound on the right anterior lower leg History of Present Illness (HPI) 02/23/17 this is an 83 year old woman who is at a fitness club about to sit down on a rowing machine. She fell and traumatized the anterior part of her right leg on a sharp metal surface. This caused immediate pain and bleeding. She stopped off on her way home at an urgent care ultimately was referred to the ER and by that  time she had a significant hematoma of her right leg. She was followed in the urgent orthopedic clinic. She had a duplex ultrasound of the right leg on 5/10 that was negative for DVT x-ray of the right leg on 01/28/17 was negative including a CT scan of the tib-fib. The CT scan did show the subcutaneous hematoma which on 01/28/17 measured 7.7 cm craniocaudal by 2.5 cm x 5.6 cm AP subcutaneous air was also identified felt likely secondary to small lacerations. There was no acute fracture. The patient has been continuing mostly with topical antibiotics on this wound. She did not have any compression. She is referred here from the urgent orthopedic clinic for review of this. She did not have a wound history she is not a diabetic. ABI in this clinic on the right was 1.1 on the left 1.0. She is on aspirin as her only anti-coagulant 03/02/17; the patient arrives for review of a traumatic wound on her right anterior leg with subsequent hematoma formation and considerable tissue loss on the right anterior lateral leg. We'll use silver alginate last week. We kept her under compression and the patient states that her pain was actually a lot better. Wound measures at 5 x 2.5 x 2. Consider home amount of undermining laterally by roughly 3.5 cm from 7 to 12:00 Objective Constitutional Patient is  hypertensive.. Pulse regular and within target range for patient.Marland Kitchen Respirations regular, non-labored and within target range.. Temperature is normal and within the target range for the patient.Marland Kitchen appears in no distress. Vitals Time Taken: 3:06 PM, Height: 64 in, Weight: 138 lbs, BMI: 23.7, Temperature: 97.6 F, Pulse: 79 bpm, Respiratory Rate: 18 breaths/min, Blood Pressure: 168/72 mmHg. [facility], [place] ([id number]) Eyes Conjunctivae clear. No discharge. Respiratory Respiratory effort is easy and symmetric bilaterally. Rate is normal at rest and on room air.. Cardiovascular Pedal pulses palpable and strong bilaterally.. Edema is well controlled. Lymphatic None palpable and popliteal or inguinal area. Psychiatric No evidence of depression, anxiety, or agitation. Calm, cooperative, and communicative. Appropriate interactions and affect.. General Notes: Wound exam; the patient arrived today with a reasonably clean-looking wound bed. No debridement was required. There is no evidence of surrounding infection however there is considerable undermining laterally. Integumentary (Hair, Skin) Wound #1 status is Open. Original cause of wound was Trauma. The wound is located on the Right,Anterior Lower Leg. The wound measures 5cm length x 2.5cm width x 2cm depth; 9.817cm^2 area and 19.635cm^3 volume. There is muscle and Fat Layer (Subcutaneous Tissue) Exposed exposed. There is no tunneling noted, however, there is undermining starting at 7:00 and ending at 12:00 with a maximum distance of 3.5cm. There is a large amount of serosanguineous drainage noted. The wound margin is flat and intact. There is small (1-33%) pale granulation within the wound bed. There is a large (67-100%) amount of necrotic tissue within the wound bed including Adherent Slough and Necrosis of Muscle. The periwound skin appearance exhibited: Mottled, Erythema. The periwound skin appearance did not exhibit: Callus, Crepitus,  Excoriation, Induration, Rash, Scarring, Dry/Scaly, Maceration, Atrophie Blanche, Cyanosis, Ecchymosis, Hemosiderin Staining, Pallor, Rubor. The surrounding wound skin color is noted with erythema which is circumferential. Periwound temperature was noted as No Abnormality. The periwound has tenderness on palpation. Assessment Active Problems ICD-10 S81.811D - Laceration without foreign body, right lower leg, subsequent encounter I87.321 - Chronic venous hypertension (idiopathic) with inflammation of right lower extremity Gil, Lativia ([id number]) Plan Wound Cleansing: Wound #1 Right,Anterior Lower Leg: Cleanse wound with mild soap and water May shower  with protection. - MAY USE CAST PROTECTOR OR A BIG TRASH BAG OVER THE LEG AND SEAL IT OFF WITH A TAPE . DO NOT GET IT WET No tub bath. Anesthetic: Wound #1 Right,Anterior Lower Leg: Topical Lidocaine 4% cream applied to wound bed prior to debridement Primary Wound Dressing: Wound #1 Right,Anterior Lower Leg: Santyl Ointment Secondary Dressing: Wound #1 Right,Anterior Lower Leg: ABD pad Dry Gauze Dressing Change Frequency: Wound #1 Right,Anterior Lower Leg: Change dressing every week Follow-up Appointments: Wound #1 Right,Anterior Lower Leg: Return Appointment in 1 week. Edema Control: Wound #1 Right,Anterior Lower Leg: 3 Layer Compression System - Right Lower Extremity Additional Orders / Instructions: Wound #1 Right,Anterior Lower Leg: Increase protein intake. Activity as tolerated Negative Pressure Wound Therapy: Wound #1 Right,Anterior Lower Leg: Other: - Order SNAP VAC Medications-please add to medication list.: Wound #1 Right,Anterior Lower Leg: Santyl Enzymatic Ointment #1 initially a traumatic wound with an underlying hematoma. No debridement and this was necessary today. I see no evidence of infection. The wound has depth and considerable undermining. I think this would be a [employer], Kabrea ([id number]) good wound for  a wound VAC. Considering its size and location one of the new [employer] Snap Vacs would seem appropriate for the patient's ease and comfort. We'll see if we can arrange this through her insurance. #2 we use silver collagen moist gauze today. It is the undermining that makes me concerned and really makes the [facility] the treatment of choice here. Electronic Signature(s) Signed: 03/02/2017 5:27:39 PM By: Baltazar Najjar MD Entered By: Baltazar Najjar on 03/02/2017 16:48:33 Farace, Sheniqua ([id number]) -------------------------------------------------------------------------------- SuperBill Details Patient Name: [name] Date of Service: 03/02/2017 Medical Record Patient Account Number: [id number] Number: Treating RN: Clover Mealy, RN, BSN, [place] 02/02/1929 (83 y.o. Other Clinician: Date of Birth/Sex: Female) Treating ROBSON, MICHAEL Primary Care Provider: SYSTEM, PCP Provider/Extender: G Referring Provider: Myrtie Neither in Treatment: 1 Diagnosis Coding ICD-10 Codes Code Description S81.811D Laceration without foreign body, right lower leg, subsequent encounter I87.321 Chronic venous hypertension (idiopathic) with inflammation of right lower extremity Facility Procedures CPT4: Description Modifier Quantity Code [id number] 99213 - WOUND CARE VISIT-LEV 3 EST PT 1 CPT4: 30865784 (Facility Use Only) 69629BM - APPLY MULTLAY COMPRS LWR RT 1 LEG Physician Procedures CPT4 Code Description: [id number] 99213 - WC PHYS LEVEL 3 - EST PT ICD-10 Description Diagnosis S81.811D Laceration without foreign body, right lower leg, Modifier: subsequent enc Quantity: 1 ounter Electronic Signature(s) Signed: 03/02/2017 5:27:39 PM By: Baltazar Najjar MD Previous Signature: 03/02/2017 4:40:28 PM Version By: Elpidio Eric BSN, RN Entered By: Baltazar Najjar on 03/02/2017 16:48:49

## 2017-03-09 ENCOUNTER — Encounter: Payer: Medicare Other | Admitting: Internal Medicine

## 2017-03-09 DIAGNOSIS — I87321 Chronic venous hypertension (idiopathic) with inflammation of right lower extremity: Secondary | ICD-10-CM | POA: Diagnosis not present

## 2017-03-10 DIAGNOSIS — I87321 Chronic venous hypertension (idiopathic) with inflammation of right lower extremity: Secondary | ICD-10-CM | POA: Diagnosis not present

## 2017-03-10 NOTE — Progress Notes (Signed)
[name], [name] ([id number]) Visit Report for 03/09/2017 Arrival Information Details Patient Name: [name], [name] Date of Service: 03/09/2017 2:00 PM Medical Record Number: [id number] Patient Account Number: [id number] Date of Birth/Sex: [birth year] (83 y.o. Female) Treating [name]: Afful, [name], [name], [employer] Primary Care Davette Nugent: SYSTEM, PCP Other Clinician: Referring Amil Moseman: Altamese Cabal Treating Milicent Acheampong/Extender: Altamese [place] in Treatment: 2 Visit Information History Since Last Visit All ordered tests and consults were completed: No Patient Arrived: [name] Added or deleted any medications: No Arrival Time: 14:17 Any new allergies or adverse reactions: No Accompanied By: dtr Had a fall or experienced change in No Transfer Assistance: None activities of daily living that may affect Patient Identification Verified: Yes risk of falls: Secondary Verification Process Completed: Yes Signs or symptoms of abuse/neglect since last No Patient Requires Transmission-Based No visito Precautions: Hospitalized since last visit: No Patient Has Alerts: No Has Dressing in Place as Prescribed: Yes Has Compression in Place as Prescribed: Yes Pain Present Now: Yes Electronic Signature(s) Signed: 03/09/2017 4:53:20 PM By: Elpidio Eric BSN, [name] Entered By: Elpidio Eric on 03/09/2017 14:21:20 [name], [name] ([id number]) -------------------------------------------------------------------------------- Encounter Discharge Information Details Patient Name: [name] Date of Service: 03/09/2017 2:00 PM Medical Record Number: [id number] Patient Account Number: [id number] Date of Birth/Sex: [birth year] (83 y.o. Female) Treating [name]: Afful, [name], [name], [employer] Primary Care Aleksei Goodlin: SYSTEM, PCP Other Clinician: Referring Adela Esteban: Altamese Cabal Treating Erandy Mceachern/Extender: Altamese [place] in Treatment: 2 Encounter Discharge Information Items Discharge Pain Level: 0 Discharge Condition: Stable Ambulatory  Status: Cane Discharge Destination: Home Private Transportation: Auto Accompanied By: Melony Overly Schedule Follow-up Appointment: No Medication Reconciliation completed and No provided to Patient/Care Phillip Sandler: Clinical Summary of Care: Electronic Signature(s) Signed: 03/09/2017 4:53:20 PM By: Elpidio Eric BSN, [name] Entered By: Elpidio Eric on 03/09/2017 15:46:37 [name], [name] ([id number]) -------------------------------------------------------------------------------- Lower Extremity Assessment Details Patient Name: [name] Date of Service: 03/09/2017 2:00 PM Medical Record Number: [id number] Patient Account Number: [id number] Date of Birth/Sex: [birth year] (83 y.o. Female) Treating [name]: Afful, [name], [name], [employer] Primary Care Bernadette Armijo: SYSTEM, PCP Other Clinician: Referring Demitri Kucinski: Altamese Cabal Treating Kianna Billet/Extender: Altamese [place] in Treatment: 2 Edema Assessment Assessed: [Left: No] [Right: No] E[Left: dema] [Right: :] Calf Left: Right: Point of Measurement: 28 cm From Medial Instep cm 35.1 cm Ankle Left: Right: Point of Measurement: 8 cm From Medial Instep cm 21 cm Vascular Assessment Claudication: Claudication Assessment [Right:None] Pulses: Dorsalis Pedis Palpable: [Right:Yes] Posterior Tibial Extremity colors, hair growth, and conditions: Extremity Color: [Right:Mottled] Hair Growth on Extremity: [Right:No] Temperature of Extremity: [Right:Warm] Capillary Refill: [Right:< 3 seconds] Toe Nail Assessment Left: Right: Thick: No Discolored: No Deformed: No Improper Length and Hygiene: No Electronic Signature(s) Signed: 03/09/2017 4:53:20 PM By: Elpidio Eric BSN, [name] Entered By: Elpidio Eric on 03/09/2017 14:22:16 [name], [name] ([id number]) [name], [name] ([id number]) -------------------------------------------------------------------------------- Multi Wound Chart Details Patient Name: [name] Date of Service: 03/09/2017 2:00 PM Medical Record Number:  [id number] Patient Account Number: [id number] Date of Birth/Sex: Mar 18, 1929 (83 y.o. Female) Treating [name]: Afful, [name], [name], [employer] Primary Care Kiarrah Rausch: SYSTEM, PCP Other Clinician: Referring Allahna Husband: Altamese Cabal Treating Kaydyn Sayas/Extender: Altamese [place] in Treatment: 2 Vital Signs Height(in): 64 Pulse(bpm): 75 Weight(lbs): 138 Blood Pressure 176/64 (mmHg): Body Mass Index(BMI): 24 Temperature(F): 97.6 Respiratory Rate 18 (breaths/min): Photos: [1:No Photos] [N/A:N/A] Wound Location: [1:Right Lower Leg - Anterior N/A] Wounding Event: [1:Trauma] [N/A:N/A] Primary Etiology: [1:Trauma, Other] [N/A:N/A] Comorbid History: [1:Cataracts, Arrhythmia, Congestive Heart Failure, Hypertension, Osteoarthritis] [N/A:N/A] Date Acquired: [1:01/28/2017] [N/A:N/A] Weeks of Treatment: [1:2] [N/A:N/A] Wound Status: [1:Open] [  N/A:N/A] Measurements L x W x D 4.4x2.2x1.5 [N/A:N/A] (cm) Area (cm) : [1:7.603] [N/A:N/A] Volume (cm) : [1:11.404] [N/A:N/A] % Reduction in Area: [1:22.60%] [N/A:N/A] % Reduction in Volume: 61.30% [N/A:N/A] Classification: [1:Full Thickness With Exposed Support Structures] [N/A:N/A] Exudate Amount: [1:Large] [N/A:N/A] Exudate Type: [1:Serosanguineous] [N/A:N/A] Exudate Color: [1:red, brown] [N/A:N/A] Wound Margin: [1:Flat and Intact] [N/A:N/A] Granulation Amount: [1:Small (1-33%)] [N/A:N/A] Granulation Quality: [1:Pale] [N/A:N/A] Necrotic Amount: [1:Large (67-100%)] [N/A:N/A] Exposed Structures: [1:Fat Layer (Subcutaneous N/A Tissue) Exposed: Yes Muscle: Yes] Fascia: No Tendon: No Joint: No Bone: No Epithelialization: None N/A N/A Debridement: Debridement (40981- N/A N/A 11047) Pre-procedure 14:59 N/A N/A Verification/Time Out Taken: Pain Control: Lidocaine 4% Topical N/A N/A Solution Tissue Debrided: Fibrin/Slough, Fat, N/A N/A Exudates, Subcutaneous Level: Skin/Subcutaneous N/A N/A Tissue Debridement Area (sq 9.68 N/A N/A cm): Instrument:  Curette N/A N/A Bleeding: Minimum N/A N/A Hemostasis Achieved: Pressure N/A N/A Procedural Pain: 0 N/A N/A Post Procedural Pain: 0 N/A N/A Debridement Treatment Procedure was tolerated N/A N/A Response: well Post Debridement 4.4x2.2x1.5 N/A N/A Measurements L x W x D (cm) Post Debridement 11.404 N/A N/A Volume: (cm) Periwound Skin Texture: Excoriation: No N/A N/A Induration: No Callus: No Crepitus: No Rash: No Scarring: No Periwound Skin Maceration: No N/A N/A Moisture: Dry/Scaly: No Periwound Skin Color: Erythema: Yes N/A N/A Mottled: Yes Atrophie Blanche: No Cyanosis: No Ecchymosis: No Hemosiderin Staining: No Pallor: No Rubor: No Erythema Location: Circumferential N/A N/A Temperature: No Abnormality N/A N/A Tenderness on Yes N/A N/A Palpation: Nan, Tommy ([id number]) Wound Preparation: Ulcer Cleansing: N/A N/A Rinsed/Irrigated with Saline, Other: surg scrub and water Topical Anesthetic Applied: Other: lidocaine 4% Procedures Performed: Debridement N/A N/A Treatment Notes Wound #1 (Right, Anterior Lower Leg) 1. Cleansed with: Clean wound with Normal Saline 3. Peri-wound Care: Skin Prep 4. Dressing Applied: Prisma Ag 8. Negative Pressure Wound Therapy Wound Vac to wound continuously at 132mm/hg pressure Other (specify in notes) Notes SNAP VAC applied Electronic Signature(s) Signed: 03/09/2017 6:01:27 PM By: Baltazar Najjar MD Entered By: Baltazar Najjar on 03/09/2017 16:39:40 [name], [name] ([id number]) -------------------------------------------------------------------------------- Multi-Disciplinary Care Plan Details Patient Name: [name] Date of Service: 03/09/2017 2:00 PM Medical Record Number: [id number] Patient Account Number: [id number] Date of Birth/Sex: 01-10-1929 (83 y.o. Female) Treating [name]: Afful, [name], [name], [employer] Primary Care Burns Timson: SYSTEM, PCP Other Clinician: Referring Brayden Betters: Altamese Cabal Treating Kem Parcher/Extender: Altamese [place] in Treatment: 2 Active Inactive ` Abuse / Safety / Falls / Self Care Management Nursing Diagnoses: History of Falls Impaired home maintenance Impaired physical mobility Potential for falls Potential for injury related to transfers Goals: Patient will not develop complications from immobility Date Initiated: 02/23/2017 Target Resolution Date: 07/26/2017 Goal Status: Active Patient will not experience any injury related to falls Date Initiated: 02/23/2017 Target Resolution Date: 07/26/2017 Goal Status: Active Patient will remain injury free related to falls Date Initiated: 02/23/2017 Target Resolution Date: 07/26/2017 Goal Status: Active Patient/caregiver will demonstrate safe use of adaptive devices to increase mobility Date Initiated: 02/23/2017 Target Resolution Date: 07/26/2017 Goal Status: Active Patient/caregiver will identify factors that restrict self-care and home management Date Initiated: 02/23/2017 Target Resolution Date: 07/26/2017 Goal Status: Active Patient/caregiver will verbalize understanding of skin care regimen Date Initiated: 02/23/2017 Target Resolution Date: 07/26/2017 Goal Status: Active Patient/caregiver will verbalize understanding of the importance to maintain current immunizations/vaccinations Date Initiated: 02/23/2017 Target Resolution Date: 07/26/2017 Goal Status: Active Patient/caregiver will verbalize/demonstrate measure taken to improve self care Date Initiated: 02/23/2017 Target Resolution Date: 07/26/2017 KEASHIA, HASKINS ([id number]) Goal Status: Active Patient/caregiver will verbalize/demonstrate measures  taken to improve the patient's personal safety Date Initiated: 02/23/2017 Target Resolution Date: 07/26/2017 Goal Status: Active Patient/caregiver will verbalize/demonstrate measures taken to prevent injury and/or falls Date Initiated: 02/23/2017 Target Resolution Date: 07/26/2017 Goal Status: Active Patient/caregiver will  verbalize/demonstrate understanding of what to do in case of emergency Date Initiated: 02/23/2017 Target Resolution Date: 07/26/2017 Goal Status: Active Interventions: Call light and/or bell within patient's reach Assess Activities of Daily Living upon admission and as needed Assess fall risk on admission and as needed Assess: immobility, friction, shearing, incontinence upon admission and as needed Assess impairment of mobility on admission and as needed per policy Assess personal safety and home safety (as indicated) on admission and as needed Assess self care needs on admission and as needed Provide education on basic hygiene Provide education on fall prevention Provide education on personal and home safety Provide education on safe transfers Treatment Activities: Education provided on Basic Hygiene : 02/23/2017 Notes: ` Orientation to the Wound Care Program Nursing Diagnoses: Knowledge deficit related to the wound healing center program Goals: Patient/caregiver will verbalize understanding of the Wound Healing Center Program Date Initiated: 02/23/2017 Target Resolution Date: 07/26/2017 Goal Status: Active Interventions: Provide education on orientation to the wound center Notes: Chanay Nugent, Tongela ([id number]) Wound/Skin Impairment Nursing Diagnoses: Impaired tissue integrity Knowledge deficit related to ulceration/compromised skin integrity Goals: Patient/caregiver will verbalize understanding of skin care regimen Date Initiated: 02/23/2017 Target Resolution Date: 07/26/2017 Goal Status: Active Ulcer/skin breakdown will have a volume reduction of 30% by week 4 Date Initiated: 02/23/2017 Target Resolution Date: 07/26/2017 Goal Status: Active Ulcer/skin breakdown will have a volume reduction of 50% by week 8 Date Initiated: 02/23/2017 Target Resolution Date: 07/26/2017 Goal Status: Active Ulcer/skin breakdown will have a volume reduction of 80% by week 12 Date Initiated:  02/23/2017 Target Resolution Date: 07/26/2017 Goal Status: Active Ulcer/skin breakdown will heal within 14 weeks Date Initiated: 02/23/2017 Target Resolution Date: 07/26/2017 Goal Status: Active Interventions: Assess patient/caregiver ability to obtain necessary supplies Assess patient/caregiver ability to perform ulcer/skin care regimen upon admission and as needed Assess ulceration(s) every visit Provide education on ulcer and skin care Treatment Activities: Referred to DME Marques Ericson for dressing supplies : 02/23/2017 Skin care regimen initiated : 02/23/2017 Topical wound management initiated : 02/23/2017 Notes: Electronic Signature(s) Signed: 03/09/2017 4:53:20 PM By: Elpidio Eric BSN, [name] Entered By: Elpidio Eric on 03/09/2017 14:36:36 [name], [name] ([id number]) -------------------------------------------------------------------------------- Pain Assessment Details Patient Name: [name] Date of Service: 03/09/2017 2:00 PM Medical Record Number: [id number] Patient Account Number: [id number] Date of Birth/Sex: 1929-05-07 (83 y.o. Female) Treating [name]: Afful, [name], [name], [employer] Primary Care [place]: SYSTEM, PCP Other Clinician: Referring Marieme Mcmackin: Altamese Cabal Treating Jurrell Royster/Extender: Altamese [place] in Treatment: 2 Active Problems Location of Pain Severity and Description of Pain Patient Has Paino Yes Site Locations Pain Location: Pain in Ulcers With Dressing Change: Yes Rate the pain. Current Pain Level: 3 Character of Pain Describe the Pain: Tender Pain Management and Medication Current Pain Management: Electronic Signature(s) Signed: 03/09/2017 4:53:20 PM By: Elpidio Eric BSN, [name] Entered By: Elpidio Eric on 03/09/2017 14:21:46 [name], [name] ([id number]) -------------------------------------------------------------------------------- Patient/Caregiver Education Details Patient Name: [name] Date of Service: 03/09/2017 2:00 PM Medical Record Patient Account  Number: [id number] Number: Treating [name]: [name] Mealy, [name], [name], [name] (83 y.o. Other Clinician: Date of Birth/Gender: Female) Treating ROBSON, MICHAEL Primary Care Physician: SYSTEM, PCP Physician/Extender: G Referring Physician: Myrtie Neither in Treatment: 2 Education Assessment Education Provided To: Patient Education Topics Provided Basic Hygiene: Methods: Explain/Verbal Responses:  State content correctly Safety: Methods: Explain/Verbal Responses: State content correctly Welcome To The Wound Care Center: Methods: Explain/Verbal Responses: State content correctly Wound/Skin Impairment: Methods: Explain/Verbal Responses: State content correctly Electronic Signature(s) Signed: 03/09/2017 4:53:20 PM By: Elpidio EricAfful, Delisia BSN, [name] Entered By: Elpidio EricAfful, Klair on 03/09/2017 15:46:55 [name], [name] ([id number]) -------------------------------------------------------------------------------- Wound Assessment Details Patient Name: [name] CollumBERMAN, Omni Date of Service: 03/09/2017 2:00 PM Medical Record Number: [id number] Patient Account Number: [id number] Date of Birth/Sex: 09/06/1929 (83 y.o. Female) Treating [name]: Afful, [name], [name], [employer] Primary Care Contessa Preuss: SYSTEM, PCP Other Clinician: Referring Mattalyn Anderegg: Altamese CabalJONES, MAURICE Treating Aubriana Ravelo/Extender: Altamese CarolinaOBSON, MICHAEL G Weeks in Treatment: 2 Wound Status Wound Number: 1 Primary Trauma, Other Etiology: Wound Location: Right Lower Leg - Anterior Wound Open Wounding Event: Trauma Status: Date Acquired: 01/28/2017 Comorbid Cataracts, Arrhythmia, Congestive Weeks Of Treatment: 2 History: Heart Failure, Hypertension, Clustered Wound: No Osteoarthritis Photos Photo Uploaded By: Elpidio EricAfful, Dalanie on 03/09/2017 17:00:01 Wound Measurements Length: (cm) 4.4 Width: (cm) 2.2 Depth: (cm) 1.5 Area: (cm) 7.603 Volume: (cm) 11.404 % Reduction in Area: 22.6% % Reduction in Volume: 61.3% Epithelialization: None Tunneling: No Undermining:  No Wound Description Full Thickness With Exposed Classification: Support Structures Wound Margin: Flat and Intact Exudate Large Amount: Exudate Type: Serosanguineous Exudate Color: red, brown Foul Odor After Cleansing: No Slough/Fibrino Yes Wound Bed Granulation Amount: Small (1-33%) Exposed Structure Granulation Quality: Pale Fascia Exposed: No Fojtik, Kala ([id number]) Necrotic Amount: Large (67-100%) Fat Layer (Subcutaneous Tissue) Exposed: Yes Necrotic Quality: Adherent Slough Tendon Exposed: No Muscle Exposed: Yes Necrosis of Muscle: Yes Joint Exposed: No Bone Exposed: No Periwound Skin Texture Texture Color No Abnormalities Noted: No No Abnormalities Noted: No Callus: No Atrophie Blanche: No Crepitus: No Cyanosis: No Excoriation: No Ecchymosis: No Induration: No Erythema: Yes Rash: No Erythema Location: Circumferential Scarring: No Hemosiderin Staining: No Mottled: Yes Moisture Pallor: No No Abnormalities Noted: No Rubor: No Dry / Scaly: No Maceration: No Temperature / Pain Temperature: No Abnormality Tenderness on Palpation: Yes Wound Preparation Ulcer Cleansing: Rinsed/Irrigated with Saline, Other: surg scrub and water, Topical Anesthetic Applied: Other: lidocaine 4%, Electronic Signature(s) Signed: 03/09/2017 4:53:20 PM By: Elpidio EricAfful, Nancylee BSN, [name] Entered By: Elpidio EricAfful, Corey on 03/09/2017 14:36:24 YQMVHQBERMAN, Jacklyn ([id number]) -------------------------------------------------------------------------------- Vitals Details Patient Name: [name] CollumBERMAN, Skylynne Date of Service: 03/09/2017 2:00 PM Medical Record Number: [id number] Patient Account Number: [id number] Date of Birth/Sex: 10/15/1928 (83 y.o. Female) Treating [name]: Afful, [name], [name], [employer] Primary Care Dare Spillman: SYSTEM, PCP Other Clinician: Referring Brena [name]: Altamese CabalJONES, MAURICE Treating Aleeza [place]/Extender: Altamese CarolinaOBSON, MICHAEL G Weeks in Treatment: 2 Vital Signs Time Taken: 14:23 Temperature (F): 97.6 Height (in):  64 Pulse (bpm): 75 Weight (lbs): 138 Respiratory Rate (breaths/min): 18 Body Mass Index (BMI): 23.7 Blood Pressure (mmHg): 176/64 Reference Range: 80 - 120 mg / dl Electronic Signature(s) Signed: 03/09/2017 4:53:20 PM By: Elpidio EricAfful, Sahvanna BSN, [name] Entered By: Elpidio EricAfful, Brandace on 03/09/2017 14:23:41

## 2017-03-10 NOTE — Progress Notes (Addendum)
[name] ([id number]) Visit Report for 03/09/2017 Chief Complaint Document Details Patient Name: [name], [name] Date of Service: 03/09/2017 2:00 PM Medical Record Patient Account Number: [id number] Number: Treating RN: Clover Mealy, RN, BSN, [place] Sink [birth year] (83 y.o. Other Clinician: Date of Birth/Sex: Female) Treating Iliana Hutt Primary Care Provider: SYSTEM, PCP Provider/Extender: G Referring Provider: Myrtie Neither in Treatment: 2 Information Obtained from: Patient Chief Complaint 02/23/17; patient is here for review of wound on the right anterior lower leg Electronic Signature(s) Signed: 03/09/2017 6:01:27 PM By: Baltazar Najjar MD Entered By: Baltazar Najjar on 03/09/2017 16:39:55 Prude, Myah ([id number]) -------------------------------------------------------------------------------- Debridement Details Patient Name: [name] Date of Service: 03/09/2017 2:00 PM Medical Record Patient Account Number: [id number] Number: Treating RN: Clover Mealy, RN, BSN, [place] [birth year] (83 y.o. Other Clinician: Date of Birth/Sex: Female) Treating Tedford Berg Primary Care Provider: SYSTEM, PCP Provider/Extender: G Referring Provider: Myrtie Neither in Treatment: 2 Debridement Performed for Wound #1 Right,Anterior Lower Leg Assessment: Performed By: Physician Maxwell Caul, MD Debridement: Debridement Pre-procedure Verification/Time Out Yes - 14:59 Taken: Start Time: 14:59 Pain Control: Lidocaine 4% Topical Solution Level: Skin/Subcutaneous Tissue Total Area Debrided (L x 4.4 (cm) x 2.2 (cm) = 9.68 (cm) W): Tissue and other Non-Viable, Exudate, Fat, Fibrin/Slough, Subcutaneous material debrided: Instrument: Curette Bleeding: Minimum Hemostasis Achieved: Pressure End Time: 15:03 Procedural Pain: 0 Post Procedural Pain: 0 Response to Treatment: Procedure was tolerated well Post Debridement Measurements of Total Wound Length: (cm) 4.4 Width:  (cm) 2.2 Depth: (cm) 1.5 Volume: (cm) 11.404 Character of Wound/Ulcer Post Requires Further Debridement Debridement: Post Procedure Diagnosis Same as Pre-procedure Electronic Signature(s) Signed: 03/09/2017 4:53:20 PM By: Elpidio Eric BSN, RN Signed: 03/09/2017 6:01:27 PM By: Baltazar Najjar MD Entered By: Baltazar Najjar on 03/09/2017 16:39:49 Neyman, Laticha ([id number]) Selinda Orion, Lamya ([id number]) -------------------------------------------------------------------------------- HPI Details Patient Name: [name] Date of Service: 03/09/2017 2:00 PM Medical Record Patient Account Number: [id number] Number: Treating RN: Clover Mealy, RN, BSN, [place] August 06, 1929 (83 y.o. Other Clinician: Date of Birth/Sex: Female) Treating Beila Purdie Primary Care Provider: SYSTEM, PCP Provider/Extender: G Referring Provider: Myrtie Neither in Treatment: 2 History of Present Illness HPI Description: 02/23/17 this is an 83 year old woman who is at a fitness club about to sit down on a rowing machine. She fell and traumatized the anterior part of her right leg on a sharp metal surface. This caused immediate pain and bleeding. She stopped off on her way home at an urgent care ultimately was referred to the ER and by that time she had a significant hematoma of her right leg. She was followed in the urgent orthopedic clinic. She had a duplex ultrasound of the right leg on 5/10 that was negative for DVT x-ray of the right leg on 01/28/17 was negative including a CT scan of the tib-fib. The CT scan did show the subcutaneous hematoma which on 01/28/17 measured 7.7 cm craniocaudal by 2.5 cm x 5.6 cm AP subcutaneous air was also identified felt likely secondary to small lacerations. There was no acute fracture. The patient has been continuing mostly with topical antibiotics on this wound. She did not have any compression. She is referred here from the urgent orthopedic clinic for review of this. She did not  have a wound history she is not a diabetic. ABI in this clinic on the right was 1.1 on the left 1.0. She is on aspirin as her only anti-coagulant 03/02/17; the patient arrives for review of a traumatic wound on her right anterior leg with subsequent hematoma formation  and considerable tissue loss on the right anterior lateral leg. We'll use silver alginate last week. We kept her under compression and the patient states that her pain was actually a lot better. Wound measures at 5 x 2.5 x 2. Consider home amount of undermining laterally by roughly 3.5 cm from 7 to 12:00 03/09/17; wound actually looks better however there is considerable undermining from roughly 7 to 12:00. No evidence of surrounding infection. A snap VAC was applied today Electronic Signature(s) Signed: 03/09/2017 6:01:27 PM By: Baltazar Najjar MD Entered By: Baltazar Najjar on 03/09/2017 16:40:31 Kundrat, Mylani ([id number]) -------------------------------------------------------------------------------- Physical Exam Details Patient Name: [name] Date of Service: 03/09/2017 2:00 PM Medical Record Patient Account Number: [id number] Number: Treating RN: Clover Mealy, RN, BSN, [place] Sink 16-May-1929 (83 y.o. Other Clinician: Date of Birth/Sex: Female) Treating Cheris Tweten Primary Care Provider: SYSTEM, PCP Provider/Extender: G Referring Provider: Myrtie Neither in Treatment: 2 Constitutional Patient is hypertensive.. Pulse regular and within target range for patient.Marland Kitchen Respirations regular, non-labored and within target range.. Temperature is normal and within the target range for the patient.Marland Kitchen appears in no distress. Cardiovascular Pedal pulses palpable and strong bilaterally.. Notes Wound exam; in preparation for the [facility] the wound was then provided of surface slough and necrotic material. There is no evidence of surrounding infection however there is considerable undermining laterally. Electronic  Signature(s) Signed: 03/09/2017 6:01:27 PM By: Baltazar Najjar MD Entered By: Baltazar Najjar on 03/09/2017 16:42:03 [name], Latrisa ([id number]) -------------------------------------------------------------------------------- Physician Orders Details Patient Name: [name] Date of Service: 03/09/2017 2:00 PM Medical Record Patient Account Number: [id number] Number: Treating RN: Clover Mealy, RN, BSN, [place] Sink Nov 03, 1928 (83 y.o. Other Clinician: Date of Birth/Sex: Female) Treating Rece Zechman Primary Care Provider: SYSTEM, PCP Provider/Extender: G Referring Provider: Myrtie Neither in Treatment: 2 Verbal / Phone Orders: No Diagnosis Coding Wound Cleansing Wound #1 Right,Anterior Lower Leg o Cleanse wound with mild soap and water o May shower with protection. - MAY USE CAST PROTECTOR OR A BIG TRASH BAG OVER THE LEG AND SEAL IT OFF WITH A TAPE . DO NOT GET IT WET o No tub bath. Anesthetic Wound #1 Right,Anterior Lower Leg o Topical Lidocaine 4% cream applied to wound bed prior to debridement Skin Barriers/Peri-Wound Care Wound #1 Right,Anterior Lower Leg o Skin Prep Primary Wound Dressing Wound #1 Right,Anterior Lower Leg o Prisma Ag - to base of wound and slightly under the undermining Dressing Change Frequency Wound #1 Right,Anterior Lower Leg o Other: - as needed for drainage Follow-up Appointments Wound #1 Right,Anterior Lower Leg o Return Appointment in 1 week. Additional Orders / Instructions Wound #1 Right,Anterior Lower Leg o Increase protein intake. o Activity as tolerated Gavin, Anissa ([id number]) Negative Pressure Wound Therapy Wound #1 Right,Anterior Lower Leg o Other: - Order SNAP VAC Electronic Signature(s) Signed: 03/10/2017 5:05:29 PM By: Elpidio Eric BSN, RN Signed: 03/10/2017 5:26:18 PM By: Baltazar Najjar MD Previous Signature: 03/09/2017 4:53:20 PM Version By: Elpidio Eric BSN, RN Previous Signature: 03/09/2017 6:01:27 PM  Version By: Baltazar Najjar MD Entered By: Elpidio Eric on 03/10/2017 14:22:30 Koenig, Dyonna ([id number]) -------------------------------------------------------------------------------- Problem List Details Patient Name: [name] Date of Service: 03/09/2017 2:00 PM Medical Record Patient Account Number: [id number] Number: Treating RN: Clover Mealy, RN, BSN, [place] 08-22-1929 (83 y.o. Other Clinician: Date of Birth/Sex: Female) Treating Aerica Rincon Primary Care Provider: SYSTEM, PCP Provider/Extender: G Referring Provider: Myrtie Neither in Treatment: 2 Active Problems ICD-10 Encounter Code Description Active Date Diagnosis S81.811D Laceration without foreign body, right lower leg, 02/23/2017 Yes  subsequent encounter I87.321 Chronic venous hypertension (idiopathic) with 02/23/2017 Yes inflammation of right lower extremity Inactive Problems Resolved Problems Electronic Signature(s) Signed: 03/09/2017 6:01:27 PM By: Baltazar Najjar MD Entered By: Baltazar Najjar on 03/09/2017 16:39:33 Gully, Chantalle ([id number]) -------------------------------------------------------------------------------- Progress Note Details Patient Name: [name] Date of Service: 03/09/2017 2:00 PM Medical Record Patient Account Number: [id number] Number: Treating RN: Clover Mealy, RN, BSN, [place] 05-Mar-1929 (83 y.o. Other Clinician: Date of Birth/Sex: Female) Treating Jakira Mcfadden Primary Care Provider: SYSTEM, PCP Provider/Extender: G Referring Provider: Myrtie Neither in Treatment: 2 Subjective Chief Complaint Information obtained from Patient 02/23/17; patient is here for review of wound on the right anterior lower leg History of Present Illness (HPI) 02/23/17 this is an 83 year old woman who is at a fitness club about to sit down on a rowing machine. She fell and traumatized the anterior part of her right leg on a sharp metal surface. This caused immediate pain and bleeding.  She stopped off on her way home at an urgent care ultimately was referred to the ER and by that time she had a significant hematoma of her right leg. She was followed in the urgent orthopedic clinic. She had a duplex ultrasound of the right leg on 5/10 that was negative for DVT x-ray of the right leg on 01/28/17 was negative including a CT scan of the tib-fib. The CT scan did show the subcutaneous hematoma which on 01/28/17 measured 7.7 cm craniocaudal by 2.5 cm x 5.6 cm AP subcutaneous air was also identified felt likely secondary to small lacerations. There was no acute fracture. The patient has been continuing mostly with topical antibiotics on this wound. She did not have any compression. She is referred here from the urgent orthopedic clinic for review of this. She did not have a wound history she is not a diabetic. ABI in this clinic on the right was 1.1 on the left 1.0. She is on aspirin as her only anti-coagulant 03/02/17; the patient arrives for review of a traumatic wound on her right anterior leg with subsequent hematoma formation and considerable tissue loss on the right anterior lateral leg. We'll use silver alginate last week. We kept her under compression and the patient states that her pain was actually a lot better. Wound measures at 5 x 2.5 x 2. Consider home amount of undermining laterally by roughly 3.5 cm from 7 to 12:00 03/09/17; wound actually looks better however there is considerable undermining from roughly 7 to 12:00. No evidence of surrounding infection. A snap VAC was applied today Objective Constitutional Patient is hypertensive.. Pulse regular and within target range for patient.Marland Kitchen Respirations regular, non-labored and within target range.. Temperature is normal and within the target range for the patient.Marland Kitchen appears in no distress. [facility], [place] ([id number]) Vitals Time Taken: 2:23 PM, Height: 64 in, Weight: 138 lbs, BMI: 23.7, Temperature: 97.6 F, Pulse: 75 bpm,  Respiratory Rate: 18 breaths/min, Blood Pressure: 176/64 mmHg. Cardiovascular Pedal pulses palpable and strong bilaterally.. General Notes: Wound exam; in preparation for the [facility] the wound was then provided of surface slough and necrotic material. There is no evidence of surrounding infection however there is considerable undermining laterally. Integumentary (Hair, Skin) Wound #1 status is Open. Original cause of wound was Trauma. The wound is located on the Right,Anterior Lower Leg. The wound measures 4.4cm length x 2.2cm width x 1.5cm depth; 7.603cm^2 area and 11.404cm^3 volume. There is muscle and Fat Layer (Subcutaneous Tissue) Exposed exposed. There is no tunneling or undermining noted. There is a  large amount of serosanguineous drainage noted. The wound margin is flat and intact. There is small (1-33%) pale granulation within the wound bed. There is a large (67-100%) amount of necrotic tissue within the wound bed including Adherent Slough and Necrosis of Muscle. The periwound skin appearance exhibited: Mottled, Erythema. The periwound skin appearance did not exhibit: Callus, Crepitus, Excoriation, Induration, Rash, Scarring, Dry/Scaly, Maceration, Atrophie Blanche, Cyanosis, Ecchymosis, Hemosiderin Staining, Pallor, Rubor. The surrounding wound skin color is noted with erythema which is circumferential. Periwound temperature was noted as No Abnormality. The periwound has tenderness on palpation. Assessment Active Problems ICD-10 S81.811D - Laceration without foreign body, right lower leg, subsequent encounter I87.321 - Chronic venous hypertension (idiopathic) with inflammation of right lower extremity Procedures Wound #1 Pre-procedure diagnosis of Wound #1 is a Trauma, Other located on the Right,Anterior Lower Leg . There was a Skin/Subcutaneous Tissue Debridement ([id number]) debridement with total area of 9.68 sq cm performed by Maxwell CaulOBSON, Kenzie Thoreson G, MD. with the following  instrument(s): Curette to remove Non-Viable tissue/material including Exudate, Fat Layer (and Subcutaneous Tissue) Exposed, Fibrin/Slough, and Subcutaneous after achieving pain control using Lidocaine 4% Topical Solution. A time out was conducted at 14:59, prior to the start of the procedure. A Minimum amount of bleeding was controlled with Pressure. [facility] Inc[facility], Azeneth ([id number]) The procedure was tolerated well with a pain level of 0 throughout and a pain level of 0 following the procedure. Post Debridement Measurements: 4.4cm length x 2.2cm width x 1.5cm depth; 11.404cm^3 volume. Character of Wound/Ulcer Post Debridement requires further debridement. Post procedure Diagnosis Wound #1: Same as Pre-Procedure Plan Wound Cleansing: Wound #1 Right,Anterior Lower Leg: Cleanse wound with mild soap and water May shower with protection. - MAY USE CAST PROTECTOR OR A BIG TRASH BAG OVER THE LEG AND SEAL IT OFF WITH A TAPE . DO NOT GET IT WET No tub bath. Anesthetic: Wound #1 Right,Anterior Lower Leg: Topical Lidocaine 4% cream applied to wound bed prior to debridement Skin Barriers/Peri-Wound Care: Wound #1 Right,Anterior Lower Leg: Skin Prep Primary Wound Dressing: Wound #1 Right,Anterior Lower Leg: Prisma Ag - to base of wound and slightly under the undermining Dressing Change Frequency: Wound #1 Right,Anterior Lower Leg: Other: - as needed for drainage Follow-up Appointments: Wound #1 Right,Anterior Lower Leg: Return Appointment in 1 week. Additional Orders / Instructions: Wound #1 Right,Anterior Lower Leg: Increase protein intake. Activity as tolerated Negative Pressure Wound Therapy: Wound #1 Right,Anterior Lower Leg: Other: - Order SNAP VAC Silver collagen under snap vac Eckersley, Nakita ([id number]) The family was shown how to do this with the help of a company representative. Electronic Signature(s) Signed: 03/17/2017 8:24:38 AM By: Elliot GurneyWoody, BSN, RN, CWS, Kim RN, BSN Signed: 03/24/2017  5:07:07 PM By: Baltazar Najjarobson, Elsworth Ledin MD Previous Signature: 03/09/2017 6:01:27 PM Version By: Baltazar Najjarobson, Wheeler Incorvaia MD Entered By: Elliot GurneyWoody, BSN, RN, CWS, Kim on 03/17/2017 08:24:38 [name] CollumBERMAN, Keishawn ([id number]) -------------------------------------------------------------------------------- SuperBill Details Patient Name: [name] CollumBERMAN, Armanie Date of Service: 03/09/2017 Medical Record Patient Account Number: [id number] Number: Treating RN: Clover MealyAfful, RN, BSN, [place] 11/09/1928 (83 y.o. Other Clinician: Date of Birth/Sex: Female) Treating Kerigan Narvaez Primary Care Provider: SYSTEM, PCP Provider/Extender: G Referring Provider: Myrtie NeitherJONES, MAURICE Weeks in Treatment: 2 Diagnosis Coding ICD-10 Codes Code Description S81.811D Laceration without foreign body, right lower leg, subsequent encounter I87.321 Chronic venous hypertension (idiopathic) with inflammation of right lower extremity Facility Procedures CPT4 Code Description: [id number] 11042 - DEB SUBQ TISSUE 20 SQ CM/< ICD-10 Description Diagnosis S81.811D Laceration without foreign body, right lower leg, s Modifier: ubsequent enc Quantity: 1  ounter CPT4 Code Description: [id number] NEG PRESS WND TX <=50 SQ CM Modifier: Quantity: 1 Physician Procedures CPT4 Code Description: [id number] 11042 - WC PHYS SUBQ TISS 20 SQ CM ICD-10 Description Diagnosis S81.811D Laceration without foreign body, right lower leg, Modifier: subsequent enc Quantity: 1 ounter Electronic Signature(s) Signed: 03/09/2017 5:08:17 PM By: Elpidio Eric BSN, RN Signed: 03/09/2017 6:01:27 PM By: Baltazar Najjar MD Entered By: Elpidio Eric on 03/09/2017 17:08:17

## 2017-03-12 NOTE — Progress Notes (Signed)
[name], [name] ([id number]) Visit Report for 03/10/2017 Arrival Information Details Patient Name: [name], [name] Date of Service: 03/10/2017 2:00 PM Medical Record Patient Account Number: [id number] Number: Treating RN: Huel CoventryWoody, Kim 06/29/1929 (83 y.o. Other Clinician: Date of Birth/Sex: Female) Treating ROBSON, MICHAEL Primary Care Koi Yarbro: SYSTEM, PCP Icyss Skog/Extender: G Referring Tammey Deeg: Myrtie NeitherJONES, MAURICE Weeks in Treatment: 2 Visit Information History Since Last Visit Added or deleted any medications: No Patient Arrived: Ambulatory Any new allergies or adverse reactions: No Arrival Time: 14:14 Had a fall or experienced change in No Accompanied By: daughter activities of daily living that may affect Transfer Assistance: None risk of falls: Patient Identification Verified: Yes Signs or symptoms of abuse/neglect since last No Secondary Verification Process Yes visito Completed: Hospitalized since last visit: No Patient Requires Transmission-Based No Has Dressing in Place as Prescribed: Yes Precautions: Pain Present Now: Yes Patient Has Alerts: No Electronic Signature(s) Signed: 03/10/2017 9:16:41 PM By: Elliot GurneyWoody, BSN, RN, CWS, Kim RN, BSN Entered By: Elliot GurneyWoody, BSN, RN, CWS, Kim on 03/10/2017 14:14:39 Collyer, Wanza ([id number]) -------------------------------------------------------------------------------- Encounter Discharge Information Details Patient Name: [name], [name] Date of Service: 03/10/2017 2:00 PM Medical Record Patient Account Number: [id number] Number: Treating RN: Huel CoventryWoody, Kim 05/02/1929 (83 y.o. Other Clinician: Date of Birth/Sex: Female) Treating ROBSON, MICHAEL Primary Care Agueda Houpt: SYSTEM, PCP Aadyn Buchheit/Extender: G Referring Mahina Salatino: Myrtie NeitherJONES, MAURICE Weeks in Treatment: 2 Encounter Discharge Information Items Discharge Pain Level: 0 Discharge Condition: Stable Ambulatory Status: Ambulatory Discharge Destination:  Home Private Transportation: Auto Accompanied By: daughter Schedule Follow-up Appointment: Yes Medication Reconciliation completed and Yes provided to Patient/Care Shawnika Pepin: Clinical Summary of Care: Electronic Signature(s) Signed: 03/10/2017 9:16:41 PM By: Elliot GurneyWoody, BSN, RN, CWS, Kim RN, BSN Entered By: Elliot GurneyWoody, BSN, RN, CWS, Kim on 03/10/2017 14:34:11 Tome, Willodene ([id number]) -------------------------------------------------------------------------------- Negative Pressure Wound Therapy Application (NPWT) Details Patient Name: [name], [name] Date of Service: 03/10/2017 2:00 PM Medical Record Patient Account Number: [id number] Number: Treating RN: Huel CoventryWoody, Kim 02/21/1929 (83 y.o. Other Clinician: Date of Birth/Sex: Female) Treating ROBSON, MICHAEL Primary Care Rula [place]: SYSTEM, PCP Devondre Guzzetta/Extender: G Referring Socrates Cahoon: Myrtie NeitherJONES, MAURICE Weeks in Treatment: 2 NPWT Application Performed for: Wound #1 Right,Anterior Lower Leg Performed By: Huel CoventryWoody, Kim, RN Type: Other Coverage Size (sq cm): 8.4 Pressure Type: Constant Pressure Setting: 125 mmHG Drain Type: None Quantity of Sponges/Gauze Inserted: 1 Date Initiated: 03/08/2017 Response to Treatment: Snap Vac applied. Patient tolerated well. 1 piece blue foam. Electronic Signature(s) Signed: 03/10/2017 9:16:41 PM By: Elliot GurneyWoody, BSN, RN, CWS, Kim RN, BSN Entered By: Elliot GurneyWoody, BSN, RN, CWS, Kim on 03/10/2017 14:32:08 [name], [name] ([id number]) -------------------------------------------------------------------------------- Patient/Caregiver Education Details Patient Name: [name], [name] Date of Service: 03/10/2017 2:00 PM Medical Record Patient Account Number: [id number] Number: Treating RN: Huel CoventryWoody, Kim 03/05/1929 (83 y.o. Other Clinician: Date of Birth/Gender: Female) Treating ROBSON, MICHAEL Primary Care Physician: SYSTEM, PCP Physician/Extender: G Referring Physician: Myrtie NeitherJONES, MAURICE Weeks in Treatment: 2 Education  Assessment Education Provided To: Patient Education Topics Provided Wound/Skin Impairment: Handouts: Other: continue vac therapy Electronic Signature(s) Signed: 03/10/2017 9:16:41 PM By: Elliot GurneyWoody, BSN, RN, CWS, Kim RN, BSN Entered By: Elliot GurneyWoody, BSN, RN, CWS, Kim on 03/10/2017 14:33:54 [name], Ryian ([id number]) -------------------------------------------------------------------------------- Wound Assessment Details Patient Name: [name], [name] Date of Service: 03/10/2017 2:00 PM Medical Record Patient Account Number: [id number] Number: Treating RN: Huel CoventryWoody, Kim 07/26/1929 (83 y.o. Other Clinician: Date of Birth/Sex: Female) Treating ROBSON, MICHAEL Primary Care Dionis Autry: SYSTEM, PCP Jaeleen Inzunza/Extender: G Referring Kezia Benevides: Altamese CabalJONES, MAURICE Weeks in Treatment: 2 Wound Status Wound Number: 1 Primary Trauma, Other Etiology: Wound Location: Right Lower Leg -  Anterior Wound Open Wounding Event: Trauma Status: Date Acquired: 01/28/2017 Comorbid Cataracts, Arrhythmia, Congestive Weeks Of Treatment: 2 History: Heart Failure, Hypertension, Clustered Wound: No Osteoarthritis Photos Photo Uploaded By: Elliot Gurney, BSN, RN, CWS, Kim on 03/11/2017 12:13:42 Wound Measurements Length: (cm) 3 % Reduction in A Width: (cm) 2.8 % Reduction in V Depth: (cm) 0.6 Epithelializatio Area: (cm) 6.597 Tunneling: Volume: (cm) 3.958 Undermining: Starting Posi Ending Positi Maximum Dista rea: 32.8% olume: 86.6% n: None No Yes tion (o'clock): 7 on (o'clock): 2 nce: (cm) 1.8 Wound Description Full Thickness With Exposed Classification: Support Structures Wound Margin: Flat and Intact Exudate Large Amount: Exudate Type: Serosanguineous Exudate Color: red, brown Garcilazo, [name] ([id number]) Foul Odor After Cleansing: No Slough/Fibrino Yes Wound Bed Granulation Amount: Medium (34-66%) Exposed Structure Granulation Quality: Pale Fascia Exposed: No Necrotic Amount: Medium (34-66%) Fat Layer  (Subcutaneous Tissue) Exposed: Yes Necrotic Quality: Adherent Slough Tendon Exposed: No Muscle Exposed: Yes Necrosis of Muscle: Yes Joint Exposed: No Bone Exposed: No Periwound Skin Texture Texture Color No Abnormalities Noted: No No Abnormalities Noted: No Callus: No Atrophie Blanche: No Crepitus: No Cyanosis: No Excoriation: No Ecchymosis: No Induration: No Erythema: Yes Rash: No Erythema Location: Circumferential Scarring: No Hemosiderin Staining: No Mottled: Yes Moisture Pallor: No No Abnormalities Noted: No Rubor: No Dry / Scaly: No Maceration: No Temperature / Pain Temperature: No Abnormality Tenderness on Palpation: Yes Wound Preparation Ulcer Cleansing: Rinsed/Irrigated with Saline Topical Anesthetic Applied: Other: lidocaine 4%, Treatment Notes Wound #1 (Right, Anterior Lower Leg) Notes SNAP VAC applied Electronic Signature(s) Signed: 03/10/2017 9:16:41 PM By: Elliot Gurney, BSN, RN, CWS, Kim RN, BSN Entered By: Elliot Gurney, BSN, RN, CWS, Kim on 03/10/2017 14:17:34

## 2017-03-17 ENCOUNTER — Encounter: Payer: Medicare Other | Admitting: Internal Medicine

## 2017-03-17 DIAGNOSIS — I87321 Chronic venous hypertension (idiopathic) with inflammation of right lower extremity: Secondary | ICD-10-CM | POA: Diagnosis not present

## 2017-03-19 NOTE — Progress Notes (Signed)
SKYLAN, GIFT ([id number]) Visit Report for 03/17/2017 Chief Complaint Document Details Patient Name: [name], [name] Date of Service: 03/17/2017 3:00 PM Medical Record Patient Account Number: [id number] Number: Treating RN: Huel Coventry 1929-04-06 (83 y.o. Other Clinician: Date of Birth/Sex: Female) Treating ROBSON, MICHAEL Primary Care Provider: SYSTEM, PCP Provider/Extender: G Referring Provider: Myrtie Neither in Treatment: 3 Information Obtained from: Patient Chief Complaint 02/23/17; patient is here for review of wound on the right anterior lower leg Electronic Signature(s) Signed: 03/17/2017 5:43:18 PM By: Baltazar Najjar MD Entered By: Baltazar Najjar on 03/17/2017 15:47:32 [name], [name] ([id number]) -------------------------------------------------------------------------------- HPI Details Patient Name: [name] Date of Service: 03/17/2017 3:00 PM Medical Record Patient Account Number: [id number] Number: Treating RN: Huel Coventry 04-01-1929 (83 y.o. Other Clinician: Date of Birth/Sex: Female) Treating ROBSON, MICHAEL Primary Care Provider: SYSTEM, PCP Provider/Extender: G Referring Provider: Myrtie Neither in Treatment: 3 History of Present Illness HPI Description: 02/23/17 this is an 83 year old woman who is at a fitness club about to sit down on a rowing machine. She fell and traumatized the anterior part of her right leg on a sharp metal surface. This caused immediate pain and bleeding. She stopped off on her way home at an urgent care ultimately was referred to the ER and by that time she had a significant hematoma of her right leg. She was followed in the urgent orthopedic clinic. She had a duplex ultrasound of the right leg on 5/10 that was negative for DVT x-ray of the right leg on 01/28/17 was negative including a CT scan of the tib-fib. The CT scan did show the subcutaneous hematoma which on 01/28/17 measured 7.7 cm craniocaudal by 2.5 cm x 5.6  cm AP subcutaneous air was also identified felt likely secondary to small lacerations. There was no acute fracture. The patient has been continuing mostly with topical antibiotics on this wound. She did not have any compression. She is referred here from the urgent orthopedic clinic for review of this. She did not have a wound history she is not a diabetic. ABI in this clinic on the right was 1.1 on the left 1.0. She is on aspirin as her only anti-coagulant 03/02/17; the patient arrives for review of a traumatic wound on her right anterior leg with subsequent hematoma formation and considerable tissue loss on the right anterior lateral leg. We'll use silver alginate last week. We kept her under compression and the patient states that her pain was actually a lot better. Wound measures at 5 x 2.5 x 2. Consider home amount of undermining laterally by roughly 3.5 cm from 7 to 12:00 03/09/17; wound actually looks better however there is considerable undermining from roughly 7 to 12:00. No evidence of surrounding infection. A snap VAC was applied today 03/17/17; the patient continues to do quite well. There have been some logistic problems with the wound VAC however we have managed to keep this in place. She did fill out one of the canisters but didn't come in in follow-up. We've been using silver collagen under a snap VAC Electronic Signature(s) Signed: 03/17/2017 5:43:18 PM By: Baltazar Najjar MD Entered By: Baltazar Najjar on 03/17/2017 15:48:27 [name], [name] ([id number]) -------------------------------------------------------------------------------- Physical Exam Details Patient Name: [name] Date of Service: 03/17/2017 3:00 PM Medical Record Patient Account Number: [id number] Number: Treating RN: Huel Coventry September 08, 1929 (83 y.o. Other Clinician: Date of Birth/Sex: Female) Treating ROBSON, MICHAEL Primary Care Provider: SYSTEM, PCP Provider/Extender: G Referring Provider: Altamese Cabal Weeks in Treatment: 3 Constitutional Sitting or  standing Blood Pressure is within target range for patient.. Pulse regular and within target range for patient.Marland Kitchen Respirations regular, non-labored and within target range.. Temperature is normal and within the target range for the patient.Marland Kitchen appears in no distress. Cardiovascular Pedal pulses palpable and strong bilaterally.. Notes Wound exam; the patient continues to have undermining from 7 to 12:00. It would appear that the baseline granulation has come in a bit and the wound in general has contracted in all directions. Very impressive result for one week of negative pressure wound therapy Electronic Signature(s) Signed: 03/17/2017 5:43:18 PM By: Baltazar Najjar MD Entered By: Baltazar Najjar on 03/17/2017 15:49:25 [name], [name] ([id number]) -------------------------------------------------------------------------------- Physician Orders Details Patient Name: [name] Date of Service: 03/17/2017 3:00 PM Medical Record Patient Account Number: [id number] Number: Treating RN: Huel Coventry 1929-09-14 (83 y.o. Other Clinician: Date of Birth/Sex: Female) Treating ROBSON, MICHAEL Primary Care Provider: SYSTEM, PCP Provider/Extender: G Referring Provider: Myrtie Neither in Treatment: 3 Verbal / Phone Orders: No Diagnosis Coding Wound Cleansing Wound #1 Right,Anterior Lower Leg o Cleanse wound with mild soap and water o May shower with protection. - MAY USE CAST PROTECTOR OR A BIG TRASH BAG OVER THE LEG AND SEAL IT OFF WITH A TAPE . DO NOT GET IT WET o No tub bath. Anesthetic Wound #1 Right,Anterior Lower Leg o Topical Lidocaine 4% cream applied to wound bed prior to debridement Skin Barriers/Peri-Wound Care Wound #1 Right,Anterior Lower Leg o Skin Prep Primary Wound Dressing Wound #1 Right,Anterior Lower Leg o Prisma Ag - to base of wound and slightly under the undermining Dressing Change  Frequency Wound #1 Right,Anterior Lower Leg o Other: - as needed for drainage Follow-up Appointments Wound #1 Right,Anterior Lower Leg o Return Appointment in 1 week. Additional Orders / Instructions Wound #1 Right,Anterior Lower Leg o Increase protein intake. o Activity as tolerated [name], [name] ([id number]) Negative Pressure Wound Therapy Wound #1 Right,Anterior Lower Leg o Other: - Order SNAP VAC Electronic Signature(s) Signed: 03/17/2017 5:03:50 PM By: Elliot Gurney, BSN, RN, CWS, Kim RN, BSN Signed: 03/17/2017 5:43:18 PM By: Baltazar Najjar MD Entered By: Elliot Gurney, BSN, RN, CWS, Kim on 03/17/2017 15:42:02 [name], [name] ([id number]) -------------------------------------------------------------------------------- Problem List Details Patient Name: [name] Date of Service: 03/17/2017 3:00 PM Medical Record Patient Account Number: [id number] Number: Treating RN: Huel Coventry 03-30-1929 (83 y.o. Other Clinician: Date of Birth/Sex: Female) Treating ROBSON, MICHAEL Primary Care Provider: SYSTEM, PCP Provider/Extender: G Referring Provider: Myrtie Neither in Treatment: 3 Active Problems ICD-10 Encounter Code Description Active Date Diagnosis S81.811D Laceration without foreign body, right lower leg, 02/23/2017 Yes subsequent encounter I87.321 Chronic venous hypertension (idiopathic) with 02/23/2017 Yes inflammation of right lower extremity Inactive Problems Resolved Problems Electronic Signature(s) Signed: 03/17/2017 5:43:18 PM By: Baltazar Najjar MD Entered By: Baltazar Najjar on 03/17/2017 15:46:57 [name], [name] ([id number]) -------------------------------------------------------------------------------- Progress Note Details Patient Name: [name] Date of Service: 03/17/2017 3:00 PM Medical Record Patient Account Number: [id number] Number: Treating RN: Huel Coventry 06/07/1929 (83 y.o. Other Clinician: Date of Birth/Sex: Female) Treating ROBSON,  MICHAEL Primary Care Provider: SYSTEM, PCP Provider/Extender: G Referring Provider: Myrtie Neither in Treatment: 3 Subjective Chief Complaint Information obtained from Patient 02/23/17; patient is here for review of wound on the right anterior lower leg History of Present Illness (HPI) 02/23/17 this is an 83 year old woman who is at a fitness club about to sit down on a rowing machine. She fell and traumatized the anterior part of her right leg on a sharp metal surface. This caused  immediate pain and bleeding. She stopped off on her way home at an urgent care ultimately was referred to the ER and by that time she had a significant hematoma of her right leg. She was followed in the urgent orthopedic clinic. She had a duplex ultrasound of the right leg on 5/10 that was negative for DVT x-ray of the right leg on 01/28/17 was negative including a CT scan of the tib-fib. The CT scan did show the subcutaneous hematoma which on 01/28/17 measured 7.7 cm craniocaudal by 2.5 cm x 5.6 cm AP subcutaneous air was also identified felt likely secondary to small lacerations. There was no acute fracture. The patient has been continuing mostly with topical antibiotics on this wound. She did not have any compression. She is referred here from the urgent orthopedic clinic for review of this. She did not have a wound history she is not a diabetic. ABI in this clinic on the right was 1.1 on the left 1.0. She is on aspirin as her only anti-coagulant 03/02/17; the patient arrives for review of a traumatic wound on her right anterior leg with subsequent hematoma formation and considerable tissue loss on the right anterior lateral leg. We'll use silver alginate last week. We kept her under compression and the patient states that her pain was actually a lot better. Wound measures at 5 x 2.5 x 2. Consider home amount of undermining laterally by roughly 3.5 cm from 7 to 12:00 03/09/17; wound actually looks better however  there is considerable undermining from roughly 7 to 12:00. No evidence of surrounding infection. A snap VAC was applied today 03/17/17; the patient continues to do quite well. There have been some logistic problems with the wound VAC however we have managed to keep this in place. She did fill out one of the canisters but didn't come in in follow-up. We've been using silver collagen under a snap VAC Objective Constitutional Sitting or standing Blood Pressure is within target range for patient.. Pulse regular and within target range [name], [name] ([id number]) for patient.Marland Kitchen Respirations regular, non-labored and within target range.. Temperature is normal and within the target range for the patient.Marland Kitchen appears in no distress. Vitals Time Taken: 3:01 PM, Height: 64 in, Weight: 138 lbs, BMI: 23.7, Temperature: 97.8 F, Pulse: 78 bpm, Respiratory Rate: 16 breaths/min, Blood Pressure: 135/79 mmHg. Cardiovascular Pedal pulses palpable and strong bilaterally.. General Notes: Wound exam; the patient continues to have undermining from 7 to 12:00. It would appear that the baseline granulation has come in a bit and the wound in general has contracted in all directions. Very impressive result for one week of negative pressure wound therapy Integumentary (Hair, Skin) Wound #1 status is Open. Original cause of wound was Trauma. The wound is located on the Right,Anterior Lower Leg. The wound measures 2cm length x 2cm width x 0.5cm depth; 3.142cm^2 area and 1.571cm^3 volume. There is muscle and Fat Layer (Subcutaneous Tissue) Exposed exposed. There is undermining starting at 7:00 and ending at 1:00 with a maximum distance of 1.8cm. There is a large amount of serosanguineous drainage noted. The wound margin is flat and intact. There is medium (34-66%) pale granulation within the wound bed. There is a medium (34-66%) amount of necrotic tissue within the wound bed including Adherent Slough and Necrosis of Muscle. The  periwound skin appearance exhibited: Mottled, Erythema. The periwound skin appearance did not exhibit: Callus, Crepitus, Excoriation, Induration, Rash, Scarring, Dry/Scaly, Maceration, Atrophie Blanche, Cyanosis, Ecchymosis, Hemosiderin Staining, Pallor, Rubor. The surrounding wound skin  color is noted with erythema which is circumferential. Periwound temperature was noted as No Abnormality. The periwound has tenderness on palpation. Assessment Active Problems ICD-10 S81.811D - Laceration without foreign body, right lower leg, subsequent encounter I87.321 - Chronic venous hypertension (idiopathic) with inflammation of right lower extremity Plan Wound Cleansing: Wound #1 Right,Anterior Lower Leg: Cleanse wound with mild soap and water May shower with protection. - MAY USE CAST PROTECTOR OR A BIG TRASH BAG OVER THE LEG [name], [name] ([id number]) AND SEAL IT OFF WITH A TAPE . DO NOT GET IT WET No tub bath. Anesthetic: Wound #1 Right,Anterior Lower Leg: Topical Lidocaine 4% cream applied to wound bed prior to debridement Skin Barriers/Peri-Wound Care: Wound #1 Right,Anterior Lower Leg: Skin Prep Primary Wound Dressing: Wound #1 Right,Anterior Lower Leg: Prisma Ag - to base of wound and slightly under the undermining Dressing Change Frequency: Wound #1 Right,Anterior Lower Leg: Other: - as needed for drainage Follow-up Appointments: Wound #1 Right,Anterior Lower Leg: Return Appointment in 1 week. Additional Orders / Instructions: Wound #1 Right,Anterior Lower Leg: Increase protein intake. Activity as tolerated Negative Pressure Wound Therapy: Wound #1 Right,Anterior Lower Leg: Other: - Order SNAP VAC #1 continue the same dressing silver collagen, snapvac #2 generally very impressive result for only one week of therapy Electronic Signature(s) Signed: 03/17/2017 5:43:18 PM By: Baltazar Najjarobson, Michael MD Entered By: Baltazar Najjarobson, Michael on 03/17/2017 15:49:58 [name], [name]  ([id number]) -------------------------------------------------------------------------------- SuperBill Details Patient Name: [name] CollumBERMAN, [name] Date of Service: 03/17/2017 Medical Record Patient Account Number: [id number] Number: Treating RN: Huel CoventryWoody, Kim 03/18/1929 (83 y.o. Other Clinician: Date of Birth/Sex: Female) Treating ROBSON, MICHAEL Primary Care Provider: SYSTEM, PCP Provider/Extender: G Referring Provider: Myrtie NeitherJONES, MAURICE Weeks in Treatment: 3 Diagnosis Coding ICD-10 Codes Code Description S81.811D Laceration without foreign body, right lower leg, subsequent encounter I87.321 Chronic venous hypertension (idiopathic) with inflammation of right lower extremity Facility Procedures CPT4 Code: [id number] Description: 97607 NEG PRESS WND TX <=50 SQ CM Modifier: Quantity: 1 Physician Procedures CPT4 Code Description: [id number] - WC PHYS LEVEL 2 - EST PT ICD-10 Description Diagnosis S81.811D Laceration without foreign body, right lower leg, Modifier: subsequent enc Quantity: 1 ounter Electronic Signature(s) Signed: 03/17/2017 5:43:18 PM By: Baltazar Najjarobson, Michael MD Entered By: Baltazar Najjarobson, Michael on 03/17/2017 15:50:36

## 2017-03-19 NOTE — Progress Notes (Signed)
[name], [name] ([id number]) Visit Report for 03/17/2017 Arrival Information Details Patient Name: [name], [name] Date of Service: 03/17/2017 3:00 PM Medical Record Number: [id number] Patient Account Number: [id number] Date of Birth/Sex: 01/24/1929 (83 y.o. Female) Treating RN: Huel CoventryWoody, Kim Primary Care Aiya Keach: SYSTEM, PCP Other Clinician: Referring Elishia Kaczorowski: [name], [name] Treating Jilliam Bellmore/Extender: [name], [name] G Weeks in Treatment: 3 Visit Information History Since Last Visit Added or deleted any medications: No Patient Arrived: Cane Any new allergies or adverse reactions: No Arrival Time: 15:01 Had a fall or experienced change in No Accompanied By: daughter activities of daily living that may affect Transfer Assistance: None risk of falls: Patient Identification Verified: Yes Signs or symptoms of abuse/neglect since last No Secondary Verification Process Yes visito Completed: Hospitalized since last visit: No Patient Requires Transmission-Based No Has Dressing in Place as Prescribed: Yes Precautions: Pain Present Now: No Patient Has Alerts: No Electronic Signature(s) Signed: 03/17/2017 5:03:50 PM By: Elliot GurneyWoody, BSN, RN, CWS, Kim RN, BSN Entered By: Elliot GurneyWoody, BSN, RN, CWS, Kim on 03/17/2017 15:01:41 [place], [name] ([id number]) -------------------------------------------------------------------------------- Encounter Discharge Information Details Patient Name: [name], [name] Date of Service: 03/17/2017 3:00 PM Medical Record Number: [id number] Patient Account Number: [id number] Date of Birth/Sex: 04/07/1929 (83 y.o. Female) Treating RN: Huel CoventryWoody, Kim Primary Care Gearl Kimbrough: SYSTEM, PCP Other Clinician: Referring Clarabelle Oscarson: [name], [name] Treating Dewain Platz/Extender: [name], [name] G Weeks in Treatment: 3 Encounter Discharge Information Items Discharge Pain Level: 0 Discharge Condition: Stable Ambulatory Status: Ambulatory Discharge Destination: Home Transportation: Private  Auto Accompanied By: daughter Schedule Follow-up Appointment: Yes Medication Reconciliation completed and provided to Patient/Care Yes Kayte Borchard: Provided on Clinical Summary of Care: 03/17/2017 Form Type Recipient Paper Patient RB Electronic Signature(s) Signed: 03/17/2017 5:03:50 PM By: Elliot GurneyWoody, BSN, RN, CWS, Kim RN, BSN Previous Signature: 03/17/2017 3:43:46 PM Version By: Gwenlyn PerkingMoore, Shelia Entered By: Elliot GurneyWoody, BSN, RN, CWS, Kim on 03/17/2017 15:44:45 [place] ManorBERMAN, [name] ([id number]) -------------------------------------------------------------------------------- Lower Extremity Assessment Details Patient Name: [name], [name] Date of Service: 03/17/2017 3:00 PM Medical Record Number: [id number] Patient Account Number: [id number] Date of Birth/Sex: 04/18/1929 (83 y.o. Female) Treating RN: Huel CoventryWoody, Kim Primary Care Romney Compean: SYSTEM, PCP Other Clinician: Referring Mischa Pollard: [name], [name] Treating Brinlyn Cena/Extender: [name], [name] G Weeks in Treatment: 3 Edema Assessment Assessed: [Left: No] [Right: No] E[Left: dema] [Right: :] Calf Left: Right: Point of Measurement: 28 cm From Medial Instep cm 35.5 cm Ankle Left: Right: Point of Measurement: 8 cm From Medial Instep cm 21 cm Vascular Assessment Claudication: Claudication Assessment [Right:None] Pulses: Dorsalis Pedis Palpable: [Right:Yes] Posterior Tibial Extremity colors, hair growth, and conditions: Extremity Color: [Right:Hyperpigmented] Hair Growth on Extremity: [Right:Yes] Temperature of Extremity: [Right:Warm] Capillary Refill: [Right:< 3 seconds] Dependent Rubor: [Right:No] Blanched when Elevated: [Right:No] Lipodermatosclerosis: [Right:No] Toe Nail Assessment Left: Right: Thick: No Discolored: No Deformed: No Improper Length and Hygiene: No [name], [name] ([id number]) Electronic Signature(s) Signed: 03/17/2017 5:03:50 PM By: Elliot GurneyWoody, BSN, RN, CWS, Kim RN, BSN Entered By: Elliot GurneyWoody, BSN, RN, CWS, Kim on 03/17/2017 15:10:11 [name],  [name] ([id number]) -------------------------------------------------------------------------------- Multi Wound Chart Details Patient Name: [name], [name] Date of Service: 03/17/2017 3:00 PM Medical Record Number: [id number] Patient Account Number: [id number] Date of Birth/Sex: 11/22/1928 (83 y.o. Female) Treating RN: Huel CoventryWoody, Kim Primary Care Mihir Flanigan: SYSTEM, PCP Other Clinician: Referring Yaileen Hofferber: [name], [name] Treating Kairen Hallinan/Extender: [name], [name] G Weeks in Treatment: 3 Vital Signs Height(in): 64 Pulse(bpm): 78 Weight(lbs): 138 Blood Pressure 135/79 (mmHg): Body Mass Index(BMI): 24 Temperature(F): 97.8 Respiratory Rate 16 (breaths/min): Photos: [N/A:N/A] Wound Location: Right Lower Leg - Anterior N/A N/A Wounding Event: Trauma N/A N/A Primary Etiology: Trauma, Other N/A  N/A Comorbid History: Cataracts, Arrhythmia, N/A N/A Congestive Heart Failure, Hypertension, Osteoarthritis Date Acquired: 01/28/2017 N/A N/A Weeks of Treatment: 3 N/A N/A Wound Status: Open N/A N/A Measurements L x W x D 2x2x0.5 N/A N/A (cm) Area (cm) : 3.142 N/A N/A Volume (cm) : 1.571 N/A N/A % Reduction in Area: 68.00% N/A N/A % Reduction in Volume: 94.70% N/A N/A Starting Position 1 7 (o'clock): Ending Position 1 1 (o'clock): Maximum Distance 1 1.8 (cm): Undermining: Yes N/A N/A Classification: N/A N/A [name], [name] ([id number]) Full Thickness With Exposed Support Structures Exudate Amount: Large N/A N/A Exudate Type: Serosanguineous N/A N/A Exudate Color: red, brown N/A N/A Wound Margin: Flat and Intact N/A N/A Granulation Amount: Medium (34-66%) N/A N/A Granulation Quality: Pale N/A N/A Necrotic Amount: Medium (34-66%) N/A N/A Exposed Structures: Fat Layer (Subcutaneous N/A N/A Tissue) Exposed: Yes Muscle: Yes Fascia: No Tendon: No Joint: No Bone: No Epithelialization: None N/A N/A Periwound Skin Texture: Excoriation: No N/A N/A Induration: No Callus: No Crepitus:  No Rash: No Scarring: No Periwound Skin Maceration: No N/A N/A Moisture: Dry/Scaly: No Periwound Skin Color: Erythema: Yes N/A N/A Mottled: Yes Atrophie Blanche: No Cyanosis: No Ecchymosis: No Hemosiderin Staining: No Pallor: No Rubor: No Erythema Location: Circumferential N/A N/A Temperature: No Abnormality N/A N/A Tenderness on Yes N/A N/A Palpation: Wound Preparation: Ulcer Cleansing: N/A N/A Rinsed/Irrigated with Saline Topical Anesthetic Applied: Other: lidocaine 4% Treatment Notes Wound #1 (Right, Anterior Lower Leg) 1. Cleansed with: Redd, Idonna ([id number]) Clean wound with Normal Saline 2. Anesthetic Topical Lidocaine 4% cream to wound bed prior to debridement 4. Dressing Applied: Prisma Ag Notes SNAP VAC applied with one piece of blue foam Electronic Signature(s) Signed: 03/17/2017 5:43:18 PM By: Baltazar Najjar MD Entered By: Baltazar Najjar on 03/17/2017 15:47:06 Maglione, Daionna ([id number]) -------------------------------------------------------------------------------- Multi-Disciplinary Care Plan Details Patient Name: [name] Date of Service: 03/17/2017 3:00 PM Medical Record Number: [id number] Patient Account Number: [id number] Date of Birth/Sex: 1929/02/01 (83 y.o. Female) Treating RN: Huel Coventry Primary Care Aliegha Paullin: SYSTEM, PCP Other Clinician: Referring Ionna Avis: [name] Cabal Treating Halston Fairclough/Extender: [name] [place] in Treatment: 3 Active Inactive ` Abuse / Safety / Falls / Self Care Management Nursing Diagnoses: History of Falls Impaired home maintenance Impaired physical mobility Potential for falls Potential for injury related to transfers Goals: Patient will not develop complications from immobility Date Initiated: 02/23/2017 Target Resolution Date: 07/26/2017 Goal Status: Active Patient will not experience any injury related to falls Date Initiated: 02/23/2017 Target Resolution Date: 07/26/2017 Goal Status:  Active Patient will remain injury free related to falls Date Initiated: 02/23/2017 Target Resolution Date: 07/26/2017 Goal Status: Active Patient/caregiver will demonstrate safe use of adaptive devices to increase mobility Date Initiated: 02/23/2017 Target Resolution Date: 07/26/2017 Goal Status: Active Patient/caregiver will identify factors that restrict self-care and home management Date Initiated: 02/23/2017 Target Resolution Date: 07/26/2017 Goal Status: Active Patient/caregiver will verbalize understanding of skin care regimen Date Initiated: 02/23/2017 Target Resolution Date: 07/26/2017 Goal Status: Active Patient/caregiver will verbalize understanding of the importance to maintain current immunizations/vaccinations Date Initiated: 02/23/2017 Target Resolution Date: 07/26/2017 Goal Status: Active Patient/caregiver will verbalize/demonstrate measure taken to improve self care Date Initiated: 02/23/2017 Target Resolution Date: 07/26/2017 SHANESHA, BEDNARZ ([id number]) Goal Status: Active Patient/caregiver will verbalize/demonstrate measures taken to improve the patient's personal safety Date Initiated: 02/23/2017 Target Resolution Date: 07/26/2017 Goal Status: Active Patient/caregiver will verbalize/demonstrate measures taken to prevent injury and/or falls Date Initiated: 02/23/2017 Target Resolution Date: 07/26/2017 Goal Status: Active Patient/caregiver will verbalize/demonstrate understanding of what to do in  case of emergency Date Initiated: 02/23/2017 Target Resolution Date: 07/26/2017 Goal Status: Active Interventions: Call light and/or bell within patient's reach Assess Activities of Daily Living upon admission and as needed Assess fall risk on admission and as needed Assess: immobility, friction, shearing, incontinence upon admission and as needed Assess impairment of mobility on admission and as needed per policy Assess personal safety and home safety (as indicated) on  admission and as needed Assess self care needs on admission and as needed Provide education on basic hygiene Provide education on fall prevention Provide education on personal and home safety Provide education on safe transfers Treatment Activities: Education provided on Basic Hygiene : 03/09/2017 Notes: ` Orientation to the Wound Care Program Nursing Diagnoses: Knowledge deficit related to the wound healing center program Goals: Patient/caregiver will verbalize understanding of the Wound Healing Center Program Date Initiated: 02/23/2017 Target Resolution Date: 07/26/2017 Goal Status: Active Interventions: Provide education on orientation to the wound center Notes: ` TROSPER, Matilde ([id number]) Wound/Skin Impairment Nursing Diagnoses: Impaired tissue integrity Knowledge deficit related to ulceration/compromised skin integrity Goals: Patient/caregiver will verbalize understanding of skin care regimen Date Initiated: 02/23/2017 Target Resolution Date: 07/26/2017 Goal Status: Active Ulcer/skin breakdown will have a volume reduction of 30% by week 4 Date Initiated: 02/23/2017 Target Resolution Date: 07/26/2017 Goal Status: Active Ulcer/skin breakdown will have a volume reduction of 50% by week 8 Date Initiated: 02/23/2017 Target Resolution Date: 07/26/2017 Goal Status: Active Ulcer/skin breakdown will have a volume reduction of 80% by week 12 Date Initiated: 02/23/2017 Target Resolution Date: 07/26/2017 Goal Status: Active Ulcer/skin breakdown will heal within 14 weeks Date Initiated: 02/23/2017 Target Resolution Date: 07/26/2017 Goal Status: Active Interventions: Assess patient/caregiver ability to obtain necessary supplies Assess patient/caregiver ability to perform ulcer/skin care regimen upon admission and as needed Assess ulceration(s) every visit Provide education on ulcer and skin care Treatment Activities: Referred to DME Olivia Pavelko for dressing supplies :  02/23/2017 Skin care regimen initiated : 02/23/2017 Topical wound management initiated : 02/23/2017 Notes: Electronic Signature(s) Signed: 03/17/2017 5:03:50 PM By: Elliot Gurney, BSN, RN, CWS, Kim RN, BSN Entered By: Elliot Gurney, BSN, RN, CWS, Kim on 03/17/2017 15:10:20 [place], Landon ([id number]) -------------------------------------------------------------------------------- Pain Assessment Details Patient Name: [name] Date of Service: 03/17/2017 3:00 PM Medical Record Number: [id number] Patient Account Number: [id number] Date of Birth/Sex: 02-10-1929 (83 y.o. Female) Treating RN: Huel Coventry Primary Care Laura Radilla: SYSTEM, PCP Other Clinician: Referring Tawanda Schall: [name] Cabal Treating Johnathen Testa/Extender: [name] [place] in Treatment: 3 Active Problems Location of Pain Severity and Description of Pain Patient Has Paino No Site Locations With Dressing Change: No Pain Management and Medication Current Pain Management: Goals for Pain Management Topical or injectable lidocaine is offered to patient for acute pain when surgical debridement is performed. If needed, Patient is instructed to use over the counter pain medication for the following 24-48 hours after debridement. Wound care MDs do not prescribed pain medications. Patient has chronic pain or uncontrolled pain. Patient has been instructed to make an appointment with their Primary Care Physician for pain management. Electronic Signature(s) Signed: 03/17/2017 5:03:50 PM By: Elliot Gurney, BSN, RN, CWS, Kim RN, BSN Entered By: Elliot Gurney, BSN, RN, CWS, Kim on 03/17/2017 15:01:51 [place], Rosalie ([id number]) -------------------------------------------------------------------------------- Wound Assessment Details Patient Name: [name] Date of Service: 03/17/2017 3:00 PM Medical Record Number: [id number] Patient Account Number: [id number] Date of Birth/Sex: 1929/02/19 (83 y.o. Female) Treating RN: Huel Coventry Primary Care Marie Chow: SYSTEM, PCP Other  Clinician: Referring Icyss Skog: [name] Cabal Treating Shardee Dieu/Extender: Maxwell Caul Weeks in Treatment: 3 Wound  Status Wound Number: 1 Primary Trauma, Other Etiology: Wound Location: Right Lower Leg - Anterior Wound Open Wounding Event: Trauma Status: Date Acquired: 01/28/2017 Comorbid Cataracts, Arrhythmia, Congestive Weeks Of Treatment: 3 History: Heart Failure, Hypertension, Clustered Wound: No Osteoarthritis Photos Wound Measurements Length: (cm) 2 Width: (cm) 2 Depth: (cm) 0.5 Area: (cm) 3.142 Volume: (cm) 1.571 % Reduction in Area: 68% % Reduction in Volume: 94.7% Epithelialization: None Undermining: Yes Starting Position (o'clock): 7 Ending Position (o'clock): 1 Maximum Distance: (cm) 1.8 Wound Description Full Thickness With Exposed Classification: Support Structures Wound Margin: Flat and Intact Exudate Large Amount: Exudate Type: Serosanguineous Exudate Color: red, brown Foul Odor After Cleansing: No Slough/Fibrino Yes Wound Bed Granulation Amount: Medium (34-66%) Exposed Structure Granulation Quality: Pale Fascia Exposed: No Yamada, Aylani ([id number]) Necrotic Amount: Medium (34-66%) Fat Layer (Subcutaneous Tissue) Exposed: Yes Necrotic Quality: Adherent Slough Tendon Exposed: No Muscle Exposed: Yes Necrosis of Muscle: Yes Joint Exposed: No Bone Exposed: No Periwound Skin Texture Texture Color No Abnormalities Noted: No No Abnormalities Noted: No Callus: No Atrophie Blanche: No Crepitus: No Cyanosis: No Excoriation: No Ecchymosis: No Induration: No Erythema: Yes Rash: No Erythema Location: Circumferential Scarring: No Hemosiderin Staining: No Mottled: Yes Moisture Pallor: No No Abnormalities Noted: No Rubor: No Dry / Scaly: No Maceration: No Temperature / Pain Temperature: No Abnormality Tenderness on Palpation: Yes Wound Preparation Ulcer Cleansing: Rinsed/Irrigated with Saline Topical Anesthetic Applied: Other:  lidocaine 4%, Treatment Notes Wound #1 (Right, Anterior Lower Leg) 1. Cleansed with: Clean wound with Normal Saline 2. Anesthetic Topical Lidocaine 4% cream to wound bed prior to debridement 4. Dressing Applied: Prisma Ag Notes SNAP VAC applied with one piece of blue foam Electronic Signature(s) Signed: 03/17/2017 5:03:50 PM By: Elliot Gurney, BSN, RN, CWS, Kim RN, BSN Entered By: Elliot Gurney, BSN, RN, CWS, Kim on 03/17/2017 15:07:17 Moffat, Amirrah ([id number]) -------------------------------------------------------------------------------- Vitals Details Patient Name: [name] Date of Service: 03/17/2017 3:00 PM Medical Record Number: [id number] Patient Account Number: [id number] Date of Birth/Sex: 1929-08-01 (83 y.o. Female) Treating RN: Huel Coventry Primary Care Nikkole Placzek: SYSTEM, PCP Other Clinician: Referring Amandy Chubbuck: [name] Cabal Treating Landynn Dupler/Extender: [name] Bogart in Treatment: 3 Vital Signs Time Taken: 15:01 Temperature (F): 97.8 Height (in): 64 Pulse (bpm): 78 Weight (lbs): 138 Respiratory Rate (breaths/min): 16 Body Mass Index (BMI): 23.7 Blood Pressure (mmHg): 135/79 Reference Range: 80 - 120 mg / dl Electronic Signature(s) Signed: 03/17/2017 5:03:50 PM By: Elliot Gurney, BSN, RN, CWS, Kim RN, BSN Entered By: Elliot Gurney, BSN, RN, CWS, Kim on 03/17/2017 15:02:11

## 2017-03-23 ENCOUNTER — Encounter: Payer: Medicare Other | Admitting: Internal Medicine

## 2017-03-23 DIAGNOSIS — I87321 Chronic venous hypertension (idiopathic) with inflammation of right lower extremity: Secondary | ICD-10-CM | POA: Diagnosis not present

## 2017-03-24 ENCOUNTER — Other Ambulatory Visit
Admission: RE | Admit: 2017-03-24 | Discharge: 2017-03-24 | Disposition: A | Discharge status: Home / Self Care | Payer: Medicare Other | Source: Ambulatory Visit | Attending: Internal Medicine | Admitting: Internal Medicine

## 2017-03-24 DIAGNOSIS — L089 Local infection of the skin and subcutaneous tissue, unspecified: Secondary | ICD-10-CM | POA: Insufficient documentation

## 2017-03-24 NOTE — Progress Notes (Signed)
[name], [name] ([id number]) Visit Report for 03/23/2017 Arrival Information Details Patient Name: [name], [name] Date of Service: 03/23/2017 3:00 PM Medical Record Number: [id number] Patient Account Number: [id number] Date of Birth/Sex: [birth year] (83 y.o. Female) Treating RN: Huel Coventry Primary Care Raoul Ciano: SYSTEM, PCP Other Clinician: Referring Meldrick Buttery: Altamese Cabal Treating Kacen Mellinger/Extender: Altamese [place] in Treatment: 4 Visit Information History Since Last Visit Added or deleted any medications: No Patient Arrived: Ambulatory Any new allergies or adverse reactions: No Arrival Time: 15:04 Had a fall or experienced change in No Accompanied By: grandaughtert activities of daily living that may affect Transfer Assistance: None risk of falls: Patient Identification Verified: Yes Signs or symptoms of abuse/neglect since last No Secondary Verification Process Yes visito Completed: Hospitalized since last visit: No Patient Requires Transmission- No Has Dressing in Place as Prescribed: Yes Based Precautions: Pain Present Now: No Patient Has Alerts: No Electronic Signature(s) Signed: 03/23/2017 4:42:12 PM By: Elliot Gurney, BSN, RN, CWS, Kim RN, BSN Entered By: Elliot Gurney, BSN, RN, CWS, Kim on 03/23/2017 15:06:22 [name] ([id number]) -------------------------------------------------------------------------------- Encounter Discharge Information Details Patient Name: [name] Date of Service: 03/23/2017 3:00 PM Medical Record Number: [id number] Patient Account Number: [id number] Date of Birth/Sex: 22-Oct-1928 (83 y.o. Female) Treating RN: Huel Coventry Primary Care Obryan Radu: SYSTEM, PCP Other Clinician: Referring Madasyn Heath: Altamese Cabal Treating Juliani Laduke/Extender: Altamese [place] in Treatment: 4 Encounter Discharge Information Items Schedule Follow-up Appointment: No Medication Reconciliation completed No and provided to Patient/Care Kajsa Butrum: Provided on Clinical  Summary of Care: 03/23/2017 Form Type Recipient Paper Patient RB Electronic Signature(s) Signed: 03/23/2017 3:43:39 PM By: Gwenlyn Perking Entered By: Gwenlyn Perking on 03/23/2017 15:43:39 [name], [name] ([id number]) -------------------------------------------------------------------------------- Lower Extremity Assessment Details Patient Name: [name] Date of Service: 03/23/2017 3:00 PM Medical Record Number: [id number] Patient Account Number: [id number] Date of Birth/Sex: 1928/11/26 (83 y.o. Female) Treating RN: Huel Coventry Primary Care Lamonta Cypress: SYSTEM, PCP Other Clinician: Referring Kamar Callender: Altamese Cabal Treating Orpheus Hayhurst/Extender: Altamese [place] in Treatment: 4 Edema Assessment Assessed: [Left: No] [Right: No] E[Left: dema] [Right: :] Calf Left: Right: Point of Measurement: 28 cm From Medial Instep cm 35.6 cm Ankle Left: Right: Point of Measurement: 8 cm From Medial Instep cm 22.5 cm Vascular Assessment Pulses: Dorsalis Pedis Palpable: [Right:Yes] Posterior Tibial Extremity colors, hair growth, and conditions: Extremity Color: [Right:Normal] Hair Growth on Extremity: [Right:Yes] Temperature of Extremity: [Right:Warm] Capillary Refill: [Right:< 3 seconds] Toe Nail Assessment Left: Right: Thick: No Discolored: No Deformed: No Improper Length and Hygiene: No Electronic Signature(s) Signed: 03/23/2017 4:42:12 PM By: Elliot Gurney, BSN, RN, CWS, Kim RN, BSN Entered By: Elliot Gurney, BSN, RN, CWS, Kim on 03/23/2017 15:18:28 [name], [name] ([id number]) -------------------------------------------------------------------------------- Multi Wound Chart Details Patient Name: [name] Date of Service: 03/23/2017 3:00 PM Medical Record Number: [id number] Patient Account Number: [id number] Date of Birth/Sex: 19-Feb-1929 (83 y.o. Female) Treating RN: Huel Coventry Primary Care Chanler Schreiter: SYSTEM, PCP Other Clinician: Referring Courtenay Hirth: Altamese Cabal Treating Courtland Reas/Extender: Altamese [place] in Treatment: 4 Vital Signs Height(in): 64 Pulse(bpm): 76 Weight(lbs): 138 Blood Pressure 158/62 (mmHg): Body Mass Index(BMI): 24 Temperature(F): 98.1 Respiratory Rate 16 (breaths/min): Photos: [N/A:N/A] Wound Location: Right Lower Leg - Anterior N/A N/A Wounding Event: Trauma N/A N/A Primary Etiology: Trauma, Other N/A N/A Comorbid History: Cataracts, Arrhythmia, N/A N/A Congestive Heart Failure, Hypertension, Osteoarthritis Date Acquired: 01/28/2017 N/A N/A Weeks of Treatment: 4 N/A N/A Wound Status: Open N/A N/A Measurements L x W x D 1.8x1.8x1 N/A N/A (cm) Area (cm) : 2.545 N/A N/A Volume (cm) : 2.545 N/A N/A %  Reduction in Area: 74.10% N/A N/A % Reduction in Volume: 91.40% N/A N/A Position 1 (o'clock): 2 Maximum Distance 1 4 (cm): Starting Position 1 7 (o'clock): Ending Position 1 12 (o'clock): Maximum Distance 1 1.4 (cm): [name], [name] ([id number]) Tunneling: Yes N/A N/A Undermining: Yes N/A N/A Classification: Full Thickness With N/A N/A Exposed Support Structures Exudate Amount: Large N/A N/A Exudate Type: Serous N/A N/A Exudate Color: amber N/A N/A Wound Margin: Epibole N/A N/A Granulation Amount: Medium (34-66%) N/A N/A Granulation Quality: Pale N/A N/A Necrotic Amount: Medium (34-66%) N/A N/A Exposed Structures: Fat Layer (Subcutaneous N/A N/A Tissue) Exposed: Yes Muscle: Yes Fascia: No Tendon: No Joint: No Bone: No Epithelialization: None N/A N/A Periwound Skin Texture: Excoriation: No N/A N/A Induration: No Callus: No Crepitus: No Rash: No Scarring: No Periwound Skin Maceration: No N/A N/A Moisture: Dry/Scaly: No Periwound Skin Color: Erythema: Yes N/A N/A Mottled: Yes Atrophie Blanche: No Cyanosis: No Ecchymosis: No Hemosiderin Staining: No Pallor: No Rubor: No Erythema Location: Circumferential N/A N/A Temperature: No Abnormality N/A N/A Tenderness on Yes N/A N/A Palpation: Wound Preparation: Ulcer  Cleansing: N/A N/A Rinsed/Irrigated with Saline Topical Anesthetic Applied: Other: lidocaine 4% Treatment Notes [name], [name] ([id number]) Electronic Signature(s) Signed: 03/23/2017 4:49:17 PM By: Baltazar Najjar MD Entered By: Baltazar Najjar on 03/23/2017 16:34:56 [name], [name] ([id number]) -------------------------------------------------------------------------------- Multi-Disciplinary Care Plan Details Patient Name: [name] Date of Service: 03/23/2017 3:00 PM Medical Record Number: [id number] Patient Account Number: [id number] Date of Birth/Sex: Sep 14, 1929 (83 y.o. Female) Treating RN: Huel Coventry Primary Care Miray Mancino: SYSTEM, PCP Other Clinician: Referring Jerzee Jerome: Altamese Cabal Treating Lilybelle Mayeda/Extender: Altamese [place] in Treatment: 4 Active Inactive ` Abuse / Safety / Falls / Self Care Management Nursing Diagnoses: History of Falls Impaired home maintenance Impaired physical mobility Potential for falls Potential for injury related to transfers Goals: Patient will not develop complications from immobility Date Initiated: 02/23/2017 Target Resolution Date: 07/26/2017 Goal Status: Active Patient will not experience any injury related to falls Date Initiated: 02/23/2017 Target Resolution Date: 07/26/2017 Goal Status: Active Patient will remain injury free related to falls Date Initiated: 02/23/2017 Target Resolution Date: 07/26/2017 Goal Status: Active Patient/caregiver will demonstrate safe use of adaptive devices to increase mobility Date Initiated: 02/23/2017 Target Resolution Date: 07/26/2017 Goal Status: Active Patient/caregiver will identify factors that restrict self-care and home management Date Initiated: 02/23/2017 Target Resolution Date: 07/26/2017 Goal Status: Active Patient/caregiver will verbalize understanding of skin care regimen Date Initiated: 02/23/2017 Target Resolution Date: 07/26/2017 Goal Status: Active Patient/caregiver will  verbalize understanding of the importance to maintain current immunizations/vaccinations Date Initiated: 02/23/2017 Target Resolution Date: 07/26/2017 Goal Status: Active Patient/caregiver will verbalize/demonstrate measure taken to improve self care Date Initiated: 02/23/2017 Target Resolution Date: 07/26/2017 [name], [name] ([id number]) Goal Status: Active Patient/caregiver will verbalize/demonstrate measures taken to improve the patient's personal safety Date Initiated: 02/23/2017 Target Resolution Date: 07/26/2017 Goal Status: Active Patient/caregiver will verbalize/demonstrate measures taken to prevent injury and/or falls Date Initiated: 02/23/2017 Target Resolution Date: 07/26/2017 Goal Status: Active Patient/caregiver will verbalize/demonstrate understanding of what to do in case of emergency Date Initiated: 02/23/2017 Target Resolution Date: 07/26/2017 Goal Status: Active Interventions: Call light and/or bell within patient's reach Assess Activities of Daily Living upon admission and as needed Assess fall risk on admission and as needed Assess: immobility, friction, shearing, incontinence upon admission and as needed Assess impairment of mobility on admission and as needed per policy Assess personal safety and home safety (as indicated) on admission and as needed Assess self care needs on admission and as needed  Provide education on basic hygiene Provide education on fall prevention Provide education on personal and home safety Provide education on safe transfers Treatment Activities: Education provided on Basic Hygiene : 02/23/2017 Notes: ` Orientation to the Wound Care Program Nursing Diagnoses: Knowledge deficit related to the wound healing center program Goals: Patient/caregiver will verbalize understanding of the Wound Healing Center Program Date Initiated: 02/23/2017 Target Resolution Date: 07/26/2017 Goal Status: Active Interventions: Provide education on  orientation to the wound center Notes: [name]` [name], [name] ([id number]) Wound/Skin Impairment Nursing Diagnoses: Impaired tissue integrity Knowledge deficit related to ulceration/compromised skin integrity Goals: Patient/caregiver will verbalize understanding of skin care regimen Date Initiated: 02/23/2017 Target Resolution Date: 07/26/2017 Goal Status: Active Ulcer/skin breakdown will have a volume reduction of 30% by week 4 Date Initiated: 02/23/2017 Target Resolution Date: 07/26/2017 Goal Status: Active Ulcer/skin breakdown will have a volume reduction of 50% by week 8 Date Initiated: 02/23/2017 Target Resolution Date: 07/26/2017 Goal Status: Active Ulcer/skin breakdown will have a volume reduction of 80% by week 12 Date Initiated: 02/23/2017 Target Resolution Date: 07/26/2017 Goal Status: Active Ulcer/skin breakdown will heal within 14 weeks Date Initiated: 02/23/2017 Target Resolution Date: 07/26/2017 Goal Status: Active Interventions: Assess patient/caregiver ability to obtain necessary supplies Assess patient/caregiver ability to perform ulcer/skin care regimen upon admission and as needed Assess ulceration(s) every visit Provide education on ulcer and skin care Treatment Activities: Referred to DME Khayman Kirsch for dressing supplies : 02/23/2017 Skin care regimen initiated : 02/23/2017 Topical wound management initiated : 02/23/2017 Notes: Electronic Signature(s) Signed: 03/23/2017 4:42:12 PM By: Elliot GurneyWoody, BSN, RN, CWS, Kim RN, BSN Entered By: Elliot GurneyWoody, BSN, RN, CWS, Kim on 03/23/2017 15:18:33 [name], [name] ([id number]) -------------------------------------------------------------------------------- Pain Assessment Details Patient Name: [name], [name] Date of Service: 03/23/2017 3:00 PM Medical Record Number: [id number] Patient Account Number: [id number] Date of Birth/Sex: 12/24/1928 (83 y.o. Female) Treating RN: Huel CoventryWoody, Kim Primary Care Bion Todorov: SYSTEM, PCP Other Clinician: Referring  Illyanna Petillo: Altamese CabalJONES, MAURICE Treating Allye Hoyos/Extender: Altamese CarolinaOBSON, MICHAEL G Weeks in Treatment: 4 Active Problems Location of Pain Severity and Description of Pain Patient Has Paino No Site Locations With Dressing Change: No Pain Management and Medication Current Pain Management: Goals for Pain Management Topical or injectable lidocaine is offered to patient for acute pain when surgical debridement is performed. If needed, Patient is instructed to use over the counter pain medication for the following 24-48 hours after debridement. Wound care MDs do not prescribed pain medications. Patient has chronic pain or uncontrolled pain. Patient has been instructed to make an appointment with their Primary Care Physician for pain management. Notes Patient states there is tingling, but no pain. Electronic Signature(s) Signed: 03/23/2017 4:42:12 PM By: Elliot GurneyWoody, BSN, RN, CWS, Kim RN, BSN Entered By: Elliot GurneyWoody, BSN, RN, CWS, Kim on 03/23/2017 15:06:53 [name], [name] ([id number]) -------------------------------------------------------------------------------- Wound Assessment Details Patient Name: [name], Beverlee Date of Service: 03/23/2017 3:00 PM Medical Record Number: [id number] Patient Account Number: [id number] Date of Birth/Sex: 08/08/1929 (83 y.o. Female) Treating RN: Huel CoventryWoody, Kim Primary Care Christyanna Mckeon: SYSTEM, PCP Other Clinician: Referring Ayjah Show: Altamese CabalJONES, MAURICE Treating Lucah Petta/Extender: Maxwell CaulOBSON, MICHAEL G Weeks in Treatment: 4 Wound Status Wound Number: 1 Primary Trauma, Other Etiology: Wound Location: Right Lower Leg - Anterior Wound Open Wounding Event: Trauma Status: Date Acquired: 01/28/2017 Comorbid Cataracts, Arrhythmia, Congestive Weeks Of Treatment: 4 History: Heart Failure, Hypertension, Clustered Wound: No Osteoarthritis Photos Wound Measurements Length: (cm) 1.8 Width: (cm) 1.8 Depth: (cm) 1 Area: (cm) 2.545 Volume: (cm) 2.545 % Reduction in Area: 74.1% % Reduction in  Volume: 91.4% Epithelialization: None Tunneling: Yes Position (  o'clock): 2 Maximum Distance: (cm) 4 Undermining: Yes Starting Position (o'clock): 7 Ending Position (o'clock): 12 Maximum Distance: (cm) 1.4 Wound Description Full Thickness With Exposed Classification: Support Structures Wound Margin: Epibole Exudate Large Amount: Exudate Type: Serous Exudate Color: amber Heiland, Nina ([id number]) Foul Odor After Cleansing: No Slough/Fibrino Yes Wound Bed Granulation Amount: Medium (34-66%) Exposed Structure Granulation Quality: Pale Fascia Exposed: No Necrotic Amount: Medium (34-66%) Fat Layer (Subcutaneous Tissue) Exposed: Yes Necrotic Quality: Adherent Slough Tendon Exposed: No Muscle Exposed: Yes Necrosis of Muscle: Yes Joint Exposed: No Bone Exposed: No Periwound Skin Texture Texture Color No Abnormalities Noted: No No Abnormalities Noted: No Callus: No Atrophie Blanche: No Crepitus: No Cyanosis: No Excoriation: No Ecchymosis: No Induration: No Erythema: Yes Rash: No Erythema Location: Circumferential Scarring: No Hemosiderin Staining: No Mottled: Yes Moisture Pallor: No No Abnormalities Noted: No Rubor: No Dry / Scaly: No Maceration: No Temperature / Pain Temperature: No Abnormality Tenderness on Palpation: Yes Wound Preparation Ulcer Cleansing: Rinsed/Irrigated with Saline Topical Anesthetic Applied: Other: lidocaine 4%, Electronic Signature(s) Signed: 03/23/2017 4:42:12 PM By: Elliot Gurney, BSN, RN, CWS, Kim RN, BSN Entered By: Elliot Gurney, BSN, RN, CWS, Kim on 03/23/2017 15:15:54 [place], Rami ([id number]) -------------------------------------------------------------------------------- Vitals Details Patient Name: [name] Date of Service: 03/23/2017 3:00 PM Medical Record Number: [id number] Patient Account Number: [id number] Date of Birth/Sex: [birth year] (83 y.o. Female) Treating RN: Huel Coventry Primary Care Bernie Fobes: SYSTEM, PCP Other  Clinician: Referring Karthikeya Funke: Altamese Cabal Treating Marlon Vonruden/Extender: Altamese [place] in Treatment: 4 Vital Signs Time Taken: 15:06 Temperature (F): 98.1 Height (in): 64 Pulse (bpm): 76 Weight (lbs): 138 Respiratory Rate (breaths/min): 16 Body Mass Index (BMI): 23.7 Blood Pressure (mmHg): 158/62 Reference Range: 80 - 120 mg / dl Electronic Signature(s) Signed: 03/23/2017 4:42:12 PM By: Elliot Gurney, BSN, RN, CWS, Kim RN, BSN Entered By: Elliot Gurney, BSN, RN, CWS, Kim on 03/23/2017 15:09:06

## 2017-03-24 NOTE — Progress Notes (Signed)
[name], [name] ([id number]) Visit Report for 03/23/2017 Chief Complaint Document Details Patient Name: [name], [name] Date of Service: 03/23/2017 3:00 PM Medical Record Patient Account Number: [id number] Number: Treating RN: Huel Coventry June 13, 1929 (83 y.o. Other Clinician: Date of Birth/Sex: Female) Treating Mabrey Howland Primary Care Provider: SYSTEM, PCP Provider/Extender: G Referring Provider: Myrtie Neither in Treatment: 4 Information Obtained from: Patient Chief Complaint 02/23/17; patient is here for review of wound on the right anterior lower leg Electronic Signature(s) Signed: 03/23/2017 4:49:17 PM By: [name] Najjar MD Entered By: [name] Najjar on 03/23/2017 16:35:05 Hoke, Saleah ([id number]) -------------------------------------------------------------------------------- HPI Details Patient Name: [name] Date of Service: 03/23/2017 3:00 PM Medical Record Patient Account Number: [id number] Number: Treating RN: Huel Coventry 29-Jul-1929 (83 y.o. Other Clinician: Date of Birth/Sex: Female) Treating Kelleen Stolze Primary Care Provider: SYSTEM, PCP Provider/Extender: G Referring Provider: Myrtie Neither in Treatment: 4 History of Present Illness HPI Description: 02/23/17 this is an 83 year old woman who is at a fitness club about to sit down on a rowing machine. She fell and traumatized the anterior part of her right leg on a sharp metal surface. This caused immediate pain and bleeding. She stopped off on her way home at an urgent care ultimately was referred to the ER and by that time she had a significant hematoma of her right leg. She was followed in the urgent orthopedic clinic. She had a duplex ultrasound of the right leg on 5/10 that was negative for DVT x-ray of the right leg on 01/28/17 was negative including a CT scan of the tib-fib. The CT scan did show the subcutaneous hematoma which on 01/28/17 measured 7.7 cm craniocaudal by 2.5 cm x 5.6  cm AP subcutaneous air was also identified felt likely secondary to small lacerations. There was no acute fracture. The patient has been continuing mostly with topical antibiotics on this wound. She did not have any compression. She is referred here from the urgent orthopedic clinic for review of this. She did not have a wound history she is not a diabetic. ABI in this clinic on the right was 1.1 on the left 1.0. She is on aspirin as her only anti-coagulant 03/02/17; the patient arrives for review of a traumatic wound on her right anterior leg with subsequent hematoma formation and considerable tissue loss on the right anterior lateral leg. We'll use silver alginate last week. We kept her under compression and the patient states that her pain was actually a lot better. Wound measures at 5 x 2.5 x 2. Consider home amount of undermining laterally by roughly 3.5 cm from 7 to 12:00 03/09/17; wound actually looks better however there is considerable undermining from roughly 7 to 12:00. No evidence of surrounding infection. A snap VAC was applied today 03/17/17; the patient continues to do quite well. There have been some logistic problems with the wound VAC however we have managed to keep this in place. She did fill out one of the canisters but didn't come in in follow-up. We've been using silver collagen under a snap VAC 03/23/17; on intake today the patient appeared to have a new tunnel superiorly in the wound by 4 cm. This is separated from the undermining from roughly 7 or 11:00 she has had chronically. This undermines at 1.8 cm. We've been using silver collagen under a snapVAC Electronic Signature(s) Signed: 03/23/2017 4:49:17 PM By: [name] Najjar MD Entered By: [name] Najjar on 03/23/2017 16:35:48 Melland, [name] ([id number]) -------------------------------------------------------------------------------- Physical Exam Details Patient Name: [name] Date of  Service: 03/23/2017 3:00  PM Medical Record Patient Account Number: [id number] Number: Treating RN: Huel CoventryWoody, [name] 04/07/1929 (83 y.o. Other Clinician: Date of Birth/Sex: Female) Treating Cirilo Canner Primary Care Provider: SYSTEM, PCP Provider/Extender: G Referring Provider: Myrtie NeitherJONES, MAURICE Weeks in Treatment: 4 Constitutional Patient is hypertensive.. Pulse regular and within target range for patient.Marland Kitchen. Respirations regular, non-labored and within target range.. Temperature is normal and within the target range for the patient.Marland Kitchen. appears in no distress. Eyes Conjunctivae clear. No discharge. Cardiovascular Pedal pulses palpable and strong on the right. Lymphatic Nonpalpable the right popliteal or inguinal area. Integumentary (Hair, Skin) No erythema on the right leg. Psychiatric No evidence of depression, anxiety, or agitation. Calm, cooperative, and communicative. Appropriate interactions and affect.. Notes Wound exam; the patient has undermining from 7-12 and a new tunnel at roughly 1:30. This goes 4 cm. I suspect this was present previously but only recently unroofed. I did a culture of this area. There is no surrounding soft tissue infection and no tenderness Electronic Signature(s) Signed: 03/23/2017 4:49:17 PM By: [name], [name] Frampton MD Entered By: [name], Kutter Schnepf on 03/23/2017 16:37:25 [place], [name] ([id number]) -------------------------------------------------------------------------------- Physician Orders Details Patient Name: [name] CollumBERMAN, [name] Date of Service: 03/23/2017 3:00 PM Medical Record Patient Account Number: [id number] Number: Treating RN: Huel CoventryWoody, [name] 03/22/1929 (83 y.o. Other Clinician: Date of Birth/Sex: Female) Treating Yaniris Braddock Primary Care Provider: SYSTEM, PCP Provider/Extender: G Referring Provider: Myrtie NeitherJONES, MAURICE Weeks in Treatment: 4 Verbal / Phone Orders: No Diagnosis Coding Wound Cleansing Wound #1 Right,Anterior Lower Leg o Cleanse wound with mild  soap and water o May shower with protection. - MAY USE CAST PROTECTOR OR A BIG TRASH BAG OVER THE LEG AND SEAL IT OFF WITH A TAPE . DO NOT GET IT WET o No tub bath. Anesthetic Wound #1 Right,Anterior Lower Leg o Topical Lidocaine 4% cream applied to wound bed prior to debridement Skin Barriers/Peri-Wound Care Wound #1 Right,Anterior Lower Leg o Skin Prep Primary Wound Dressing Wound #1 Right,Anterior Lower Leg o Prisma Ag - to base of wound and slightly under the undermining Dressing Change Frequency Wound #1 Right,Anterior Lower Leg o Other: - as needed for drainage Follow-up Appointments Wound #1 Right,Anterior Lower Leg o Return Appointment in 1 week. Additional Orders / Instructions Wound #1 Right,Anterior Lower Leg o Increase protein intake. o Activity as tolerated [name], [name] ([id number]) Negative Pressure Wound Therapy Wound #1 Right,Anterior Lower Leg o Other: - SNAP VAC Electronic Signature(s) Signed: 03/23/2017 4:42:12 PM By: [name], BSN, RN, CWS, Kim RN, [name] Signed: 03/23/2017 4:49:17 PM By: [name], [name] Leaks MD Entered By: [name], BSN, RN, CWS, [name] on 03/23/2017 15:43:10 RamonaBERMAN, [name] ([id number]) -------------------------------------------------------------------------------- Problem List Details Patient Name: [name] CollumBERMAN, Ashelyn Date of Service: 03/23/2017 3:00 PM Medical Record Patient Account Number: [id number] Number: Treating RN: Huel CoventryWoody, [name] 08/09/1929 (83 y.o. Other Clinician: Date of Birth/Sex: Female) Treating Zavion Sleight Primary Care Provider: SYSTEM, PCP Provider/Extender: G Referring Provider: Myrtie NeitherJONES, MAURICE Weeks in Treatment: 4 Active Problems ICD-10 Encounter Code Description Active Date Diagnosis S81.811D Laceration without foreign body, right lower leg, 02/23/2017 Yes subsequent encounter I87.321 Chronic venous hypertension (idiopathic) with 02/23/2017 Yes inflammation of right lower extremity Inactive  Problems Resolved Problems Electronic Signature(s) Signed: 03/23/2017 4:49:17 PM By: [name], Janos Shampine MD Entered By: [name], Maximilliano Kersh on 03/23/2017 16:34:48 Johanson, Keimora ([id number]) -------------------------------------------------------------------------------- Progress Note Details Patient Name: [name] CollumBERMAN, [name] Date of Service: 03/23/2017 3:00 PM Medical Record Patient Account Number: [id number] Number: Treating RN: Huel CoventryWoody, [name] 04/08/1929 (83 y.o. Other Clinician: Date of Birth/Sex: Female) Treating Ali Mclaurin  Primary Care Provider: SYSTEM, PCP Provider/Extender: G Referring Provider: Myrtie Neither in Treatment: 4 Subjective Chief Complaint Information obtained from Patient 02/23/17; patient is here for review of wound on the right anterior lower leg History of Present Illness (HPI) 02/23/17 this is an 83 year old woman who is at a fitness club about to sit down on a rowing machine. She fell and traumatized the anterior part of her right leg on a sharp metal surface. This caused immediate pain and bleeding. She stopped off on her way home at an urgent care ultimately was referred to the ER and by that time she had a significant hematoma of her right leg. She was followed in the urgent orthopedic clinic. She had a duplex ultrasound of the right leg on 5/10 that was negative for DVT x-ray of the right leg on 01/28/17 was negative including a CT scan of the tib-fib. The CT scan did show the subcutaneous hematoma which on 01/28/17 measured 7.7 cm craniocaudal by 2.5 cm x 5.6 cm AP subcutaneous air was also identified felt likely secondary to small lacerations. There was no acute fracture. The patient has been continuing mostly with topical antibiotics on this wound. She did not have any compression. She is referred here from the urgent orthopedic clinic for review of this. She did not have a wound history she is not a diabetic. ABI in this clinic on the right was 1.1 on the  left 1.0. She is on aspirin as her only anti-coagulant 03/02/17; the patient arrives for review of a traumatic wound on her right anterior leg with subsequent hematoma formation and considerable tissue loss on the right anterior lateral leg. We'll use silver alginate last week. We kept her under compression and the patient states that her pain was actually a lot better. Wound measures at 5 x 2.5 x 2. Consider home amount of undermining laterally by roughly 3.5 cm from 7 to 12:00 03/09/17; wound actually looks better however there is considerable undermining from roughly 7 to 12:00. No evidence of surrounding infection. A snap VAC was applied today 03/17/17; the patient continues to do quite well. There have been some logistic problems with the wound VAC however we have managed to keep this in place. She did fill out one of the canisters but didn't come in in follow-up. We've been using silver collagen under a snap VAC 03/23/17; on intake today the patient appeared to have a new tunnel superiorly in the wound by 4 cm. This is separated from the undermining from roughly 7 or 11:00 she has had chronically. This undermines at 1.8 cm. We've been using silver collagen under a snapVAC [name], [name] ([id number]) Objective Constitutional Patient is hypertensive.. Pulse regular and within target range for patient.Marland Kitchen Respirations regular, non-labored and within target range.. Temperature is normal and within the target range for the patient.Marland Kitchen appears in no distress. Vitals Time Taken: 3:06 PM, Height: 64 in, Weight: 138 lbs, BMI: 23.7, Temperature: 98.1 F, Pulse: 76 bpm, Respiratory Rate: 16 breaths/min, Blood Pressure: 158/62 mmHg. Eyes Conjunctivae clear. No discharge. Cardiovascular Pedal pulses palpable and strong on the right. Lymphatic Nonpalpable the right popliteal or inguinal area. Psychiatric No evidence of depression, anxiety, or agitation. Calm, cooperative, and communicative.  Appropriate interactions and affect.. General Notes: Wound exam; the patient has undermining from 7-12 and a new tunnel at roughly 1:30. This goes 4 cm. I suspect this was present previously but only recently unroofed. I did a culture of this area. There is no surrounding soft  tissue infection and no tenderness Integumentary (Hair, Skin) No erythema on the right leg. Wound #1 status is Open. Original cause of wound was Trauma. The wound is located on the Right,Anterior Lower Leg. The wound measures 1.8cm length x 1.8cm width x 1cm depth; 2.545cm^2 area and 2.545cm^3 volume. There is muscle and Fat Layer (Subcutaneous Tissue) Exposed exposed. Tunneling has been noted at 2:00 with a maximum distance of 4cm. Undermining begins at 7:00 and ends at 12:00 with a maximum distance of 1.4cm. There is a large amount of serous drainage noted. The wound margin is epibole. There is medium (34-66%) pale granulation within the wound bed. There is a medium (34-66%) amount of necrotic tissue within the wound bed including Adherent Slough and Necrosis of Muscle. The periwound skin appearance exhibited: Mottled, Erythema. The periwound skin appearance did not exhibit: Callus, Crepitus, Excoriation, Induration, Rash, Scarring, Dry/Scaly, Maceration, Atrophie Blanche, Cyanosis, Ecchymosis, Hemosiderin Staining, Pallor, Rubor. The surrounding wound skin color is noted with erythema which is circumferential. Periwound temperature was noted as No Abnormality. The periwound has tenderness on palpation. [facility], Brittiney ([id number]) Assessment Active Problems ICD-10 S81.811D - Laceration without foreign body, right lower leg, subsequent encounter I87.321 - Chronic venous hypertension (idiopathic) with inflammation of right lower extremity Plan Wound Cleansing: Wound #1 Right,Anterior Lower Leg: Cleanse wound with mild soap and water May shower with protection. - MAY USE CAST PROTECTOR OR A BIG TRASH BAG OVER THE  LEG AND SEAL IT OFF WITH A TAPE . DO NOT GET IT WET No tub bath. Anesthetic: Wound #1 Right,Anterior Lower Leg: Topical Lidocaine 4% cream applied to wound bed prior to debridement Skin Barriers/Peri-Wound Care: Wound #1 Right,Anterior Lower Leg: Skin Prep Primary Wound Dressing: Wound #1 Right,Anterior Lower Leg: Prisma Ag - to base of wound and slightly under the undermining Dressing Change Frequency: Wound #1 Right,Anterior Lower Leg: Other: - as needed for drainage Follow-up Appointments: Wound #1 Right,Anterior Lower Leg: Return Appointment in 1 week. Additional Orders / Instructions: Wound #1 Right,Anterior Lower Leg: Increase protein intake. Activity as tolerated Negative Pressure Wound Therapy: Wound #1 Right,Anterior Lower Leg: Other: - SNAP VAC Garza, Annalese ([id number]) #1 I see no need to change the current dressing which is silver collagen. We'll make an effort a pack up and the newly identified, we'll. Continue under Snap VAc #2 I did a blind swab culture of the topical. No empiric antibiotics were given Electronic Signature(s) Signed: 03/23/2017 4:49:17 PM By: [name] Najjar MD Entered By: [name] Najjar on 03/23/2017 16:39:41 Deland, Marlee ([id number]) -------------------------------------------------------------------------------- SuperBill Details Patient Name: [name] Date of Service: 03/23/2017 Medical Record Patient Account Number: [id number] Number: Treating RN: Huel Coventry 04-03-1929 (83 y.o. Other Clinician: Date of Birth/Sex: Female) Treating Naseer Hearn Primary Care Provider: SYSTEM, PCP Provider/Extender: G Referring Provider: Myrtie Neither in Treatment: 4 Diagnosis Coding ICD-10 Codes Code Description S81.811D Laceration without foreign body, right lower leg, subsequent encounter I87.321 Chronic venous hypertension (idiopathic) with inflammation of right lower extremity Facility Procedures CPT4 Code:  [id number] Description: 97607 NEG PRESS WND TX <=50 SQ CM Modifier: Quantity: 1 Physician Procedures CPT4 Code Description: [id number] - WC PHYS LEVEL 3 - EST PT ICD-10 Description Diagnosis S81.811D Laceration without foreign body, right lower leg, Modifier: subsequent enc Quantity: 1 ounter Electronic Signature(s) Signed: 03/23/2017 4:49:17 PM By: [name] Najjar MD Entered By: [name] Najjar on 03/23/2017 16:39:14

## 2017-03-26 ENCOUNTER — Encounter: Payer: Medicare Other | Admitting: Surgery

## 2017-03-26 DIAGNOSIS — I87321 Chronic venous hypertension (idiopathic) with inflammation of right lower extremity: Secondary | ICD-10-CM | POA: Diagnosis not present

## 2017-03-26 LAB — AEROBIC CULTURE  (SUPERFICIAL SPECIMEN)

## 2017-03-26 LAB — AEROBIC CULTURE W GRAM STAIN (SUPERFICIAL SPECIMEN): Culture: NORMAL

## 2017-03-28 NOTE — Progress Notes (Signed)
[name] ([id number]) Visit Report for 03/26/2017 Arrival Information Details Patient Name: [name], [name] Date of Service: 03/26/2017 1:00 PM Medical Record Number: [id number] Patient Account Number: [id number] Date of Birth/Sex: 04-08-1929 (83 y.o. Female) Treating RN: Huel Coventry Primary Care Loura Pitt: SYSTEM, PCP Other Clinician: Referring Lorry Furber: Altamese Cabal Treating Dodi Leu/Extender: Rudene Re in Treatment: 4 Visit Information History Since Last Visit Added or deleted any medications: No Patient Arrived: Ambulatory Any new allergies or adverse reactions: No Arrival Time: 13:19 Had a fall or experienced change in No Accompanied By: self activities of daily living that may affect Transfer Assistance: None risk of falls: Patient Identification Verified: Yes Signs or symptoms of abuse/neglect since last No Secondary Verification Process Yes visito Completed: Hospitalized since last visit: No Patient Requires Transmission-Based No Has Dressing in Place as Prescribed: Yes Precautions: Pain Present Now: Yes Patient Has Alerts: No Electronic Signature(s) Signed: 03/26/2017 4:37:17 PM By: Elliot Gurney, BSN, RN, CWS, Kim RN, BSN Entered By: Elliot Gurney, BSN, RN, CWS, Kim on 03/26/2017 13:19:31 Whelpley, Edith ([id number]) -------------------------------------------------------------------------------- Clinic Level of Care Assessment Details Patient Name: [name] Date of Service: 03/26/2017 1:00 PM Medical Record Number: [id number] Patient Account Number: [id number] Date of Birth/Sex: 02/07/1929 (83 y.o. Female) Treating RN: Huel Coventry Primary Care Akacia Boltz: SYSTEM, PCP Other Clinician: Referring Omega Durante: Altamese Cabal Treating Anfernee Peschke/Extender: Rudene Re in Treatment: 4 Clinic Level of Care Assessment Items TOOL 4 Quantity Score []  - Use when only an EandM is performed on FOLLOW-UP visit 0 ASSESSMENTS - Nursing Assessment / Reassessment []  - Reassessment of  Co-morbidities (includes updates in patient status) 0 X - Reassessment of Adherence to Treatment Plan 1 5 ASSESSMENTS - Wound and Skin Assessment / Reassessment []  - Simple Wound Assessment / Reassessment - one wound 0 X - Complex Wound Assessment / Reassessment - multiple wounds 1 5 []  - Dermatologic / Skin Assessment (not related to wound area) 0 ASSESSMENTS - Focused Assessment []  - Circumferential Edema Measurements - multi extremities 0 []  - Nutritional Assessment / Counseling / Intervention 0 []  - Lower Extremity Assessment (monofilament, tuning fork, pulses) 0 []  - Peripheral Arterial Disease Assessment (using hand held doppler) 0 ASSESSMENTS - Ostomy and/or Continence Assessment and Care []  - Incontinence Assessment and Management 0 []  - Ostomy Care Assessment and Management (repouching, etc.) 0 PROCESS - Coordination of Care X - Simple Patient / Family Education for ongoing care 1 15 []  - Complex (extensive) Patient / Family Education for ongoing care 0 []  - Staff obtains Chiropractor, Records, Test Results / Process Orders 0 []  - Staff telephones HHA, Nursing Homes / Clarify orders / etc 0 []  - Routine Transfer to another Facility (non-emergent condition) 0 Wolak, Dalila ([id number]) []  - Routine Hospital Admission (non-emergent condition) 0 []  - New Admissions / Manufacturing engineer / Ordering NPWT, Apligraf, etc. 0 []  - Emergency Hospital Admission (emergent condition) 0 X - Simple Discharge Coordination 1 10 []  - Complex (extensive) Discharge Coordination 0 PROCESS - Special Needs []  - Pediatric / Minor Patient Management 0 []  - Isolation Patient Management 0 []  - Hearing / Language / Visual special needs 0 []  - Assessment of Community assistance (transportation, D/C planning, etc.) 0 []  - Additional assistance / Altered mentation 0 []  - Support Surface(s) Assessment (bed, cushion, seat, etc.) 0 INTERVENTIONS - Wound Cleansing / Measurement X - Simple Wound Cleansing -  one wound 1 5 []  - Complex Wound Cleansing - multiple wounds 0 X - Wound Imaging (photographs - any number of wounds) 1 5 []  -  Wound Tracing (instead of photographs) 0 X - Simple Wound Measurement - one wound 1 5 []  - Complex Wound Measurement - multiple wounds 0 INTERVENTIONS - Wound Dressings []  - Small Wound Dressing one or multiple wounds 0 X - Medium Wound Dressing one or multiple wounds 1 15 []  - Large Wound Dressing one or multiple wounds 0 []  - Application of Medications - topical 0 []  - Application of Medications - injection 0 INTERVENTIONS - Miscellaneous []  - External ear exam 0 Cannady, Alley ([id number]) []  - Specimen Collection (cultures, biopsies, blood, body fluids, etc.) 0 []  - Specimen(s) / Culture(s) sent or taken to Lab for analysis 0 []  - Patient Transfer (multiple staff / Michiel SitesHoyer Lift / Similar devices) 0 []  - Simple Staple / Suture removal (25 or less) 0 []  - Complex Staple / Suture removal (26 or more) 0 []  - Hypo / Hyperglycemic Management (close monitor of Blood Glucose) 0 []  - Ankle / Brachial Index (ABI) - do not check if billed separately 0 X - Vital Signs 1 5 Has the patient been seen at the hospital within the last three years: Yes Total Score: 70 Level Of Care: New/Established - Level 2 Electronic Signature(s) Signed: 03/26/2017 4:37:17 PM By: Elliot GurneyWoody, BSN, RN, CWS, Kim RN, BSN Entered By: Elliot GurneyWoody, BSN, RN, CWS, Kim on 03/26/2017 13:45:38 [name], Ryah ([id number]) -------------------------------------------------------------------------------- Encounter Discharge Information Details Patient Name: [name] CollumBERMAN, [name] Date of Service: 03/26/2017 1:00 PM Medical Record Number: [id number] Patient Account Number: [id number] Date of Birth/Sex: 08/24/1929 (83 y.o. Female) Treating RN: Huel CoventryWoody, Kim Primary Care Ferol Laiche: SYSTEM, PCP Other Clinician: Referring Latorria Zeoli: Altamese CabalJONES, MAURICE Treating Rondrick Barreira/Extender: Rudene ReBritto, Errol Weeks in Treatment: 4 Encounter Discharge  Information Items Discharge Pain Level: 0 Discharge Condition: Stable Ambulatory Status: Ambulatory Discharge Destination: Home Transportation: Private Auto Accompanied By: self Schedule Follow-up Appointment: Yes Medication Reconciliation completed and provided to Patient/Care Yes Elvy Mclarty: Provided on Clinical Summary of Care: 03/26/2017 Form Type Recipient Paper Patient RB Electronic Signature(s) Signed: 03/26/2017 1:57:11 PM By: Gwenlyn PerkingMoore, Shelia Entered By: Gwenlyn PerkingMoore, Shelia on 03/26/2017 13:57:10 Fogel, Eveny ([id number]) -------------------------------------------------------------------------------- Lower Extremity Assessment Details Patient Name: [name] CollumBERMAN, Latana Date of Service: 03/26/2017 1:00 PM Medical Record Number: [id number] Patient Account Number: [id number] Date of Birth/Sex: 01/26/1929 (83 y.o. Female) Treating RN: Huel CoventryWoody, Kim Primary Care Rabecka Brendel: SYSTEM, PCP Other Clinician: Referring Felix Pratt: Altamese CabalJONES, MAURICE Treating Massimo Hartland/Extender: Rudene ReBritto, Errol Weeks in Treatment: 4 Edema Assessment Assessed: [Left: No] [Right: No] E[Left: dema] [Right: :] Calf Left: Right: Point of Measurement: 28 cm From Medial Instep cm 34.8 cm Ankle Left: Right: Point of Measurement: 8 cm From Medial Instep cm 22 cm Vascular Assessment Pulses: Dorsalis Pedis Palpable: [Right:Yes] Posterior Tibial Palpable: [Right:Yes] Extremity colors, hair growth, and conditions: Extremity Color: [Right:Red] Hair Growth on Extremity: [Right:Yes] Temperature of Extremity: [Right:Warm] Capillary Refill: [Right:< 3 seconds] Dependent Rubor: [Right:No] Blanched when Elevated: [Right:No] Lipodermatosclerosis: [Right:No] Toe Nail Assessment Left: Right: Thick: No Discolored: No Deformed: No Electronic Signature(s) Signed: 03/26/2017 4:37:17 PM By: Elliot GurneyWoody, BSN, RN, CWS, Kim RN, BSN GleneagleBERMAN, Kaija ([id number]) Entered By: Elliot GurneyWoody, BSN, RN, CWS, Kim on 03/26/2017 13:28:09 RosstonBERMAN, Keslee  ([id number]) -------------------------------------------------------------------------------- Multi Wound Chart Details Patient Name: [name] CollumBERMAN, Ron Date of Service: 03/26/2017 1:00 PM Medical Record Number: [id number] Patient Account Number: [id number] Date of Birth/Sex: 07/19/1929 (83 y.o. Female) Treating RN: Huel CoventryWoody, Kim Primary Care Carlota Philley: SYSTEM, PCP Other Clinician: Referring Adreona Brand: Altamese CabalJONES, MAURICE Treating Ariq Khamis/Extender: Rudene ReBritto, Errol Weeks in Treatment: 4 Vital Signs Height(in): 64 Pulse(bpm): 80 Weight(lbs): 138 Blood Pressure 148/62 (mmHg): Body Mass Index(BMI): 24 Temperature(F):  97.8 Respiratory Rate 16 (breaths/min): Photos: [N/A:N/A] Wound Location: Right Lower Leg - Anterior N/A N/A Wounding Event: Trauma N/A N/A Primary Etiology: Trauma, Other N/A N/A Comorbid History: Cataracts, Arrhythmia, N/A N/A Congestive Heart Failure, Hypertension, Osteoarthritis Date Acquired: 01/28/2017 N/A N/A Weeks of Treatment: 4 N/A N/A Wound Status: Open N/A N/A Measurements L x W x D 2x2x0.9 N/A N/A (cm) Area (cm) : 3.142 N/A N/A Volume (cm) : 2.827 N/A N/A % Reduction in Area: 68.00% N/A N/A % Reduction in Volume: 90.40% N/A N/A Position 1 (o'clock): 1 Maximum Distance 1 3.2 (cm): Starting Position 1 7 (o'clock): Ending Position 1 12 (o'clock): Maximum Distance 1 4 (cm): Gahan, Maurina ([id number]) Tunneling: Yes N/A N/A Undermining: Yes N/A N/A Classification: Full Thickness With N/A N/A Exposed Support Structures Exudate Amount: Large N/A N/A Exudate Type: Serous N/A N/A Exudate Color: amber N/A N/A Wound Margin: Epibole N/A N/A Granulation Amount: Medium (34-66%) N/A N/A Granulation Quality: Pink, Pale, Hyper- N/A N/A granulation Necrotic Amount: Medium (34-66%) N/A N/A Exposed Structures: Fat Layer (Subcutaneous N/A N/A Tissue) Exposed: Yes Muscle: Yes Fascia: No Tendon: No Joint: No Bone: No Epithelialization: None N/A N/A Periwound Skin  Texture: Induration: Yes N/A N/A Excoriation: No Callus: No Crepitus: No Rash: No Scarring: No Periwound Skin Maceration: No N/A N/A Moisture: Dry/Scaly: No Periwound Skin Color: Erythema: Yes N/A N/A Mottled: Yes Atrophie Blanche: No Cyanosis: No Ecchymosis: No Hemosiderin Staining: No Pallor: No Rubor: No Erythema Location: Circumferential N/A N/A Temperature: Hot N/A N/A Tenderness on Yes N/A N/A Palpation: Wound Preparation: Ulcer Cleansing: N/A N/A Rinsed/Irrigated with Saline Topical Anesthetic Applied: Other: lidocaine 4% Treatment Notes Nebergall, Sherolyn ([id number]) Wound #1 (Right, Anterior Lower Leg) 1. Cleansed with: Clean wound with Normal Saline 2. Anesthetic Topical Lidocaine 4% cream to wound bed prior to debridement 4. Dressing Applied: Aquacel Ag 5. Secondary Dressing Applied Bordered Foam Dressing Electronic Signature(s) Signed: 03/26/2017 1:55:53 PM By: Evlyn Kanner MD, FACS Entered By: Evlyn Kanner on 03/26/2017 13:55:53 Welford, Fayola ([id number]) -------------------------------------------------------------------------------- Multi-Disciplinary Care Plan Details Patient Name: [name] Date of Service: 03/26/2017 1:00 PM Medical Record Number: [id number] Patient Account Number: [id number] Date of Birth/Sex: 12-07-1928 (83 y.o. Female) Treating RN: Huel Coventry Primary Care Reinhold Rickey: SYSTEM, PCP Other Clinician: Referring Albie Bazin: Altamese Cabal Treating Naz Denunzio/Extender: Rudene Re in Treatment: 4 Active Inactive ` Abuse / Safety / Falls / Self Care Management Nursing Diagnoses: History of Falls Impaired home maintenance Impaired physical mobility Potential for falls Potential for injury related to transfers Goals: Patient will not develop complications from immobility Date Initiated: 02/23/2017 Target Resolution Date: 07/26/2017 Goal Status: Active Patient will not experience any injury related to falls Date Initiated:  02/23/2017 Target Resolution Date: 07/26/2017 Goal Status: Active Patient will remain injury free related to falls Date Initiated: 02/23/2017 Target Resolution Date: 07/26/2017 Goal Status: Active Patient/caregiver will demonstrate safe use of adaptive devices to increase mobility Date Initiated: 02/23/2017 Target Resolution Date: 07/26/2017 Goal Status: Active Patient/caregiver will identify factors that restrict self-care and home management Date Initiated: 02/23/2017 Target Resolution Date: 07/26/2017 Goal Status: Active Patient/caregiver will verbalize understanding of skin care regimen Date Initiated: 02/23/2017 Target Resolution Date: 07/26/2017 Goal Status: Active Patient/caregiver will verbalize understanding of the importance to maintain current immunizations/vaccinations Date Initiated: 02/23/2017 Target Resolution Date: 07/26/2017 Goal Status: Active Patient/caregiver will verbalize/demonstrate measure taken to improve self care Date Initiated: 02/23/2017 Target Resolution Date: 07/26/2017 RABAB, CURRINGTON ([id number]) Goal Status: Active Patient/caregiver will verbalize/demonstrate measures taken to improve the patient's personal safety Date Initiated: 02/23/2017 Target  Resolution Date: 07/26/2017 Goal Status: Active Patient/caregiver will verbalize/demonstrate measures taken to prevent injury and/or falls Date Initiated: 02/23/2017 Target Resolution Date: 07/26/2017 Goal Status: Active Patient/caregiver will verbalize/demonstrate understanding of what to do in case of emergency Date Initiated: 02/23/2017 Target Resolution Date: 07/26/2017 Goal Status: Active Interventions: Call light and/or bell within patient's reach Assess Activities of Daily Living upon admission and as needed Assess fall risk on admission and as needed Assess: immobility, friction, shearing, incontinence upon admission and as needed Assess impairment of mobility on admission and as needed per  policy Assess personal safety and home safety (as indicated) on admission and as needed Assess self care needs on admission and as needed Provide education on basic hygiene Provide education on fall prevention Provide education on personal and home safety Provide education on safe transfers Treatment Activities: Education provided on Basic Hygiene : 03/09/2017 Notes: ` Orientation to the Wound Care Program Nursing Diagnoses: Knowledge deficit related to the wound healing center program Goals: Patient/caregiver will verbalize understanding of the Wound Healing Center Program Date Initiated: 02/23/2017 Target Resolution Date: 07/26/2017 Goal Status: Active Interventions: Provide education on orientation to the wound center Notes: Deina Lipsey, Bobby ([id number]) Wound/Skin Impairment Nursing Diagnoses: Impaired tissue integrity Knowledge deficit related to ulceration/compromised skin integrity Goals: Patient/caregiver will verbalize understanding of skin care regimen Date Initiated: 02/23/2017 Target Resolution Date: 07/26/2017 Goal Status: Active Ulcer/skin breakdown will have a volume reduction of 30% by week 4 Date Initiated: 02/23/2017 Target Resolution Date: 07/26/2017 Goal Status: Active Ulcer/skin breakdown will have a volume reduction of 50% by week 8 Date Initiated: 02/23/2017 Target Resolution Date: 07/26/2017 Goal Status: Active Ulcer/skin breakdown will have a volume reduction of 80% by week 12 Date Initiated: 02/23/2017 Target Resolution Date: 07/26/2017 Goal Status: Active Ulcer/skin breakdown will heal within 14 weeks Date Initiated: 02/23/2017 Target Resolution Date: 07/26/2017 Goal Status: Active Interventions: Assess patient/caregiver ability to obtain necessary supplies Assess patient/caregiver ability to perform ulcer/skin care regimen upon admission and as needed Assess ulceration(s) every visit Provide education on ulcer and skin care Treatment  Activities: Referred to DME Jaelon Gatley for dressing supplies : 02/23/2017 Skin care regimen initiated : 02/23/2017 Topical wound management initiated : 02/23/2017 Notes: Electronic Signature(s) Signed: 03/26/2017 4:37:17 PM By: Elliot Gurney, BSN, RN, CWS, Kim RN, BSN Entered By: Elliot Gurney, BSN, RN, CWS, Kim on 03/26/2017 13:29:29 [place], Tiffny ([id number]) -------------------------------------------------------------------------------- Pain Assessment Details Patient Name: [name] Date of Service: 03/26/2017 1:00 PM Medical Record Number: [id number] Patient Account Number: [id number] Date of Birth/Sex: [birth year] (83 y.o. Female) Treating RN: Huel Coventry Primary Care Hayla Hinger: SYSTEM, PCP Other Clinician: Referring Avonte Sensabaugh: Altamese Cabal Treating Shenelle Klas/Extender: Rudene Re in Treatment: 4 Active Problems Location of Pain Severity and Description of Pain Patient Has Paino Yes Site Locations Pain Location: Generalized Pain With Dressing Change: Yes Character of Pain Describe the Pain: Sharp, Stabbing Pain Management and Medication Current Pain Management: Goals for Pain Management Topical or injectable lidocaine is offered to patient for acute pain when surgical debridement is performed. If needed, Patient is instructed to use over the counter pain medication for the following 24-48 hours after debridement. Wound care MDs do not prescribed pain medications. Patient has chronic pain or uncontrolled pain. Patient has been instructed to make an appointment with their Primary Care Physician for pain management. Electronic Signature(s) Signed: 03/26/2017 4:37:17 PM By: Elliot Gurney, BSN, RN, CWS, Kim RN, BSN Entered By: Elliot Gurney, BSN, RN, CWS, Kim on 03/26/2017 13:20:07 [name] ([id number]) -------------------------------------------------------------------------------- Patient/Caregiver Education Details Patient Name: [name] Date  of Service: 03/26/2017 1:00 PM Medical Record Number:  [id number] Patient Account Number: [id number] Date of Birth/Gender: 1929/08/15 (83 y.o. Female) Treating RN: Huel Coventry Primary Care Physician: SYSTEM, PCP Other Clinician: Referring Physician: Altamese Cabal Treating Physician/Extender: Rudene Re in Treatment: 4 Education Assessment Education Provided To: Patient Education Topics Provided Wound/Skin Impairment: Handouts: Caring for Your Ulcer Methods: Demonstration Responses: State content correctly Electronic Signature(s) Signed: 03/26/2017 4:37:17 PM By: Elliot Gurney, BSN, RN, CWS, Kim RN, BSN Entered By: Elliot Gurney, BSN, RN, CWS, Kim on 03/26/2017 13:46:52 [place], Katrinna ([id number]) -------------------------------------------------------------------------------- Wound Assessment Details Patient Name: [name] Date of Service: 03/26/2017 1:00 PM Medical Record Number: [id number] Patient Account Number: [id number] Date of Birth/Sex: [birth year] (83 y.o. Female) Treating RN: Huel Coventry Primary Care Anne [place]: SYSTEM, PCP Other Clinician: Referring Ebbie Sorenson: Altamese Cabal Treating Samone Guhl/Extender: Rudene Re in Treatment: 4 Wound Status Wound Number: 1 Primary Trauma, Other Etiology: Wound Location: Right Lower Leg - Anterior Wound Open Wounding Event: Trauma Status: Date Acquired: 01/28/2017 Comorbid Cataracts, Arrhythmia, Congestive Weeks Of Treatment: 4 History: Heart Failure, Hypertension, Clustered Wound: No Osteoarthritis Photos Photo Uploaded By: Elliot Gurney, BSN, RN, CWS, Kim on 03/26/2017 13:30:51 Wound Measurements Length: (cm) 2 Width: (cm) 2 Depth: (cm) 0.9 Area: (cm) 3.142 Volume: (cm) 2.827 % Reduction in Area: 68% % Reduction in Volume: 90.4% Epithelialization: None Tunneling: Yes Position (o'clock): 1 Maximum Distance: (cm) 3.2 Undermining: Yes Starting Position (o'clock): 7 Ending Position (o'clock): 12 Maximum Distance: (cm) 4 Wound Description Full Thickness With  Exposed Classification: Support Structures Wound Margin: Epibole Exudate Large Amount: Exudate Type: Serous Farquhar, Roxana ([id number]) Foul Odor After Cleansing: No Slough/Fibrino Yes Exudate Color: amber Wound Bed Granulation Amount: Medium (34-66%) Exposed Structure Granulation Quality: Pink, Pale, Hyper-granulation Fascia Exposed: No Necrotic Amount: Medium (34-66%) Fat Layer (Subcutaneous Tissue) Exposed: Yes Necrotic Quality: Adherent Slough Tendon Exposed: No Muscle Exposed: Yes Necrosis of Muscle: No Joint Exposed: No Bone Exposed: No Periwound Skin Texture Texture Color No Abnormalities Noted: No No Abnormalities Noted: No Callus: No Atrophie Blanche: No Crepitus: No Cyanosis: No Excoriation: No Ecchymosis: No Induration: Yes Erythema: Yes Rash: No Erythema Location: Circumferential Scarring: No Hemosiderin Staining: No Mottled: Yes Moisture Pallor: No No Abnormalities Noted: No Rubor: No Dry / Scaly: No Maceration: No Temperature / Pain Temperature: Hot Tenderness on Palpation: Yes Wound Preparation Ulcer Cleansing: Rinsed/Irrigated with Saline Topical Anesthetic Applied: Other: lidocaine 4%, Treatment Notes Wound #1 (Right, Anterior Lower Leg) 1. Cleansed with: Clean wound with Normal Saline 2. Anesthetic Topical Lidocaine 4% cream to wound bed prior to debridement 4. Dressing Applied: Aquacel Ag 5. Secondary Dressing Applied Bordered Foam Dressing Electronic Signature(s) Signed: 03/26/2017 4:37:17 PM By: Elliot Gurney, BSN, RN, CWS, Kim RN, BSN Entered By: Elliot Gurney, BSN, RN, CWS, Kim on 03/26/2017 13:26:09 [place], Cherryl ([id number]) [place], Dula ([id number]) -------------------------------------------------------------------------------- Vitals Details Patient Name: [name] Date of Service: 03/26/2017 1:00 PM Medical Record Number: [id number] Patient Account Number: [id number] Date of Birth/Sex: [birth year] (83 y.o. Female) Treating RN: Huel Coventry Primary Care Nedim Oki: SYSTEM, PCP Other Clinician: Referring Zebbie Ace: Altamese Cabal Treating Jakaden Ouzts/Extender: Rudene Re in Treatment: 4 Vital Signs Time Taken: 13:20 Temperature (F): 97.8 Height (in): 64 Pulse (bpm): 80 Weight (lbs): 138 Respiratory Rate (breaths/min): 16 Body Mass Index (BMI): 23.7 Blood Pressure (mmHg): 148/62 Reference Range: 80 - 120 mg / dl Notes Take BP manual Electronic Signature(s) Signed: 03/26/2017 4:37:17 PM By: Elliot Gurney, BSN, RN, CWS, Kim RN, BSN Entered By: Elliot Gurney, BSN, RN, CWS, Kim on 03/26/2017 13:22:02

## 2017-03-29 ENCOUNTER — Encounter: Payer: Medicare Other | Attending: Surgery

## 2017-03-29 DIAGNOSIS — I110 Hypertensive heart disease with heart failure: Secondary | ICD-10-CM | POA: Insufficient documentation

## 2017-03-29 DIAGNOSIS — W19XXXD Unspecified fall, subsequent encounter: Secondary | ICD-10-CM | POA: Insufficient documentation

## 2017-03-29 DIAGNOSIS — S81811D Laceration without foreign body, right lower leg, subsequent encounter: Secondary | ICD-10-CM | POA: Insufficient documentation

## 2017-03-29 DIAGNOSIS — I509 Heart failure, unspecified: Secondary | ICD-10-CM | POA: Insufficient documentation

## 2017-03-29 DIAGNOSIS — M1990 Unspecified osteoarthritis, unspecified site: Secondary | ICD-10-CM | POA: Insufficient documentation

## 2017-03-29 DIAGNOSIS — I87321 Chronic venous hypertension (idiopathic) with inflammation of right lower extremity: Secondary | ICD-10-CM | POA: Insufficient documentation

## 2017-03-29 DIAGNOSIS — Z7982 Long term (current) use of aspirin: Secondary | ICD-10-CM | POA: Insufficient documentation

## 2017-03-29 NOTE — Progress Notes (Signed)
EVERLY, RUBALCAVA ([id number]) Visit Report for 03/26/2017 Chief Complaint Document Details Patient Name: [name], [name] Date of Service: 03/26/2017 1:00 PM Medical Record Number: [id number] Patient Account Number: [id number] Date of Birth/Sex: 03-20-1929 (83 y.o. Female) Treating RN: Huel Coventry Primary Care Provider: SYSTEM, PCP Other Clinician: Referring Provider: Altamese Cabal Treating Provider/Extender: Rudene Re in Treatment: 4 Information Obtained from: Patient Chief Complaint 02/23/17; patient is here for review of wound on the right anterior lower leg Electronic Signature(s) Signed: 03/26/2017 1:56:01 PM By: Evlyn Kanner MD, FACS Entered By: Evlyn Kanner on 03/26/2017 13:56:00 Iott, Maily ([id number]) -------------------------------------------------------------------------------- HPI Details Patient Name: [name] Date of Service: 03/26/2017 1:00 PM Medical Record Number: [id number] Patient Account Number: [id number] Date of Birth/Sex: 12-Nov-1928 (83 y.o. Female) Treating RN: Huel Coventry Primary Care Provider: SYSTEM, PCP Other Clinician: Referring Provider: Altamese Cabal Treating Provider/Extender: Rudene Re in Treatment: 4 History of Present Illness HPI Description: 02/23/17 this is an 83 year old woman who is at a fitness club about to sit down on a rowing machine. She fell and traumatized the anterior part of her right leg on a sharp metal surface. This caused immediate pain and bleeding. She stopped off on her way home at an urgent care ultimately was referred to the ER and by that time she had a significant hematoma of her right leg. She was followed in the urgent orthopedic clinic. She had a duplex ultrasound of the right leg on 5/10 that was negative for DVT x-ray of the right leg on 01/28/17 was negative including a CT scan of the tib-fib. The CT scan did show the subcutaneous hematoma which on 01/28/17 measured 7.7 cm craniocaudal by 2.5 cm x 5.6 cm  AP subcutaneous air was also identified felt likely secondary to small lacerations. There was no acute fracture. The patient has been continuing mostly with topical antibiotics on this wound. She did not have any compression. She is referred here from the urgent orthopedic clinic for review of this. She did not have a wound history she is not a diabetic. ABI in this clinic on the right was 1.1 on the left 1.0. She is on aspirin as her only anti-coagulant 03/02/17; the patient arrives for review of a traumatic wound on her right anterior leg with subsequent hematoma formation and considerable tissue loss on the right anterior lateral leg. We'll use silver alginate last week. We kept her under compression and the patient states that her pain was actually a lot better. Wound measures at 5 x 2.5 x 2. Consider home amount of undermining laterally by roughly 3.5 cm from 7 to 12:00 03/09/17; wound actually looks better however there is considerable undermining from roughly 7 to 12:00. No evidence of surrounding infection. A snap VAC was applied today 03/17/17; the patient continues to do quite well. There have been some logistic problems with the wound VAC however we have managed to keep this in place. She did fill out one of the canisters but didn't come in in follow-up. We've been using silver collagen under a snap VAC 03/23/17; on intake today the patient appeared to have a new tunnel superiorly in the wound by 4 cm. This is separated from the undermining from roughly 7 or 11:00 she has had chronically. This undermines at 1.8 cm. We've been using silver collagen under a snapVAC 03/26/2017 -- patient of Dr. Leanord Hawking who came in to see me today because she has developed swelling and redness over the area where the snap vacuum was applied and has  been having significant discomfort. Culture taken last week is not yet in Electronic Signature(s) Signed: 03/26/2017 1:56:47 PM By: Evlyn Kanner MD, FACS Entered  By: Evlyn Kanner on 03/26/2017 13:56:47 Friedland, Ashwika ([id number]) -------------------------------------------------------------------------------- Physical Exam Details Patient Name: [name] Date of Service: 03/26/2017 1:00 PM Medical Record Number: [id number] Patient Account Number: [id number] Date of Birth/Sex: [birth year] (83 y.o. Female) Treating RN: Huel Coventry Primary Care Provider: SYSTEM, PCP Other Clinician: Referring Provider: Altamese Cabal Treating Provider/Extender: Rudene Re in Treatment: 4 Constitutional . Pulse regular. Respirations normal and unlabored. Afebrile. . Eyes Nonicteric. Reactive to light. Ears, Nose, Mouth, and Throat Lips, teeth, and gums WNL.Marland Kitchen Moist mucosa without lesions. Neck supple and nontender. No palpable supraclavicular or cervical adenopathy. Normal sized without goiter. Respiratory WNL. No retractions.. Breath sounds WNL, No rubs, rales, rhonchi, or wheeze.. Cardiovascular Heart rhythm and rate regular, no murmur or gallop.. Pedal Pulses WNL. No clubbing, cyanosis or edema. Lymphatic No adneopathy. No adenopathy. No adenopathy. Musculoskeletal Adexa without tenderness or enlargement.. Digits and nails w/o clubbing, cyanosis, infection, petechiae, ischemia, or inflammatory conditions.. Integumentary (Hair, Skin) No suspicious lesions. No crepitus or fluctuance. No peri-wound warmth or erythema. No masses.Marland Kitchen Psychiatric Judgement and insight Intact.. No evidence of depression, anxiety, or agitation.. Notes There is a large amount of localized edema and cellulitis on the right lateral calf and this seems to be approximately 6 inches around the main wound. She has got significant undermining between the 7 and 2:00 position and some of it goes down to approximately 3-1/2 cm. There was also some old hematoma and debris which I was able to remove with moist saline gauze. Electronic Signature(s) Signed: 03/26/2017 1:58:17 PM By: Evlyn Kanner MD, FACS Entered By: Evlyn Kanner on 03/26/2017 13:58:16 Wolff, Mekenzie ([id number]) -------------------------------------------------------------------------------- Physician Orders Details Patient Name: [name] Date of Service: 03/26/2017 1:00 PM Medical Record Number: [id number] Patient Account Number: [id number] Date of Birth/Sex: February 27, 1929 (83 y.o. Female) Treating RN: Huel Coventry Primary Care Provider: SYSTEM, PCP Other Clinician: Referring Provider: Altamese Cabal Treating Provider/Extender: Rudene Re in Treatment: 4 Verbal / Phone Orders: No Diagnosis Coding Wound Cleansing Wound #1 Right,Anterior Lower Leg o Cleanse wound with mild soap and water o No tub bath. Anesthetic Wound #1 Right,Anterior Lower Leg o Topical Lidocaine 4% cream applied to wound bed prior to debridement Skin Barriers/Peri-Wound Care Wound #1 Right,Anterior Lower Leg o Skin Prep Primary Wound Dressing Wound #1 Right,Anterior Lower Leg o Aquacel Ag - pack lightly into wound Secondary Dressing Wound #1 Right,Anterior Lower Leg o Boardered Foam Dressing Dressing Change Frequency Wound #1 Right,Anterior Lower Leg o Three times weekly Follow-up Appointments Wound #1 Right,Anterior Lower Leg o Return Appointment in 1 week. Additional Orders / Instructions Wound #1 Right,Anterior Lower Leg o Increase protein intake. o Activity as tolerated Mentink, Chihiro ([id number]) Negative Pressure Wound Therapy Wound #1 Right,Anterior Lower Leg o Place NPWT on HOLD. Medications-please add to medication list. Wound #1 Right,Anterior Lower Leg o P.O. Antibiotics - Cipro Patient Medications Allergies: PCN Notifications Medication Indication Start End Cipro 03/26/2017 DOSE oral 500 mg tablet - tablet oral bid Electronic Signature(s) Signed: 03/26/2017 1:48:04 PM By: Evlyn Kanner MD, FACS Entered By: Evlyn Kanner on 03/26/2017 13:48:03 Obey, Adlee  ([id number]) -------------------------------------------------------------------------------- Problem List Details Patient Name: [name] Date of Service: 03/26/2017 1:00 PM Medical Record Number: [id number] Patient Account Number: [id number] Date of Birth/Sex: 14-May-1929 (83 y.o. Female) Treating RN: Huel Coventry Primary Care Provider: SYSTEM, PCP Other Clinician: Referring Provider: Altamese Cabal Treating Provider/Extender: Meyer Russel,  Brandin Dilday Weeks in Treatment: 4 Active Problems ICD-10 Encounter Code Description Active Date Diagnosis S81.811D Laceration without foreign body, right lower leg, 02/23/2017 Yes subsequent encounter I87.321 Chronic venous hypertension (idiopathic) with 02/23/2017 Yes inflammation of right lower extremity Inactive Problems Resolved Problems Electronic Signature(s) Signed: 03/26/2017 1:55:48 PM By: Evlyn Kanner MD, FACS Entered By: Evlyn Kanner on 03/26/2017 13:55:47 Harwick, Percilla ([id number]) -------------------------------------------------------------------------------- Progress Note Details Patient Name: [name] Date of Service: 03/26/2017 1:00 PM Medical Record Number: [id number] Patient Account Number: [id number] Date of Birth/Sex: 1929/07/26 (83 y.o. Female) Treating RN: Huel Coventry Primary Care Provider: SYSTEM, PCP Other Clinician: Referring Provider: Altamese Cabal Treating Provider/Extender: Rudene Re in Treatment: 4 Subjective Chief Complaint Information obtained from Patient 02/23/17; patient is here for review of wound on the right anterior lower leg History of Present Illness (HPI) 02/23/17 this is an 83 year old woman who is at a fitness club about to sit down on a rowing machine. She fell and traumatized the anterior part of her right leg on a sharp metal surface. This caused immediate pain and bleeding. She stopped off on her way home at an urgent care ultimately was referred to the ER and by that time she had a significant  hematoma of her right leg. She was followed in the urgent orthopedic clinic. She had a duplex ultrasound of the right leg on 5/10 that was negative for DVT x-ray of the right leg on 01/28/17 was negative including a CT scan of the tib-fib. The CT scan did show the subcutaneous hematoma which on 01/28/17 measured 7.7 cm craniocaudal by 2.5 cm x 5.6 cm AP subcutaneous air was also identified felt likely secondary to small lacerations. There was no acute fracture. The patient has been continuing mostly with topical antibiotics on this wound. She did not have any compression. She is referred here from the urgent orthopedic clinic for review of this. She did not have a wound history she is not a diabetic. ABI in this clinic on the right was 1.1 on the left 1.0. She is on aspirin as her only anti-coagulant 03/02/17; the patient arrives for review of a traumatic wound on her right anterior leg with subsequent hematoma formation and considerable tissue loss on the right anterior lateral leg. We'll use silver alginate last week. We kept her under compression and the patient states that her pain was actually a lot better. Wound measures at 5 x 2.5 x 2. Consider home amount of undermining laterally by roughly 3.5 cm from 7 to 12:00 03/09/17; wound actually looks better however there is considerable undermining from roughly 7 to 12:00. No evidence of surrounding infection. A snap VAC was applied today 03/17/17; the patient continues to do quite well. There have been some logistic problems with the wound VAC however we have managed to keep this in place. She did fill out one of the canisters but didn't come in in follow-up. We've been using silver collagen under a snap VAC 03/23/17; on intake today the patient appeared to have a new tunnel superiorly in the wound by 4 cm. This is separated from the undermining from roughly 7 or 11:00 she has had chronically. This undermines at 1.8 cm. We've been using silver collagen  under a snapVAC 03/26/2017 -- patient of Dr. Leanord Hawking who came in to see me today because she has developed swelling and redness over the area where the snap vacuum was applied and has been having significant discomfort. Culture taken last week is not yet in [facility] [place] (  [id number]) Objective Constitutional Pulse regular. Respirations normal and unlabored. Afebrile. Vitals Time Taken: 1:20 PM, Height: 64 in, Weight: 138 lbs, BMI: 23.7, Temperature: 97.8 F, Pulse: 80 bpm, Respiratory Rate: 16 breaths/min, Blood Pressure: 148/62 mmHg. General Notes: Take BP manual Eyes Nonicteric. Reactive to light. Ears, Nose, Mouth, and Throat Lips, teeth, and gums WNL.Marland Kitchen. Moist mucosa without lesions. Neck supple and nontender. No palpable supraclavicular or cervical adenopathy. Normal sized without goiter. Respiratory WNL. No retractions.. Breath sounds WNL, No rubs, rales, rhonchi, or wheeze.. Cardiovascular Heart rhythm and rate regular, no murmur or gallop.. Pedal Pulses WNL. No clubbing, cyanosis or edema. Lymphatic No adneopathy. No adenopathy. No adenopathy. Musculoskeletal Adexa without tenderness or enlargement.. Digits and nails w/o clubbing, cyanosis, infection, petechiae, ischemia, or inflammatory conditions.Marland Kitchen. Psychiatric Judgement and insight Intact.. No evidence of depression, anxiety, or agitation.. General Notes: There is a large amount of localized edema and cellulitis on the right lateral calf and this seems to be approximately 6 inches around the main wound. She has got significant undermining between the 7 and 2:00 position and some of it goes down to approximately 3-1/2 cm. There was also some old hematoma and debris which I was able to remove with moist saline gauze. Integumentary (Hair, Skin) No suspicious lesions. No crepitus or fluctuance. No peri-wound warmth or erythema. No masses.. Wound #1 status is Open. Original cause of wound was Trauma. The wound is located on  the Right,Anterior Lower Leg. The wound measures 2cm length x 2cm width x 0.9cm depth; 3.142cm^2 area and 2.827cm^3 volume. There is muscle and Fat Layer (Subcutaneous Tissue) Exposed exposed. [facility], [facility] ([id number]) Tunneling has been noted at 1:00 with a maximum distance of 3.2cm. Undermining begins at 7:00 and ends at 12:00 with a maximum distance of 4cm. There is a large amount of serous drainage noted. The wound margin is epibole. There is medium (34-66%) pink, pale granulation within the wound bed. There is a medium (34-66%) amount of necrotic tissue within the wound bed including Adherent Slough. The periwound skin appearance exhibited: Induration, Mottled, Erythema. The periwound skin appearance did not exhibit: Callus, Crepitus, Excoriation, Rash, Scarring, Dry/Scaly, Maceration, Atrophie Blanche, Cyanosis, Ecchymosis, Hemosiderin Staining, Pallor, Rubor. The surrounding wound skin color is noted with erythema which is circumferential. Periwound temperature was noted as Hot. The periwound has tenderness on palpation. Assessment Active Problems ICD-10 S81.811D - Laceration without foreign body, right lower leg, subsequent encounter I87.321 - Chronic venous hypertension (idiopathic) with inflammation of right lower extremity after detailed examination and review of the patient's condition I have recommended: 1. Discontinuation of the snap vacuum system and packing this wound lightly with silver alginate to be changed every other day. 2. ciprofloxacin 500 mg twice a day to be put on emperically, awaiting culture report. 3. Elevation and exercise has been discussed with her in great detail 4. We'll see her back for her for a nurse visit early next week. she will then follow-up with Dr. Leanord Hawkingobson as planned Plan Wound Cleansing: Wound #1 Right,Anterior Lower Leg: Cleanse wound with mild soap and water No tub bath. Anesthetic: Wound #1 Right,Anterior Lower Leg: Topical Lidocaine 4%  cream applied to wound bed prior to debridement Skin Barriers/Peri-Wound Care: Wound #1 Right,Anterior Lower Leg: Skin Prep Primary Wound Dressing: Wound #1 Right,Anterior Lower Leg: Aquacel Ag - pack lightly into wound Lasorsa, Isabell ([id number]) Secondary Dressing: Wound #1 Right,Anterior Lower Leg: Boardered Foam Dressing Dressing Change Frequency: Wound #1 Right,Anterior Lower Leg: Three times weekly Follow-up Appointments: Wound #1 Right,Anterior Lower  Leg: Return Appointment in 1 week. Additional Orders / Instructions: Wound #1 Right,Anterior Lower Leg: Increase protein intake. Activity as tolerated Negative Pressure Wound Therapy: Wound #1 Right,Anterior Lower Leg: Place NPWT on HOLD. Medications-please add to medication list.: Wound #1 Right,Anterior Lower Leg: P.O. Antibiotics - Cipro The following medication(s) was prescribed: Cipro oral 500 mg tablet tablet oral bid starting 03/26/2017 after detailed examination and review of the patient's condition I have recommended: 1. Discontinuation of the snap vacuum system and packing this wound lightly with silver alginate to be changed every other day. 2. ciprofloxacin 500 mg twice a day to be put on emperically, awaiting culture report. 3. Elevation and exercise has been discussed with her in great detail 4. We'll see her back for her for a nurse visit early next week. she will then follow-up with Dr. Leanord Hawking as planned Electronic Signature(s) Signed: 03/26/2017 2:00:38 PM By: Evlyn Kanner MD, FACS Entered By: Evlyn Kanner on 03/26/2017 14:00:37 Cheraw, Jalila ([id number]) -------------------------------------------------------------------------------- SuperBill Details Patient Name: [name] Date of Service: 03/26/2017 Medical Record Number: [id number] Patient Account Number: [id number] Date of Birth/Sex: 22-Nov-1928 (83 y.o. Female) Treating RN: Huel Coventry Primary Care Provider: SYSTEM, PCP Other Clinician: Referring  Provider: Altamese Cabal Treating Provider/Extender: Rudene Re in Treatment: 4 Diagnosis Coding ICD-10 Codes Code Description [phone] Laceration without foreign body, right lower leg, subsequent encounter I87.321 Chronic venous hypertension (idiopathic) with inflammation of right lower extremity Facility Procedures CPT4 Code: [id number] Description: [phone] - WOUND CARE VISIT-LEV 2 EST PT Modifier: Quantity: 1 Physician Procedures CPT4: Description Modifier Quantity Code [id number] - WC PHYS LEVEL 3 - EST PT 1 ICD-10 Description Diagnosis S81.811D Laceration without foreign body, right lower leg, subsequent encounter I87.321 Chronic venous hypertension (idiopathic) with  inflammation of right lower extremity Electronic Signature(s) Signed: 03/26/2017 2:00:49 PM By: Evlyn Kanner MD, FACS Entered By: Evlyn Kanner on 03/26/2017 14:00:48

## 2017-03-30 ENCOUNTER — Ambulatory Visit: Payer: Medicare Other | Admitting: Internal Medicine

## 2017-03-30 NOTE — Progress Notes (Signed)
[name], [name] ([id number]) Visit Report for 03/29/2017 Arrival Information Details Patient Name: [name], [name] Date of Service: 03/29/2017 12:00 PM Medical Record Number: [id number] Patient Account Number: [id number] Date of Birth/Sex: 09/08/1929 (83 y.o. Female) Treating RN: Huel CoventryWoody, Kim Primary Care Neeraj Housand: SYSTEM, PCP Other Clinician: Referring Latiffany Harwick: Altamese CabalJONES, MAURICE Treating Medard Decuir/Extender: Rudene ReBritto, Errol Weeks in Treatment: 4 Visit Information History Since Last Visit Added or deleted any medications: No Patient Arrived: Gilmer MorCane Any new allergies or adverse reactions: No Arrival Time: 11:55 Had a fall or experienced change in No Accompanied By: self activities of daily living that may affect Transfer Assistance: None risk of falls: Patient Identification Verified: Yes Signs or symptoms of abuse/neglect since last No Secondary Verification Process Completed: Yes visito Patient Requires Transmission-Based No Hospitalized since last visit: No Precautions: Pain Present Now: No Patient Has Alerts: No Electronic Signature(s) Signed: 03/29/2017 5:28:17 PM By: Elliot GurneyWoody, BSN, RN, CWS, Kim RN, BSN Entered By: Elliot GurneyWoody, BSN, RN, CWS, Kim on 03/29/2017 11:57:30 [place] Lakes[place], Aljean ([id number]) -------------------------------------------------------------------------------- Wound Assessment Details Patient Name: [name], [name] Date of Service: 03/29/2017 12:00 PM Medical Record Number: [id number] Patient Account Number: [id number] Date of Birth/Sex: 07/31/1929 (83 y.o. Female) Treating RN: Huel CoventryWoody, Kim Primary Care Satish Hammers: SYSTEM, PCP Other Clinician: Referring Jodie Cavey: Altamese CabalJONES, MAURICE Treating Zaharah Amir/Extender: Rudene ReBritto, Errol Weeks in Treatment: 4 Wound Status Wound Number: 1 Primary Etiology: Trauma, Other Wound Location: Right, Anterior Lower Leg Wound Status: Open Wounding Event: Trauma Date Acquired: 01/28/2017 Weeks Of Treatment: 4 Clustered Wound: No Wound Measurements Length: (cm) 2 Width:  (cm) 1.8 Depth: (cm) 0.9 Area: (cm) 2.827 Volume: (cm) 2.545 % Reduction in Area: 71.2% % Reduction in Volume: 91.4% Wound Description Full Thickness With Exposed Support Classification: Structures Periwound Skin Texture Texture Color No Abnormalities Noted: No No Abnormalities Noted: No Moisture No Abnormalities Noted: No Electronic Signature(s) Signed: 03/29/2017 5:28:17 PM By: Elliot GurneyWoody, BSN, RN, CWS, Kim RN, BSN Entered By: Elliot GurneyWoody, BSN, RN, CWS, Kim on 03/29/2017 12:02:37

## 2017-04-01 DIAGNOSIS — I110 Hypertensive heart disease with heart failure: Secondary | ICD-10-CM | POA: Diagnosis not present

## 2017-04-01 DIAGNOSIS — Z7982 Long term (current) use of aspirin: Secondary | ICD-10-CM | POA: Diagnosis not present

## 2017-04-01 DIAGNOSIS — S81811D Laceration without foreign body, right lower leg, subsequent encounter: Secondary | ICD-10-CM | POA: Diagnosis present

## 2017-04-01 DIAGNOSIS — M1990 Unspecified osteoarthritis, unspecified site: Secondary | ICD-10-CM | POA: Diagnosis not present

## 2017-04-01 DIAGNOSIS — W19XXXD Unspecified fall, subsequent encounter: Secondary | ICD-10-CM | POA: Diagnosis not present

## 2017-04-01 DIAGNOSIS — I87321 Chronic venous hypertension (idiopathic) with inflammation of right lower extremity: Secondary | ICD-10-CM | POA: Diagnosis not present

## 2017-04-01 DIAGNOSIS — I509 Heart failure, unspecified: Secondary | ICD-10-CM | POA: Diagnosis not present

## 2017-04-01 NOTE — Progress Notes (Signed)
[name] Mccullough, [name] Mccullough ([id number]) Visit Report for 04/01/2017 Arrival Information Details Patient Name: [name] Mccullough, [name] Date of Service: 04/01/2017 2:15 PM Medical Record Number: [id number] Patient Account Number: [id number] Date of Birth/Sex: 04/08/1929 (83 y.o. Female) Treating RN: Huel CoventryWoody, Kim Primary Care Nikka Hakimian: SYSTEM, PCP Other Clinician: Referring Kassim Guertin: Altamese CabalJONES, MAURICE Treating Britania Shreeve/Extender: Rudene ReBritto, Errol Weeks in Treatment: 5 Visit Information History Since Last Visit Added or deleted any medications: No Patient Arrived: Ambulatory Any new allergies or adverse reactions: No Arrival Time: 14:08 Had a fall or experienced change in No Accompanied By: son in law in activities of daily living that may affect lobby risk of falls: Transfer Assistance: None Signs or symptoms of abuse/neglect since last No Patient Identification Verified: Yes visito Secondary Verification Process Yes Hospitalized since last visit: No Completed: Pain Present Now: No Patient Requires Transmission- No Based Precautions: Patient Has Alerts: No Electronic Signature(s) Signed: 04/01/2017 3:33:51 PM By: Elliot GurneyWoody, BSN, RN, CWS, Kim RN, BSN Entered By: Elliot GurneyWoody, BSN, RN, CWS, Kim on 04/01/2017 14:12:44 [name], [name] ([id number]) -------------------------------------------------------------------------------- Clinic Level of Care Assessment Details Patient Name: [name] Mccullough, [name] Mccullough Date of Service: 04/01/2017 2:15 PM Medical Record Number: [id number] Patient Account Number: [id number] Date of Birth/Sex: 05/21/1929 (83 y.o. Female) Treating RN: Huel CoventryWoody, Kim Primary Care Tansy Lorek: SYSTEM, PCP Other Clinician: Referring Frederica Chrestman: Altamese CabalJONES, MAURICE Treating Sava Proby/Extender: Rudene ReBritto, Errol Weeks in Treatment: 5 Clinic Level of Care Assessment Items TOOL 4 Quantity Score []  - Use when only an EandM is performed on FOLLOW-UP visit 0 ASSESSMENTS - Nursing Assessment / Reassessment []  - Reassessment of Co-morbidities (includes updates  in patient status) 0 []  - Reassessment of Adherence to Treatment Plan 0 ASSESSMENTS - Wound and Skin Assessment / Reassessment X - Simple Wound Assessment / Reassessment - one wound 1 5 []  - Complex Wound Assessment / Reassessment - multiple wounds 0 []  - Dermatologic / Skin Assessment (not related to wound area) 0 ASSESSMENTS - Focused Assessment []  - Circumferential Edema Measurements - multi extremities 0 []  - Nutritional Assessment / Counseling / Intervention 0 []  - Lower Extremity Assessment (monofilament, tuning fork, pulses) 0 []  - Peripheral Arterial Disease Assessment (using hand held doppler) 0 ASSESSMENTS - Ostomy and/or Continence Assessment and Care []  - Incontinence Assessment and Management 0 []  - Ostomy Care Assessment and Management (repouching, etc.) 0 PROCESS - Coordination of Care []  - Simple Patient / Family Education for ongoing care 0 []  - Complex (extensive) Patient / Family Education for ongoing care 0 []  - Staff obtains ChiropractorConsents, Records, Test Results / Process Orders 0 []  - Staff telephones HHA, Nursing Homes / Clarify orders / etc 0 []  - Routine Transfer to another Facility (non-emergent condition) 0 [name] Mccullough ([id number]) []  - Routine Hospital Admission (non-emergent condition) 0 []  - New Admissions / Manufacturing engineernsurance Authorizations / Ordering NPWT, Apligraf, etc. 0 []  - Emergency Hospital Admission (emergent condition) 0 []  - Simple Discharge Coordination 0 []  - Complex (extensive) Discharge Coordination 0 PROCESS - Special Needs []  - Pediatric / Minor Patient Management 0 []  - Isolation Patient Management 0 []  - Hearing / Language / Visual special needs 0 []  - Assessment of Community assistance (transportation, D/C planning, etc.) 0 []  - Additional assistance / Altered mentation 0 []  - Support Surface(s) Assessment (bed, cushion, seat, etc.) 0 INTERVENTIONS - Wound Cleansing / Measurement X - Simple Wound Cleansing - one wound 1 5 []  - Complex Wound  Cleansing - multiple wounds 0 X - Wound Imaging (photographs - any number of wounds) 1 5 []  - Wound Tracing (instead of  photographs) 0 X - Simple Wound Measurement - one wound 1 5 []  - Complex Wound Measurement - multiple wounds 0 INTERVENTIONS - Wound Dressings X - Small Wound Dressing one or multiple wounds 1 10 []  - Medium Wound Dressing one or multiple wounds 0 []  - Large Wound Dressing one or multiple wounds 0 []  - Application of Medications - topical 0 []  - Application of Medications - injection 0 INTERVENTIONS - Miscellaneous []  - External ear exam 0 [name] Mccullough ([id number]) []  - Specimen Collection (cultures, biopsies, blood, body fluids, etc.) 0 []  - Specimen(s) / Culture(s) sent or taken to Lab for analysis 0 []  - Patient Transfer (multiple staff / Michiel Sites Lift / Similar devices) 0 []  - Simple Staple / Suture removal (25 or less) 0 []  - Complex Staple / Suture removal (26 or more) 0 []  - Hypo / Hyperglycemic Management (close monitor of Blood Glucose) 0 []  - Ankle / Brachial Index (ABI) - do not check if billed separately 0 []  - Vital Signs 0 Has the patient been seen at the hospital within the last three years: Yes Total Score: 30 Level Of Care: New/Established - Level 1 Electronic Signature(s) Signed: 04/01/2017 3:33:51 PM By: Elliot Gurney, BSN, RN, CWS, Kim RN, BSN Entered By: Elliot Gurney, BSN, RN, CWS, Kim on 04/01/2017 14:22:54 [name] Mccullough ([id number]) -------------------------------------------------------------------------------- Encounter Discharge Information Details Patient Name: [name] Mccullough Date of Service: 04/01/2017 2:15 PM Medical Record Number: [id number] Patient Account Number: [id number] Date of Birth/Sex: [birth year] (83 y.o. Female) Treating RN: Huel Coventry Primary Care Cherrise Occhipinti: SYSTEM, PCP Other Clinician: Referring Malyna Budney: Altamese Cabal Treating Aaleeyah Bias/Extender: Rudene Re in Treatment: 5 Encounter Discharge Information Items Discharge Pain Level:  0 Discharge Condition: Stable Ambulatory Status: Cane Discharge Destination: Home Private Transportation: Auto son in Accompanied By: law Schedule Follow-up Appointment: Yes Medication Reconciliation completed and Yes provided to Patient/Care Giara Mcgaughey: Clinical Summary of Care: Electronic Signature(s) Signed: 04/01/2017 3:33:51 PM By: Elliot Gurney, BSN, RN, CWS, Kim RN, BSN Entered By: Elliot Gurney, BSN, RN, CWS, Kim on 04/01/2017 14:22:30 [name] Mccullough ([id number]) -------------------------------------------------------------------------------- Patient/Caregiver Education Details Patient Name: [name] Mccullough Date of Service: 04/01/2017 2:15 PM Medical Record Number: [id number] Patient Account Number: [id number] Date of Birth/Gender: 1928-10-21 (83 y.o. Female) Treating RN: Huel Coventry Primary Care Physician: SYSTEM, PCP Other Clinician: Referring Physician: Altamese Cabal Treating Physician/Extender: Rudene Re in Treatment: 5 Education Assessment Education Provided To: Patient Education Topics Provided Wound/Skin Impairment: Handouts: Caring for Your Ulcer Methods: Demonstration, Explain/Verbal Responses: State content correctly Electronic Signature(s) Signed: 04/01/2017 3:33:51 PM By: Elliot Gurney, BSN, RN, CWS, Kim RN, BSN Entered By: Elliot Gurney, BSN, RN, CWS, Kim on 04/01/2017 14:22:12 [name] Mccullough ([id number]) -------------------------------------------------------------------------------- Wound Assessment Details Patient Name: [name] Mccullough Date of Service: 04/01/2017 2:15 PM Medical Record Number: [id number] Patient Account Number: [id number] Date of Birth/Sex: 1929-08-03 (83 y.o. Female) Treating RN: Huel Coventry Primary Care Bostyn Kunkler: SYSTEM, PCP Other Clinician: Referring Hunter Pinkard: Altamese Cabal Treating Vail Basista/Extender: Rudene Re in Treatment: 5 Wound Status Wound Number: 1 Primary Trauma, Other Etiology: Wound Location: Right Lower Leg - Anterior Wound Open Wounding  Event: Trauma Status: Date Acquired: 01/28/2017 Comorbid Cataracts, Arrhythmia, Congestive Weeks Of Treatment: 5 History: Heart Failure, Hypertension, Clustered Wound: No Osteoarthritis Photos Wound Measurements Length: (cm) 2.2 Width: (cm) 2 Depth: (cm) 0.6 Area: (cm) 3.456 Volume: (cm) 2.073 % Reduction in Area: 64.8% % Reduction in Volume: 93% Tunneling: Yes Position (o'clock): 11 Maximum Distance: (cm) 4.2 Undermining: Yes Starting Position (o'clock): 7 Ending Position (o'clock): 10 Maximum Distance: (cm) 1.5 Wound Description Full Thickness  With Exposed Classification: Support Structures Wound Margin: Epibole Exudate Large Amount: Exudate Type: Serous Exudate Color: amber Saline, Biviana ([id number]) Foul Odor After Cleansing: No Slough/Fibrino Yes Wound Bed Granulation Amount: Medium (34-66%) Exposed Structure Granulation Quality: Red, Hyper-granulation Fascia Exposed: No Necrotic Amount: Medium (34-66%) Fat Layer (Subcutaneous Tissue) Exposed: Yes Necrotic Quality: Adherent Slough Tendon Exposed: No Muscle Exposed: No Joint Exposed: No Bone Exposed: No Periwound Skin Texture Texture Color No Abnormalities Noted: No No Abnormalities Noted: No Induration: Yes Temperature / Pain Moisture Temperature: No Abnormality No Abnormalities Noted: No Wound Preparation Ulcer Cleansing: Rinsed/Irrigated with Saline Topical Anesthetic Applied: None Treatment Notes Wound #1 (Right, Anterior Lower Leg) 1. Cleansed with: Clean wound with Normal Saline 4. Dressing Applied: Aquacel Ag 5. Secondary Dressing Applied Bordered Foam Dressing Electronic Signature(s) Signed: 04/01/2017 3:33:51 PM By: Elliot Gurney, BSN, RN, CWS, Kim RN, BSN Entered By: Elliot Gurney, BSN, RN, CWS, Kim on 04/01/2017 14:14:50

## 2017-04-06 ENCOUNTER — Encounter: Payer: Medicare Other | Admitting: Physician Assistant

## 2017-04-06 DIAGNOSIS — S81811D Laceration without foreign body, right lower leg, subsequent encounter: Secondary | ICD-10-CM | POA: Diagnosis not present

## 2017-04-08 NOTE — Progress Notes (Signed)
[name], Navayah ([id number]) Visit Report for 04/06/2017 Arrival Information Details Patient Name: [name], [name] Date of Service: 04/06/2017 3:00 PM Medical Record Number: [id number] Patient Account Number: [id number] Date of Birth/Sex: 03/25/1929 (83 y.o. Female) Treating RN: Huel CoventryWoody, Kim Primary Care Antonios Ostrow: SYSTEM, PCP Other Clinician: Referring Lasharn Bufkin: Altamese CabalJONES, MAURICE Treating Brigett Estell/Extender: Altamese CarolinaOBSON, MICHAEL G Weeks in Treatment: 6 Visit Information History Since Last Visit Added or deleted any medications: No Patient Arrived: Ambulatory Any new allergies or adverse reactions: No Arrival Time: 15:00 Had a fall or experienced change in No Accompanied By: self activities of daily living that may affect Transfer Assistance: None risk of falls: Patient Identification Verified: Yes Signs or symptoms of abuse/neglect since last No Secondary Verification Process Yes visito Completed: Hospitalized since last visit: No Patient Requires Transmission-Based No Has Dressing in Place as Prescribed: Yes Precautions: Pain Present Now: No Patient Has Alerts: No Electronic Signature(s) Signed: 04/06/2017 5:36:29 PM By: Elliot GurneyWoody, BSN, RN, CWS, Kim RN, BSN Entered By: Elliot GurneyWoody, BSN, RN, CWS, Kim on 04/06/2017 15:00:33 [name], Frank ([id number]) -------------------------------------------------------------------------------- Encounter Discharge Information Details Patient Name: [name], [name] Date of Service: 04/06/2017 3:00 PM Medical Record Number: [id number] Patient Account Number: [id number] Date of Birth/Sex: 09/05/1929 (83 y.o. Female) Treating RN: Huel CoventryWoody, Kim Primary Care Mayreli Alden: SYSTEM, PCP Other Clinician: Referring Toivo Bordon: Altamese CabalJONES, MAURICE Treating Teaghan Formica/Extender: Linwood DibblesSTONE III, HOYT Weeks in Treatment: 6 Encounter Discharge Information Items Discharge Pain Level: 0 Discharge Condition: Stable Ambulatory Status: Cane Discharge Destination: Home Private Transportation: Auto Accompanied  By: self Schedule Follow-up Appointment: Yes Medication Reconciliation completed and Yes provided to Patient/Care Laterrance Nauta: Clinical Summary of Care: Electronic Signature(s) Signed: 04/06/2017 5:36:29 PM By: Elliot GurneyWoody, BSN, RN, CWS, Kim RN, BSN Entered By: Elliot GurneyWoody, BSN, RN, CWS, Kim on 04/06/2017 15:58:29 PonyBERMAN, Raylyn ([id number]) -------------------------------------------------------------------------------- Lower Extremity Assessment Details Patient Name: [name], [name] Date of Service: 04/06/2017 3:00 PM Medical Record Number: [id number] Patient Account Number: [id number] Date of Birth/Sex: 10/21/1928 (83 y.o. Female) Treating RN: Huel CoventryWoody, Kim Primary Care Annabel Gibeau: SYSTEM, PCP Other Clinician: Referring Xerxes Agrusa: Altamese CabalJONES, MAURICE Treating Donevan Biller/Extender: Altamese CarolinaOBSON, MICHAEL G Weeks in Treatment: 6 Edema Assessment Assessed: [Left: No] [Right: No] Edema: [Left: Ye] [Right: s] Calf Left: Right: Point of Measurement: 28 cm From Medial Instep cm 36 cm Ankle Left: Right: Point of Measurement: 8 cm From Medial Instep cm 21.4 cm Vascular Assessment Pulses: Dorsalis Pedis Palpable: [Right:Yes] Posterior Tibial Extremity colors, hair growth, and conditions: Extremity Color: [Right:Normal] Hair Growth on Extremity: [Right:No] Temperature of Extremity: [Right:Warm] Capillary Refill: [Right:< 3 seconds] Dependent Rubor: [Right:No] Blanched when Elevated: [Right:No] Lipodermatosclerosis: [Right:No] Toe Nail Assessment Left: Right: Thick: No Discolored: No Deformed: No Improper Length and Hygiene: No Electronic Signature(s) Signed: 04/06/2017 5:36:29 PM By: Elliot GurneyWoody, BSN, RN, CWS, Kim RN, BSN [place]utchenvilleBERMAN, Angi ([id number]) Entered By: Elliot GurneyWoody, BSN, RN, CWS, Kim on 04/06/2017 15:10:24 Dorrough, Savannaha ([id number]) -------------------------------------------------------------------------------- Multi Wound Chart Details Patient Name: [name], [name] Date of Service: 04/06/2017 3:00 PM Medical Record Number:  [id number] Patient Account Number: [id number] Date of Birth/Sex: 02/25/1929 (83 y.o. Female) Treating RN: Huel CoventryWoody, Kim Primary Care Trevar Boehringer: SYSTEM, PCP Other Clinician: Referring Tanisha Lutes: Altamese CabalJONES, MAURICE Treating Louanna Vanliew/Extender: Altamese CarolinaOBSON, MICHAEL G Weeks in Treatment: 6 Vital Signs Height(in): 64 Pulse(bpm): 82 Weight(lbs): 138 Blood Pressure 138/62 (mmHg): Body Mass Index(BMI): 24 Temperature(F): 97.7 Respiratory Rate 16 (breaths/min): Photos: [N/A:N/A] Wound Location: Right Lower Leg - Anterior N/A N/A Wounding Event: Trauma N/A N/A Primary Etiology: Trauma, Other N/A N/A Comorbid History: Cataracts, Arrhythmia, N/A N/A Congestive Heart Failure, Hypertension, Osteoarthritis Date Acquired: 01/28/2017 N/A N/A Weeks of Treatment: 6  N/A N/A Wound Status: Open N/A N/A Measurements L x W x D 2x1.3x0.4 N/A N/A (cm) Area (cm) : 2.042 N/A N/A Volume (cm) : 0.817 N/A N/A % Reduction in Area: 79.20% N/A N/A % Reduction in Volume: 97.20% N/A N/A Position 1 (o'clock): 11 Maximum Distance 1 3.8 (cm): Starting Position 1 7 (o'clock): Ending Position 1 11 (o'clock): Maximum Distance 1 1.2 (cm): Guinyard, Cheri ([id number]) Tunneling: Yes N/A N/A Undermining: Yes N/A N/A Classification: Full Thickness With N/A N/A Exposed Support Structures Exudate Amount: Large N/A N/A Exudate Type: Serous N/A N/A Exudate Color: amber N/A N/A Wound Margin: Epibole N/A N/A Granulation Amount: Large (67-100%) N/A N/A Granulation Quality: Red, Hyper-granulation N/A N/A Necrotic Amount: Small (1-33%) N/A N/A Exposed Structures: Fat Layer (Subcutaneous N/A N/A Tissue) Exposed: Yes Fascia: No Tendon: No Muscle: No Joint: No Bone: No Periwound Skin Texture: Induration: Yes N/A N/A Periwound Skin No Abnormalities Noted N/A N/A Moisture: Periwound Skin Color: No Abnormalities Noted N/A N/A Temperature: No Abnormality N/A N/A Tenderness on No N/A N/A Palpation: Wound Preparation: Ulcer  Cleansing: N/A N/A Rinsed/Irrigated with Saline Topical Anesthetic Applied: None Treatment Notes Electronic Signature(s) Signed: 04/06/2017 5:36:29 PM By: Elliot Gurney, BSN, RN, CWS, Kim RN, BSN Entered By: Elliot Gurney, BSN, RN, CWS, Kim on 04/06/2017 15:10:40 Winchester, Randee ([id number]) -------------------------------------------------------------------------------- Multi-Disciplinary Care Plan Details Patient Name: [name] Collum Date of Service: 04/06/2017 3:00 PM Medical Record Number: [id number] Patient Account Number: [id number] Date of Birth/Sex: 1929/06/28 (83 y.o. Female) Treating RN: Huel Coventry Primary Care Millianna Szymborski: SYSTEM, PCP Other Clinician: Referring Nalina Yeatman: Altamese Cabal Treating Miche Loughridge/Extender: Altamese Oxbow in Treatment: 6 Active Inactive ` Abuse / Safety / Falls / Self Care Management Nursing Diagnoses: History of Falls Impaired home maintenance Impaired physical mobility Potential for falls Potential for injury related to transfers Goals: Patient will not develop complications from immobility Date Initiated: 02/23/2017 Target Resolution Date: 07/26/2017 Goal Status: Active Patient will not experience any injury related to falls Date Initiated: 02/23/2017 Target Resolution Date: 07/26/2017 Goal Status: Active Patient will remain injury free related to falls Date Initiated: 02/23/2017 Target Resolution Date: 07/26/2017 Goal Status: Active Patient/caregiver will demonstrate safe use of adaptive devices to increase mobility Date Initiated: 02/23/2017 Target Resolution Date: 07/26/2017 Goal Status: Active Patient/caregiver will identify factors that restrict self-care and home management Date Initiated: 02/23/2017 Target Resolution Date: 07/26/2017 Goal Status: Active Patient/caregiver will verbalize understanding of skin care regimen Date Initiated: 02/23/2017 Target Resolution Date: 07/26/2017 Goal Status: Active Patient/caregiver will verbalize  understanding of the importance to maintain current immunizations/vaccinations Date Initiated: 02/23/2017 Target Resolution Date: 07/26/2017 Goal Status: Active Patient/caregiver will verbalize/demonstrate measure taken to improve self care Date Initiated: 02/23/2017 Target Resolution Date: 07/26/2017 TREMEKA, HELBLING ([id number]) Goal Status: Active Patient/caregiver will verbalize/demonstrate measures taken to improve the patient's personal safety Date Initiated: 02/23/2017 Target Resolution Date: 07/26/2017 Goal Status: Active Patient/caregiver will verbalize/demonstrate measures taken to prevent injury and/or falls Date Initiated: 02/23/2017 Target Resolution Date: 07/26/2017 Goal Status: Active Patient/caregiver will verbalize/demonstrate understanding of what to do in case of emergency Date Initiated: 02/23/2017 Target Resolution Date: 07/26/2017 Goal Status: Active Interventions: Call light and/or bell within patient's reach Assess Activities of Daily Living upon admission and as needed Assess fall risk on admission and as needed Assess: immobility, friction, shearing, incontinence upon admission and as needed Assess impairment of mobility on admission and as needed per policy Assess personal safety and home safety (as indicated) on admission and as needed Assess self care needs on admission and as needed Provide education  on basic hygiene Provide education on fall prevention Provide education on personal and home safety Provide education on safe transfers Treatment Activities: Education provided on Basic Hygiene : 02/23/2017 Notes: ` Orientation to the Wound Care Program Nursing Diagnoses: Knowledge deficit related to the wound healing center program Goals: Patient/caregiver will verbalize understanding of the Wound Healing Center Program Date Initiated: 02/23/2017 Target Resolution Date: 07/26/2017 Goal Status: Active Interventions: Provide education on orientation to the  wound center Notes: Abigial Newville, Viha ([id number]) Wound/Skin Impairment Nursing Diagnoses: Impaired tissue integrity Knowledge deficit related to ulceration/compromised skin integrity Goals: Patient/caregiver will verbalize understanding of skin care regimen Date Initiated: 02/23/2017 Target Resolution Date: 07/26/2017 Goal Status: Active Ulcer/skin breakdown will have a volume reduction of 30% by week 4 Date Initiated: 02/23/2017 Target Resolution Date: 07/26/2017 Goal Status: Active Ulcer/skin breakdown will have a volume reduction of 50% by week 8 Date Initiated: 02/23/2017 Target Resolution Date: 07/26/2017 Goal Status: Active Ulcer/skin breakdown will have a volume reduction of 80% by week 12 Date Initiated: 02/23/2017 Target Resolution Date: 07/26/2017 Goal Status: Active Ulcer/skin breakdown will heal within 14 weeks Date Initiated: 02/23/2017 Target Resolution Date: 07/26/2017 Goal Status: Active Interventions: Assess patient/caregiver ability to obtain necessary supplies Assess patient/caregiver ability to perform ulcer/skin care regimen upon admission and as needed Assess ulceration(s) every visit Provide education on ulcer and skin care Treatment Activities: Referred to DME Shreeya Recendiz for dressing supplies : 02/23/2017 Skin care regimen initiated : 02/23/2017 Topical wound management initiated : 02/23/2017 Notes: Electronic Signature(s) Signed: 04/06/2017 5:36:29 PM By: Elliot Gurney, BSN, RN, CWS, Kim RN, BSN Entered By: Elliot Gurney, BSN, RN, CWS, Kim on 04/06/2017 15:10:30 Gore, Brittnay ([id number]) -------------------------------------------------------------------------------- Pain Assessment Details Patient Name: [name] Collum Date of Service: 04/06/2017 3:00 PM Medical Record Number: [id number] Patient Account Number: [id number] Date of Birth/Sex: September 02, 1929 (83 y.o. Female) Treating RN: Huel Coventry Primary Care Deliliah Spranger: SYSTEM, PCP Other Clinician: Referring Karder Goodin: Altamese Cabal Treating Rudell Marlowe/Extender: Altamese Schneider in Treatment: 6 Active Problems Location of Pain Severity and Description of Pain Patient Has Paino No Site Locations With Dressing Change: No Pain Management and Medication Current Pain Management: Goals for Pain Management Topical or injectable lidocaine is offered to patient for acute pain when surgical debridement is performed. If needed, Patient is instructed to use over the counter pain medication for the following 24-48 hours after debridement. Wound care MDs do not prescribed pain medications. Patient has chronic pain or uncontrolled pain. Patient has been instructed to make an appointment with their Primary Care Physician for pain management. Electronic Signature(s) Signed: 04/06/2017 5:36:29 PM By: Elliot Gurney, BSN, RN, CWS, Kim RN, BSN Entered By: Elliot Gurney, BSN, RN, CWS, Kim on 04/06/2017 15:00:44 Avon, Fernande ([id number]) -------------------------------------------------------------------------------- Patient/Caregiver Education Details Patient Name: [name] Collum Date of Service: 04/06/2017 3:00 PM Medical Record Number: [id number] Patient Account Number: [id number] Date of Birth/Gender: [birth year] (83 y.o. Female) Treating RN: Huel Coventry Primary Care Physician: SYSTEM, PCP Other Clinician: Referring Physician: Altamese Cabal Treating Physician/Extender: Skeet Simmer in Treatment: 6 Education Assessment Education Provided To: Patient Education Topics Provided Safety: Handouts: Personal Safety Methods: Explain/Verbal Responses: State content correctly Venous: Handouts: Controlling Swelling with Compression Stockings Methods: Demonstration Responses: State content correctly Electronic Signature(s) Signed: 04/06/2017 5:36:29 PM By: Elliot Gurney, BSN, RN, CWS, Kim RN, BSN Entered By: Elliot Gurney, BSN, RN, CWS, Kim on 04/06/2017 15:58:51 Selinda Orion, Cassity  ([id number]) -------------------------------------------------------------------------------- Wound Assessment Details Patient Name: [name] Collum Date of Service: 04/06/2017 3:00 PM Medical Record Number: [id number] Patient Account Number: [id number] Date of Birth/Sex: 02/17/1929 (  83 y.o. Female) Treating RN: Huel Coventry Primary Care Zakhai Meisinger: SYSTEM, PCP Other Clinician: Referring [name] Bartoli: Altamese Cabal Treating Nikolus Marczak/Extender: Maxwell Caul Weeks in Treatment: 6 Wound Status Wound Number: 1 Primary Trauma, Other Etiology: Wound Location: Right Lower Leg - Anterior Wound Open Wounding Event: Trauma Status: Date Acquired: 01/28/2017 Comorbid Cataracts, Arrhythmia, Congestive Weeks Of Treatment: 6 History: Heart Failure, Hypertension, Clustered Wound: No Osteoarthritis Photos Wound Measurements Length: (cm) 2 Width: (cm) 1.3 Depth: (cm) 0.4 Area: (cm) 2.042 Volume: (cm) 0.817 % Reduction in Area: 79.2% % Reduction in Volume: 97.2% Tunneling: Yes Position (o'clock): 11 Maximum Distance: (cm) 3.8 Undermining: Yes Starting Position (o'clock): 7 Ending Position (o'clock): 11 Maximum Distance: (cm) 1.2 Wound Description Full Thickness With Exposed Classification: Support Structures Wound Margin: Epibole Exudate Large Amount: Exudate Type: Serous Exudate Color: amber Dykman, Neta ([id number]) Foul Odor After Cleansing: No Slough/Fibrino Yes Wound Bed Granulation Amount: Large (67-100%) Exposed Structure Granulation Quality: Red, Hyper-granulation Fascia Exposed: No Necrotic Amount: Small (1-33%) Fat Layer (Subcutaneous Tissue) Exposed: Yes Necrotic Quality: Adherent Slough Tendon Exposed: No Muscle Exposed: No Joint Exposed: No Bone Exposed: No Periwound Skin Texture Texture Color No Abnormalities Noted: No No Abnormalities Noted: No Induration: Yes Temperature / Pain Moisture Temperature: No Abnormality No Abnormalities Noted: No Wound  Preparation Ulcer Cleansing: Rinsed/Irrigated with Saline Topical Anesthetic Applied: None Treatment Notes Wound #1 (Right, Anterior Lower Leg) 1. Cleansed with: Clean wound with Normal Saline 2. Anesthetic Topical Lidocaine 4% cream to wound bed prior to debridement 4. Dressing Applied: Prisma Ag 5. Secondary Dressing Applied ABD Pad 7. Secured with 3 Layer Compression System - Right Lower Extremity Notes Kerra max Electronic Signature(s) Signed: 04/06/2017 5:36:29 PM By: Elliot Gurney, BSN, RN, CWS, Kim RN, BSN Entered By: Elliot Gurney, BSN, RN, CWS, Kim on 04/06/2017 15:08:17 Starks, Brookelle ([id number]) -------------------------------------------------------------------------------- Vitals Details Patient Name: [name] Collum Date of Service: 04/06/2017 3:00 PM Medical Record Number: [id number] Patient Account Number: [id number] Date of Birth/Sex: [birth year] (83 y.o. Female) Treating RN: Huel Coventry Primary Care Ashle Stief: SYSTEM, PCP Other Clinician: Referring Cheryllynn Sarff: Altamese Cabal Treating Kana Reimann/Extender: Altamese Adams in Treatment: 6 Vital Signs Time Taken: 15:00 Temperature (F): 97.7 Height (in): 64 Pulse (bpm): 82 Weight (lbs): 138 Respiratory Rate (breaths/min): 16 Body Mass Index (BMI): 23.7 Blood Pressure (mmHg): 138/62 Reference Range: 80 - 120 mg / dl Electronic Signature(s) Signed: 04/06/2017 5:36:29 PM By: Elliot Gurney, BSN, RN, CWS, Kim RN, BSN Entered By: Elliot Gurney, BSN, RN, CWS, Kim on 04/06/2017 15:02:37

## 2017-04-08 NOTE — Progress Notes (Signed)
[name], Mccullough ([id number]) Visit Report for 04/06/2017 Chief Complaint Document Details Patient Name: [name], [name] Date of Service: 04/06/2017 3:00 PM Medical Record Number: [id number] Patient Account Number: [id number] Date of Birth/Sex: 22-Nov-1928 (83 y.o. Female) Treating RN: Huel Coventry Primary Care Provider: SYSTEM, PCP Other Clinician: Referring Provider: Altamese Cabal Treating Provider/Extender: Linwood Dibbles, Emori Kamau Weeks in Treatment: 6 Information Obtained from: Patient Chief Complaint 02/23/17; patient is here for review of wound on the right anterior lower leg Electronic Signature(s) Signed: 04/06/2017 7:35:55 PM By: Lenda Kelp PA-C Entered By: Lenda Kelp on 04/06/2017 15:33:16 Croom, Jairy ([id number]) -------------------------------------------------------------------------------- HPI Details Patient Name: [name] Date of Service: 04/06/2017 3:00 PM Medical Record Number: [id number] Patient Account Number: [id number] Date of Birth/Sex: Jan 01, 1929 (83 y.o. Female) Treating RN: Huel Coventry Primary Care Provider: SYSTEM, PCP Other Clinician: Referring Provider: Altamese Cabal Treating Provider/Extender: Linwood Dibbles, Mattingly Fountaine Weeks in Treatment: 6 History of Present Illness HPI Description: 02/23/17 this is an 83 year old woman who is at a fitness club about to sit down on a rowing machine. She fell and traumatized the anterior part of her right leg on a sharp metal surface. This caused immediate pain and bleeding. She stopped off on her way home at an urgent care ultimately was referred to the ER and by that time she had a significant hematoma of her right leg. She was followed in the urgent orthopedic clinic. She had a duplex ultrasound of the right leg on 5/10 that was negative for DVT x-ray of the right leg on 01/28/17 was negative including a CT scan of the tib-fib. The CT scan did show the subcutaneous hematoma which on 01/28/17 measured 7.7 cm craniocaudal by 2.5 cm x 5.6 cm  AP subcutaneous air was also identified felt likely secondary to small lacerations. There was no acute fracture. The patient has been continuing mostly with topical antibiotics on this wound. She did not have any compression. She is referred here from the urgent orthopedic clinic for review of this. She did not have a wound history she is not a diabetic. ABI in this clinic on the right was 1.1 on the left 1.0. She is on aspirin as her only anti-coagulant 03/02/17; the patient arrives for review of a traumatic wound on her right anterior leg with subsequent hematoma formation and considerable tissue loss on the right anterior lateral leg. We'll use silver alginate last week. We kept her under compression and the patient states that her pain was actually a lot better. Wound measures at 5 x 2.5 x 2. Consider home amount of undermining laterally by roughly 3.5 cm from 7 to 12:00 03/09/17; wound actually looks better however there is considerable undermining from roughly 7 to 12:00. No evidence of surrounding infection. A snap VAC was applied today 03/17/17; the patient continues to do quite well. There have been some logistic problems with the wound VAC however we have managed to keep this in place. She did fill out one of the canisters but didn't come in in follow-up. We've been using silver collagen under a snap VAC 03/23/17; on intake today the patient appeared to have a new tunnel superiorly in the wound by 4 cm. This is separated from the undermining from roughly 7 or 11:00 she has had chronically. This undermines at 1.8 cm. We've been using silver collagen under a snapVAC 03/26/2017 -- patient of Dr. Leanord Hawking who came in to see me today because she has developed swelling and redness over the area where the snap vacuum was  applied and has been having significant discomfort. Culture taken last week is not yet in 04/06/17 on evaluation today patient's wound appears to be doing well with an excellent  granulation bed. Unfortunately she does have some swelling in the immediate the sanity of the wound which I fear may be causing some delayed healing. Fortunately there does not appear to be any evidence of infection. Electronic Signature(s) Signed: 04/06/2017 7:35:55 PM By: Lenda KelpStone III, Bookert Guzzi PA-C Entered By: Lenda KelpStone III, Bryelle Spiewak on 04/06/2017 15:47:18 [place], [place] ([id number]) -------------------------------------------------------------------------------- Physical Exam Details Patient Name: [name] CollumBERMAN, Leiana Date of Service: 04/06/2017 3:00 PM Medical Record Number: [id number] Patient Account Number: [id number] Date of Birth/Sex: 03/30/1929 (83 y.o. Female) Treating RN: Huel CoventryWoody, Kim Primary Care Provider: SYSTEM, PCP Other Clinician: Referring Provider: Altamese CabalJONES, MAURICE Treating Provider/Extender: STONE III, Ellarie Picking Weeks in Treatment: 6 Constitutional Well-nourished and well-hydrated in no acute distress. Respiratory normal breathing without difficulty. clear to auscultation bilaterally. Cardiovascular regular rate and rhythm with normal S1, S2. 2+ dorsalis pedis/posterior tibialis pulses. 1+ pitting edema of the right lower extremity. Psychiatric this patient is able to make decisions and demonstrates good insight into disease process. Alert and Oriented x 3. pleasant and cooperative. Notes Patient has swelling especially in the 12 o'clock location which he had undermining as well. She has not been utilizing compression wraps which I think you benefit for her. She has continued to tolerate her current dressings. Electronic Signature(s) Signed: 04/06/2017 7:35:55 PM By: Lenda KelpStone III, Samy Ryner PA-C Entered By: Lenda KelpStone III, Shoni Quijas on 04/06/2017 15:48:44 [place], Jenniferann ([id number]) -------------------------------------------------------------------------------- Physician Orders Details Patient Name: [name] CollumBERMAN, Carsen Date of Service: 04/06/2017 3:00 PM Medical Record Number: [id number] Patient Account Number:  [id number] Date of Birth/Sex: 07/09/1929 (83 y.o. Female) Treating RN: Huel CoventryWoody, Kim Primary Care Provider: SYSTEM, PCP Other Clinician: Referring Provider: Altamese CabalJONES, MAURICE Treating Provider/Extender: Linwood DibblesSTONE III, Jazier Mcglamery Weeks in Treatment: 6 Verbal / Phone Orders: No Diagnosis Coding ICD-10 Coding Code Description S81.811D Laceration without foreign body, right lower leg, subsequent encounter I87.321 Chronic venous hypertension (idiopathic) with inflammation of right lower extremity Wound Cleansing Wound #1 Right,Anterior Lower Leg o Cleanse wound with mild soap and water o No tub bath. Anesthetic Wound #1 Right,Anterior Lower Leg o Topical Lidocaine 4% cream applied to wound bed prior to debridement Primary Wound Dressing Wound #1 Right,Anterior Lower Leg o Prisma Ag Secondary Dressing Wound #1 Right,Anterior Lower Leg o Other - Kerra Max Care o ABD pad Dressing Change Frequency Wound #1 Right,Anterior Lower Leg o Change dressing every week Follow-up Appointments Wound #1 Right,Anterior Lower Leg o Return Appointment in 1 week. o Nurse Visit as needed Edema Control o 3 Layer Compression System - Right Lower Extremity - Zinc dome paste to anchor o Elevate legs to the level of the heart and pump ankles as often as possible Demuro, Lore ([id number]) Additional Orders / Instructions Wound #1 Right,Anterior Lower Leg o Increase protein intake. o Activity as tolerated Electronic Signature(s) Signed: 04/06/2017 5:36:29 PM By: Elliot GurneyWoody, BSN, RN, CWS, Kim RN, BSN Signed: 04/06/2017 7:35:55 PM By: Lenda KelpStone III, Braycen Burandt PA-C Entered By: Elliot GurneyWoody, BSN, RN, CWS, Kim on 04/06/2017 15:56:23 [place], Mame ([id number]) -------------------------------------------------------------------------------- Problem List Details Patient Name: [name] CollumBERMAN, Montez Date of Service: 04/06/2017 3:00 PM Medical Record Number: [id number] Patient Account Number: [id number] Date of Birth/Sex: 04/16/1929 (83  y.o. Female) Treating RN: Huel CoventryWoody, Kim Primary Care Provider: SYSTEM, PCP Other Clinician: Referring Provider: Altamese CabalJONES, MAURICE Treating Provider/Extender: Linwood DibblesSTONE III, Demetre Monaco Weeks in Treatment: 6 Active Problems ICD-10 Encounter Code Description Active Date Diagnosis S81.811D Laceration without foreign  body, right lower leg, 02/23/2017 Yes subsequent encounter I87.321 Chronic venous hypertension (idiopathic) with 02/23/2017 Yes inflammation of right lower extremity Inactive Problems Resolved Problems Electronic Signature(s) Signed: 04/06/2017 7:35:55 PM By: Lenda Kelp PA-C Entered By: Lenda Kelp on 04/06/2017 15:33:03 Sparano, Tabithia ([id number]) -------------------------------------------------------------------------------- Progress Note Details Patient Name: [name] Date of Service: 04/06/2017 3:00 PM Medical Record Number: [id number] Patient Account Number: [id number] Date of Birth/Sex: [birth year] (83 y.o. Female) Treating RN: Huel Coventry Primary Care Provider: SYSTEM, PCP Other Clinician: Referring Provider: Altamese Cabal Treating Provider/Extender: Linwood Dibbles, Jizelle Conkey Weeks in Treatment: 6 Subjective Chief Complaint Information obtained from Patient 02/23/17; patient is here for review of wound on the right anterior lower leg History of Present Illness (HPI) 02/23/17 this is an 83 year old woman who is at a fitness club about to sit down on a rowing machine. She fell and traumatized the anterior part of her right leg on a sharp metal surface. This caused immediate pain and bleeding. She stopped off on her way home at an urgent care ultimately was referred to the ER and by that time she had a significant hematoma of her right leg. She was followed in the urgent orthopedic clinic. She had a duplex ultrasound of the right leg on 5/10 that was negative for DVT x-ray of the right leg on 01/28/17 was negative including a CT scan of the tib-fib. The CT scan did show the subcutaneous  hematoma which on 01/28/17 measured 7.7 cm craniocaudal by 2.5 cm x 5.6 cm AP subcutaneous air was also identified felt likely secondary to small lacerations. There was no acute fracture. The patient has been continuing mostly with topical antibiotics on this wound. She did not have any compression. She is referred here from the urgent orthopedic clinic for review of this. She did not have a wound history she is not a diabetic. ABI in this clinic on the right was 1.1 on the left 1.0. She is on aspirin as her only anti-coagulant 03/02/17; the patient arrives for review of a traumatic wound on her right anterior leg with subsequent hematoma formation and considerable tissue loss on the right anterior lateral leg. We'll use silver alginate last week. We kept her under compression and the patient states that her pain was actually a lot better. Wound measures at 5 x 2.5 x 2. Consider home amount of undermining laterally by roughly 3.5 cm from 7 to 12:00 03/09/17; wound actually looks better however there is considerable undermining from roughly 7 to 12:00. No evidence of surrounding infection. A snap VAC was applied today 03/17/17; the patient continues to do quite well. There have been some logistic problems with the wound VAC however we have managed to keep this in place. She did fill out one of the canisters but didn't come in in follow-up. We've been using silver collagen under a snap VAC 03/23/17; on intake today the patient appeared to have a new tunnel superiorly in the wound by 4 cm. This is separated from the undermining from roughly 7 or 11:00 she has had chronically. This undermines at 1.8 cm. We've been using silver collagen under a snapVAC 03/26/2017 -- patient of Dr. Leanord Hawking who came in to see me today because she has developed swelling and redness over the area where the snap vacuum was applied and has been having significant discomfort. Culture taken last week is not yet in 04/06/17 on  evaluation today patient's wound appears to be doing well with an excellent granulation bed. Unfortunately she  does have some swelling in the immediate the sanity of the wound which I fear may be causing some delayed healing. Fortunately there does not appear to be any evidence of infection. [facility], Julita ([id number]) Objective Constitutional Well-nourished and well-hydrated in no acute distress. Vitals Time Taken: 3:00 PM, Height: 64 in, Weight: 138 lbs, BMI: 23.7, Temperature: 97.7 F, Pulse: 82 bpm, Respiratory Rate: 16 breaths/min, Blood Pressure: 138/62 mmHg. Respiratory normal breathing without difficulty. clear to auscultation bilaterally. Cardiovascular regular rate and rhythm with normal S1, S2. 2+ dorsalis pedis/posterior tibialis pulses. 1+ pitting edema of the right lower extremity. Psychiatric this patient is able to make decisions and demonstrates good insight into disease process. Alert and Oriented x 3. pleasant and cooperative. General Notes: Patient has swelling especially in the 12 o'clock location which he had undermining as well. She has not been utilizing compression wraps which I think you benefit for her. She has continued to tolerate her current dressings. Integumentary (Hair, Skin) Wound #1 status is Open. Original cause of wound was Trauma. The wound is located on the Right,Anterior Lower Leg. The wound measures 2cm length x 1.3cm width x 0.4cm depth; 2.042cm^2 area and 0.817cm^3 volume. There is Fat Layer (Subcutaneous Tissue) Exposed exposed. Tunneling has been noted at 11:00 with a maximum distance of 3.8cm. Undermining begins at 7:00 and ends at 11:00 with a maximum distance of 1.2cm. There is a large amount of serous drainage noted. The wound margin is epibole. There is large (67-100%) red granulation within the wound bed. There is a small (1-33%) amount of necrotic tissue within the wound bed including Adherent Slough. The periwound skin appearance  exhibited: Induration. Periwound temperature was noted as No Abnormality. Assessment Active Problems ICD-10 STAIB, Taziah ([id number]) S81.811D - Laceration without foreign body, right lower leg, subsequent encounter I87.321 - Chronic venous hypertension (idiopathic) with inflammation of right lower extremity Plan Wound Cleansing: Wound #1 Right,Anterior Lower Leg: Cleanse wound with mild soap and water No tub bath. Anesthetic: Wound #1 Right,Anterior Lower Leg: Topical Lidocaine 4% cream applied to wound bed prior to debridement Primary Wound Dressing: Wound #1 Right,Anterior Lower Leg: Prisma Ag Secondary Dressing: Wound #1 Right,Anterior Lower Leg: Other Rachael Fee Max Care ABD pad Dressing Change Frequency: Wound #1 Right,Anterior Lower Leg: Change dressing every week Follow-up Appointments: Wound #1 Right,Anterior Lower Leg: Return Appointment in 1 week. Nurse Visit as needed Edema Control: 3 Layer Compression System - Right Lower Extremity - Zinc dome paste to anchor Elevate legs to the level of the heart and pump ankles as often as possible Additional Orders / Instructions: Wound #1 Right,Anterior Lower Leg: Increase protein intake. Activity as tolerated I am going to recommend that we initiate a four layer compression wrap which she has previously tolerated prior to the application of the SNAP VAC. We will see how things do over the next week. Underneath the compression wrapping we are going to place Prisma in the region where there is undermining and then in the dressing overlying this. Otherwise we will continue with the current wound care orders per the above. Hardtner, Janit ([id number]) Electronic Signature(s) Signed: 04/06/2017 7:35:55 PM By: Lenda Kelp PA-C Entered By: Lenda Kelp on 04/06/2017 16:47:14 Damico, Erma ([id number]) -------------------------------------------------------------------------------- SuperBill Details Patient Name: [name] Date of Service: 04/06/2017 Medical Record Number: [id number] Patient Account Number: [id number] Date of Birth/Sex: 24-Apr-1929 (83 y.o. Female) Treating RN: Huel Coventry Primary Care Provider: SYSTEM, PCP Other Clinician: Referring Provider: Altamese Cabal Treating Provider/Extender: Linwood Dibbles, Gerard Cantara Weeks  in Treatment: 6 Diagnosis Coding ICD-10 Codes Code Description S81.811D Laceration without foreign body, right lower leg, subsequent encounter I87.321 Chronic venous hypertension (idiopathic) with inflammation of right lower extremity Facility Procedures CPT4: Description Modifier Quantity Code [id number] (Facility Use Only) [phone] - APPLY MULTLAY COMPRS LWR RT 1 LEG Physician Procedures CPT4: Description Modifier Quantity Code [id number] 99213 - WC PHYS LEVEL 3 - EST PT 1 ICD-10 Description Diagnosis S81.811D Laceration without foreign body, right lower leg, subsequent encounter I87.321 Chronic venous hypertension (idiopathic) with  inflammation of right lower extremity Electronic Signature(s) Signed: 04/06/2017 7:35:55 PM By: Lenda Kelp PA-C Entered By: Lenda Kelp on 04/06/2017 16:48:21

## 2017-04-13 ENCOUNTER — Encounter: Payer: Medicare Other | Admitting: Internal Medicine

## 2017-04-13 ENCOUNTER — Ambulatory Visit: Payer: Medicare Other | Admitting: Internal Medicine

## 2017-04-13 DIAGNOSIS — S81811D Laceration without foreign body, right lower leg, subsequent encounter: Secondary | ICD-10-CM | POA: Diagnosis not present

## 2017-04-15 NOTE — Progress Notes (Signed)
[name], Nadene ([id number]) Visit Report for 04/13/2017 Arrival Information Details Patient Name: [name], [name] Date of Service: 04/13/2017 3:00 PM Medical Record Number: [id number] Patient Account Number: [id number] Date of Birth/Sex: 09/05/1929 (83 y.o. Female) Treating RN: Afful, RN, BSN, [employer] Primary Care Jakeob Tullis: SYSTEM, PCP Other Clinician: Referring Kellie Chisolm: Altamese CabalJONES, MAURICE Treating Axcel Horsch/Extender: Altamese CarolinaOBSON, MICHAEL G Weeks in Treatment: 7 Visit Information History Since Last Visit All ordered tests and consults were completed: No Patient Arrived: Gilmer MorCane Added or deleted any medications: No Arrival Time: 15:08 Any new allergies or adverse reactions: No Accompanied By: dtr Had a fall or experienced change in No Transfer Assistance: None activities of daily living that may affect Patient Identification Verified: Yes risk of falls: Secondary Verification Process Completed: Yes Signs or symptoms of abuse/neglect since last No Patient Requires Transmission-Based No visito Precautions: Hospitalized since last visit: No Patient Has Alerts: No Has Dressing in Place as Prescribed: Yes Pain Present Now: No Electronic Signature(s) Signed: 04/14/2017 6:10:04 PM By: Elpidio EricAfful, Azaylea BSN, RN Entered By: Elpidio EricAfful, Sharnise on 04/13/2017 15:08:48 Bezanson, Nakeda ([id number]) -------------------------------------------------------------------------------- Encounter Discharge Information Details Patient Name: [name], [name] Date of Service: 04/13/2017 3:00 PM Medical Record Number: [id number] Patient Account Number: [id number] Date of Birth/Sex: 10/23/1928 (83 y.o. Female) Treating RN: Afful, RN, BSN, [employer] Primary Care Ayva Veilleux: SYSTEM, PCP Other Clinician: Referring Zael Shuman: Altamese CabalJONES, MAURICE Treating Ladainian Therien/Extender: Altamese CarolinaOBSON, MICHAEL G Weeks in Treatment: 7 Encounter Discharge Information Items Discharge Pain Level: 0 Discharge Condition: Stable Ambulatory Status: Cane Discharge Destination:  Home Transportation: Private Auto Accompanied By: dtr Schedule Follow-up Appointment: No Medication Reconciliation completed No and provided to Patient/Care Kaemon Barnett: Provided on Clinical Summary of Care: 04/13/2017 Form Type Recipient Paper Patient RB Electronic Signature(s) Signed: 04/13/2017 4:08:44 PM By: Gwenlyn PerkingMoore, Shelia Entered By: Gwenlyn PerkingMoore, Shelia on 04/13/2017 16:08:43 Housholder, Kimba ([id number]) -------------------------------------------------------------------------------- Lower Extremity Assessment Details Patient Name: [name], [name] Date of Service: 04/13/2017 3:00 PM Medical Record Number: [id number] Patient Account Number: [id number] Date of Birth/Sex: 10/16/1928 (83 y.o. Female) Treating RN: Afful, RN, BSN, [employer] Primary Care Mollee Neer: SYSTEM, PCP Other Clinician: Referring Marlene Pfluger: Altamese CabalJONES, MAURICE Treating Andre Swander/Extender: Altamese CarolinaOBSON, MICHAEL G Weeks in Treatment: 7 Edema Assessment Assessed: [Left: No] [Right: No] E[Left: dema] [Right: :] Calf Left: Right: Point of Measurement: 28 cm From Medial Instep cm 35.4 cm Ankle Left: Right: Point of Measurement: 8 cm From Medial Instep cm 21 cm Vascular Assessment Claudication: Claudication Assessment [Right:None] Pulses: Dorsalis Pedis Palpable: [Right:Yes] Posterior Tibial Extremity colors, hair growth, and conditions: Extremity Color: [Right:Mottled] Hair Growth on Extremity: [Right:No] Temperature of Extremity: [Right:Warm] Capillary Refill: [Right:< 3 seconds] Toe Nail Assessment Left: Right: Thick: No Discolored: No Deformed: No Improper Length and Hygiene: No Electronic Signature(s) Signed: 04/13/2017 3:34:31 PM By: Elpidio EricAfful, Daiana BSN, RN Entered By: Elpidio EricAfful, Kaelea on 04/13/2017 15:34:30 Lasorsa, Anushree ([id number]) [name], Bowen ([id number]) -------------------------------------------------------------------------------- Multi Wound Chart Details Patient Name: [name], [name] Date of Service: 04/13/2017 3:00 PM Medical  Record Number: [id number] Patient Account Number: [id number] Date of Birth/Sex: 10/12/1928 (83 y.o. Female) Treating RN: Afful, RN, BSN, [employer] Primary Care Crimson Beer: SYSTEM, PCP Other Clinician: Referring Tymara Saur: Altamese CabalJONES, MAURICE Treating Desiree Fleming/Extender: Altamese CarolinaOBSON, MICHAEL G Weeks in Treatment: 7 Vital Signs Height(in): 64 Pulse(bpm): 79 Weight(lbs): 138 Blood Pressure 163/65 (mmHg): Body Mass Index(BMI): 24 Temperature(F): 97.7 Respiratory Rate 16 (breaths/min): Photos: [N/A:N/A] Wound Location: Right Lower Leg - Anterior N/A N/A Wounding Event: Trauma N/A N/A Primary Etiology: Trauma, Other N/A N/A Comorbid History: Cataracts, Arrhythmia, N/A N/A Congestive Heart Failure, Hypertension, Osteoarthritis Date Acquired: 01/28/2017 N/A N/A Weeks of Treatment: 7 N/A  N/A Wound Status: Open N/A N/A Measurements L x W x D 3.3x1.5x0.4 N/A N/A (cm) Area (cm) : 3.888 N/A N/A Volume (cm) : 1.555 N/A N/A % Reduction in Area: 60.40% N/A N/A % Reduction in Volume: 94.70% N/A N/A Classification: Full Thickness With N/A N/A Exposed Support Structures Exudate Amount: Large N/A N/A Exudate Type: Serous N/A N/A Exudate Color: amber N/A N/A Wound Margin: Epibole N/A N/A Deblois, Jeff ([id number]) Granulation Amount: Large (67-100%) N/A N/A Granulation Quality: Red, Hyper-granulation N/A N/A Necrotic Amount: Small (1-33%) N/A N/A Exposed Structures: Fat Layer (Subcutaneous N/A N/A Tissue) Exposed: Yes Fascia: No Tendon: No Muscle: No Joint: No Bone: No Epithelialization: Small (1-33%) N/A N/A Periwound Skin Texture: Excoriation: No N/A N/A Induration: No Callus: No Crepitus: No Rash: No Scarring: No Periwound Skin Maceration: No N/A N/A Moisture: Dry/Scaly: No Periwound Skin Color: Hemosiderin Staining: Yes N/A N/A Mottled: Yes Atrophie Blanche: No Cyanosis: No Ecchymosis: No Erythema: No Pallor: No Rubor: No Temperature: No Abnormality N/A N/A Tenderness on Yes N/A  N/A Palpation: Wound Preparation: Ulcer Cleansing: N/A N/A Rinsed/Irrigated with Saline, Other: soap and water Topical Anesthetic Applied: None, Other: lidocaine 4% Treatment Notes Wound #1 (Right, Anterior Lower Leg) 1. Cleansed with: Cleanse wound with antibacterial soap and water 3. Peri-wound Care: Barrier cream Moisturizing lotion 4. Dressing Applied: Prisma Ag 5. Secondary Dressing Applied Bernardini, Zaynah ([id number]) ABD Pad Dry Gauze 7. Secured with 3 Layer Compression System - Right Lower Extremity Notes Kerra max Electronic Signature(s) Signed: 04/13/2017 5:59:51 PM By: Baltazar Najjar MD Previous Signature: 04/13/2017 3:35:02 PM Version By: Elpidio Eric BSN, RN Entered By: Baltazar Najjar on 04/13/2017 16:58:59 Dobratz, Sarah ([id number]) -------------------------------------------------------------------------------- Multi-Disciplinary Care Plan Details Patient Name: [name] Collum Date of Service: 04/13/2017 3:00 PM Medical Record Number: [id number] Patient Account Number: [id number] Date of Birth/Sex: 1929/03/26 (83 y.o. Female) Treating RN: Afful, RN, BSN, [employer] Primary Care Cherine Drumgoole: SYSTEM, PCP Other Clinician: Referring Malakie Balis: Altamese Cabal Treating Kadejah Sandiford/Extender: Altamese [place] in Treatment: 7 Active Inactive ` Abuse / Safety / Falls / Self Care Management Nursing Diagnoses: History of Falls Impaired home maintenance Impaired physical mobility Potential for falls Potential for injury related to transfers Goals: Patient will not develop complications from immobility Date Initiated: 02/23/2017 Target Resolution Date: 07/26/2017 Goal Status: Active Patient will not experience any injury related to falls Date Initiated: 02/23/2017 Target Resolution Date: 07/26/2017 Goal Status: Active Patient will remain injury free related to falls Date Initiated: 02/23/2017 Target Resolution Date: 07/26/2017 Goal Status: Active Patient/caregiver will  demonstrate safe use of adaptive devices to increase mobility Date Initiated: 02/23/2017 Target Resolution Date: 07/26/2017 Goal Status: Active Patient/caregiver will identify factors that restrict self-care and home management Date Initiated: 02/23/2017 Target Resolution Date: 07/26/2017 Goal Status: Active Patient/caregiver will verbalize understanding of skin care regimen Date Initiated: 02/23/2017 Target Resolution Date: 07/26/2017 Goal Status: Active Patient/caregiver will verbalize understanding of the importance to maintain current immunizations/vaccinations Date Initiated: 02/23/2017 Target Resolution Date: 07/26/2017 Goal Status: Active Patient/caregiver will verbalize/demonstrate measure taken to improve self care Date Initiated: 02/23/2017 Target Resolution Date: 07/26/2017 JOYA, WILLMOTT ([id number]) Goal Status: Active Patient/caregiver will verbalize/demonstrate measures taken to improve the patient's personal safety Date Initiated: 02/23/2017 Target Resolution Date: 07/26/2017 Goal Status: Active Patient/caregiver will verbalize/demonstrate measures taken to prevent injury and/or falls Date Initiated: 02/23/2017 Target Resolution Date: 07/26/2017 Goal Status: Active Patient/caregiver will verbalize/demonstrate understanding of what to do in case of emergency Date Initiated: 02/23/2017 Target Resolution Date: 07/26/2017 Goal Status: Active Interventions: Call light and/or bell within patient's  reach Assess Activities of Daily Living upon admission and as needed Assess fall risk on admission and as needed Assess: immobility, friction, shearing, incontinence upon admission and as needed Assess impairment of mobility on admission and as needed per policy Assess personal safety and home safety (as indicated) on admission and as needed Assess self care needs on admission and as needed Provide education on basic hygiene Provide education on fall prevention Provide education  on personal and home safety Provide education on safe transfers Treatment Activities: Education provided on Basic Hygiene : 03/09/2017 Notes: ` Orientation to the Wound Care Program Nursing Diagnoses: Knowledge deficit related to the wound healing center program Goals: Patient/caregiver will verbalize understanding of the Wound Healing Center Program Date Initiated: 02/23/2017 Target Resolution Date: 07/26/2017 Goal Status: Active Interventions: Provide education on orientation to the wound center Notes: ` PRAK, Arnette ([id number]) Wound/Skin Impairment Nursing Diagnoses: Impaired tissue integrity Knowledge deficit related to ulceration/compromised skin integrity Goals: Patient/caregiver will verbalize understanding of skin care regimen Date Initiated: 02/23/2017 Target Resolution Date: 07/26/2017 Goal Status: Active Ulcer/skin breakdown will have a volume reduction of 30% by week 4 Date Initiated: 02/23/2017 Target Resolution Date: 07/26/2017 Goal Status: Active Ulcer/skin breakdown will have a volume reduction of 50% by week 8 Date Initiated: 02/23/2017 Target Resolution Date: 07/26/2017 Goal Status: Active Ulcer/skin breakdown will have a volume reduction of 80% by week 12 Date Initiated: 02/23/2017 Target Resolution Date: 07/26/2017 Goal Status: Active Ulcer/skin breakdown will heal within 14 weeks Date Initiated: 02/23/2017 Target Resolution Date: 07/26/2017 Goal Status: Active Interventions: Assess patient/caregiver ability to obtain necessary supplies Assess patient/caregiver ability to perform ulcer/skin care regimen upon admission and as needed Assess ulceration(s) every visit Provide education on ulcer and skin care Treatment Activities: Referred to DME Sharice Harriss for dressing supplies : 02/23/2017 Skin care regimen initiated : 02/23/2017 Topical wound management initiated : 02/23/2017 Notes: Electronic Signature(s) Signed: 04/13/2017 3:34:37 PM By: Elpidio Eric BSN,  RN Entered By: Elpidio Eric on 04/13/2017 15:34:36 Shere, Willetta ([id number]) -------------------------------------------------------------------------------- Pain Assessment Details Patient Name: [name] Collum Date of Service: 04/13/2017 3:00 PM Medical Record Number: [id number] Patient Account Number: [id number] Date of Birth/Sex: November 30, 1928 (83 y.o. Female) Treating RN: Afful, RN, BSN, [employer] Primary Care Jeannetta Cerutti: SYSTEM, PCP Other Clinician: Referring Amilcar Reever: Altamese Cabal Treating Maisa Bedingfield/Extender: Altamese [place] in Treatment: 7 Active Problems Location of Pain Severity and Description of Pain Patient Has Paino No Site Locations With Dressing Change: No Pain Management and Medication Current Pain Management: Electronic Signature(s) Signed: 04/14/2017 6:10:04 PM By: Elpidio Eric BSN, RN Entered By: Elpidio Eric on 04/13/2017 15:09:02 Schiller, Cris ([id number]) -------------------------------------------------------------------------------- Patient/Caregiver Education Details Patient Name: [name] Collum Date of Service: 04/13/2017 3:00 PM Medical Record Patient Account Number: [id number] Number: Treating RN: Clover Mealy, RN, BSN, Lataysha Aug 08, 1929 (83 y.o. Other Clinician: Date of Birth/Gender: Female) Treating ROBSON, MICHAEL Primary Care Physician: SYSTEM, PCP Physician/Extender: G Referring Physician: Myrtie Neither in Treatment: 7 Education Assessment Education Provided To: Patient Education Topics Provided Basic Hygiene: Methods: Explain/Verbal Responses: State content correctly Safety: Methods: Explain/Verbal Responses: Reinforcements needed Welcome To The Wound Care Center: Methods: Explain/Verbal Responses: State content correctly Wound/Skin Impairment: Methods: Explain/Verbal Responses: Reinforcements needed Electronic Signature(s) Signed: 04/14/2017 6:10:04 PM By: Elpidio Eric BSN, RN Entered By: Elpidio Eric on 04/13/2017 16:07:58 Raine, Amari  ([id number]) -------------------------------------------------------------------------------- Wound Assessment Details Patient Name: [name] Collum Date of Service: 04/13/2017 3:00 PM Medical Record Number: [id number] Patient Account Number: [id number] Date of Birth/Sex: 1929/08/11 (83 y.o. Female) Treating RN: Afful, RN, BSN, [employer]  Care Bonney Berres: SYSTEM, PCP Other Clinician: Referring Dayden Viverette: Altamese Cabal Treating Aaleyah Witherow/Extender: Maxwell Caul Weeks in Treatment: 7 Wound Status Wound Number: 1 Primary Trauma, Other Etiology: Wound Location: Right Lower Leg - Anterior Wound Open Wounding Event: Trauma Status: Date Acquired: 01/28/2017 Comorbid Cataracts, Arrhythmia, Congestive Weeks Of Treatment: 7 History: Heart Failure, Hypertension, Clustered Wound: No Osteoarthritis Photos Photo Uploaded By: Elpidio Eric on 04/13/2017 15:43:52 Wound Measurements Length: (cm) 3.3 Width: (cm) 1.5 Depth: (cm) 0.4 Area: (cm) 3.888 Volume: (cm) 1.555 % Reduction in Area: 60.4% % Reduction in Volume: 94.7% Epithelialization: Small (1-33%) Tunneling: No Undermining: No Wound Description Full Thickness With Exposed Classification: Support Structures Wound Margin: Epibole Exudate Large Amount: Exudate Type: Serous Exudate Color: amber Foul Odor After Cleansing: No Slough/Fibrino Yes Wound Bed Granulation Amount: Large (67-100%) Exposed Structure Granulation Quality: Red, Hyper-granulation Fascia Exposed: No Redmond, Nickisha ([id number]) Necrotic Amount: Small (1-33%) Fat Layer (Subcutaneous Tissue) Exposed: Yes Necrotic Quality: Adherent Slough Tendon Exposed: No Muscle Exposed: No Joint Exposed: No Bone Exposed: No Periwound Skin Texture Texture Color No Abnormalities Noted: No No Abnormalities Noted: No Callus: No Atrophie Blanche: No Crepitus: No Cyanosis: No Excoriation: No Ecchymosis: No Induration: No Erythema: No Rash: No Hemosiderin Staining:  Yes Scarring: No Mottled: Yes Pallor: No Moisture Rubor: No No Abnormalities Noted: No Dry / Scaly: No Temperature / Pain Maceration: No Temperature: No Abnormality Tenderness on Palpation: Yes Wound Preparation Ulcer Cleansing: Rinsed/Irrigated with Saline, Other: soap and water, Topical Anesthetic Applied: None, Other: lidocaine 4%, Treatment Notes Wound #1 (Right, Anterior Lower Leg) 1. Cleansed with: Cleanse wound with antibacterial soap and water 3. Peri-wound Care: Barrier cream Moisturizing lotion 4. Dressing Applied: Prisma Ag 5. Secondary Dressing Applied ABD Pad Dry Gauze 7. Secured with 3 Layer Compression System - Right Lower Extremity Notes Kerra max Electronic Signature(s) Signed: 04/14/2017 6:10:04 PM By: Elpidio Eric BSN, RN Entered By: Elpidio Eric on 04/13/2017 15:21:26 [place], Shantoria ([id number]) [place], Sandrine ([id number]) -------------------------------------------------------------------------------- Vitals Details Patient Name: [name] Collum Date of Service: 04/13/2017 3:00 PM Medical Record Number: [id number] Patient Account Number: [id number] Date of Birth/Sex: 03/10/1929 (83 y.o. Female) Treating RN: Afful, RN, BSN, [employer] Primary Care Jennett Tarbell: SYSTEM, PCP Other Clinician: Referring Safiyya Stokes: Altamese Cabal Treating Nayely Dingus/Extender: Altamese [place] in Treatment: 7 Vital Signs Time Taken: 15:11 Temperature (F): 97.7 Height (in): 64 Pulse (bpm): 79 Weight (lbs): 138 Respiratory Rate (breaths/min): 16 Body Mass Index (BMI): 23.7 Blood Pressure (mmHg): 163/65 Reference Range: 80 - 120 mg / dl Electronic Signature(s) Signed: 04/14/2017 6:10:04 PM By: Elpidio Eric BSN, RN Entered By: Elpidio Eric on 04/13/2017 15:11:12

## 2017-04-15 NOTE — Progress Notes (Signed)
[name], [name] ([id number]) Visit Report for 04/13/2017 Chief Complaint Document Details Patient Name: [name], [name] Date of Service: 04/13/2017 3:00 PM Medical Record Patient Account Number: [id number] Number: Treating RN: Clover Mealy, RN, BSN, [place] Sink 05/24/1929 (83 y.o. Other Clinician: Date of Birth/Sex: Female) Treating Ferol Laiche Primary Care Provider: SYSTEM, PCP Provider/Extender: G Referring Provider: Myrtie Neither in Treatment: 7 Information Obtained from: Patient Chief Complaint 02/23/17; patient is here for review of wound on the right anterior lower leg Electronic Signature(s) Signed: 04/13/2017 5:59:51 PM By: Baltazar Najjar MD Entered By: Baltazar Najjar on 04/13/2017 16:59:06 [name], [name] ([id number]) -------------------------------------------------------------------------------- HPI Details Patient Name: [name] Date of Service: 04/13/2017 3:00 PM Medical Record Patient Account Number: [id number] Number: Treating RN: Clover Mealy, RN, BSN, [place] 1929/02/03 (83 y.o. Other Clinician: Date of Birth/Sex: Female) Treating Nieve Rojero Primary Care Provider: SYSTEM, PCP Provider/Extender: G Referring Provider: Myrtie Neither in Treatment: 7 History of Present Illness HPI Description: 02/23/17 this is an 83 year old woman who is at a fitness club about to sit down on a rowing machine. She fell and traumatized the anterior part of her right leg on a sharp metal surface. This caused immediate pain and bleeding. She stopped off on her [place] at an urgent care ultimately was referred to the ER and by that time she had a significant hematoma of her right leg. She was followed in the urgent orthopedic clinic. She had a duplex ultrasound of the right leg on 5/10 that was negative for DVT x-ray of the right leg on 01/28/17 was negative including a CT scan of the tib-fib. The CT scan did show the subcutaneous hematoma which on 01/28/17 measured 7.7 cm  craniocaudal by 2.5 cm x 5.6 cm AP subcutaneous air was also identified felt likely secondary to small lacerations. There was no acute fracture. The patient has been continuing mostly with topical antibiotics on this wound. She did not have any compression. She is referred here from the urgent orthopedic clinic for review of this. She did not have a wound history she is not a diabetic. ABI in this clinic on the right was 1.1 on the left 1.0. She is on aspirin as her only anti-coagulant 03/02/17; the patient arrives for review of a traumatic wound on her right anterior leg with subsequent hematoma formation and considerable tissue loss on the right anterior lateral leg. We'll use silver alginate last week. We kept her under compression and the patient states that her pain was actually a lot better. Wound measures at 5 x 2.5 x 2. Consider home amount of undermining laterally by roughly 3.5 cm from 7 to 12:00 03/09/17; wound actually looks better however there is considerable undermining from roughly 7 to 12:00. No evidence of surrounding infection. A snap VAC was applied today 03/17/17; the patient continues to do quite well. There have been some logistic problems with the wound VAC however we have managed to keep this in place. She did fill out one of the canisters but didn't come in in follow-up. We've been using silver collagen under a snap VAC 03/23/17; on intake today the patient appeared to have a new tunnel superiorly in the wound by 4 cm. This is separated from the undermining from roughly 7 or 11:00 she has had chronically. This undermines at 1.8 cm. We've been using silver collagen under a snapVAC 03/26/2017 -- patient of Dr. Leanord Hawking who came in to see me today because she has developed swelling and redness over the area where the snap  vacuum was applied and has been having significant discomfort. Culture taken last week is not yet in 04/06/17 on evaluation today patient's wound appears to be  doing well with an excellent granulation bed. Unfortunately she does have some swelling in the immediate the sanity of the wound which I fear may be causing some delayed healing. Fortunately there does not appear to be any evidence of infection. 04/13/17; the patient's wound is a lot better than when I last saw this 3 weeks ago. Healthy granulation. The tunneling sites of closed over. As noted last week she does have some swelling especially medially. Therefore I agree with continued compression. We have been using silver collagen [name], [name] ([id number]) Electronic Signature(s) Signed: 04/13/2017 5:59:51 PM By: Baltazar Najjar MD Entered By: Baltazar Najjar on 04/13/2017 17:01:30 [name], [name] ([id number]) -------------------------------------------------------------------------------- Physical Exam Details Patient Name: [name] Date of Service: 04/13/2017 3:00 PM Medical Record Patient Account Number: [id number] Number: Treating RN: Clover Mealy, RN, BSN, [place] Sink April 12, 1929 (83 y.o. Other Clinician: Date of Birth/Sex: Female) Treating Devontaye Ground Primary Care Provider: SYSTEM, PCP Provider/Extender: G Referring Provider: Myrtie Neither in Treatment: 7 Constitutional Patient is hypertensive.. Pulse regular and within target range for patient.Marland Kitchen Respirations regular, non-labored and within target range.. Temperature is normal and within the target range for the patient.Marland Kitchen appears in no distress. Eyes Conjunctivae clear. No discharge. Respiratory Respiratory effort is easy and symmetric bilaterally. Rate is normal at rest and on room air.. Cardiovascular Pedal pulses palpable and strong bilaterally.. Edema control is marginal. Lymphatic None palpable in the right popliteal or inguinal area. Psychiatric No evidence of depression, anxiety, or agitation. Calm, cooperative, and communicative. Appropriate interactions and affect.. Notes 04/13/17; the patient's wound bed looks  very healthy. Bright red granulation. There is no surrounding erythema and no crepitus. There is some edema medially and superiorly Electronic Signature(s) Signed: 04/13/2017 5:59:51 PM By: Baltazar Najjar MD Entered By: Baltazar Najjar on 04/13/2017 17:04:42 [name], [name] ([id number]) -------------------------------------------------------------------------------- Physician Orders Details Patient Name: [name] Date of Service: 04/13/2017 3:00 PM Medical Record Patient Account Number: [id number] Number: Treating RN: Clover Mealy, RN, BSN, [place]le[place] [birth year] (83 y.o. Other Clinician: Date of Birth/Sex: Female) Treating Nitya Cauthon Primary Care Provider: SYSTEM, PCP Provider/Extender: G Referring Provider: Myrtie Neither in Treatment: 7 Verbal / Phone Orders: No Diagnosis Coding Wound Cleansing Wound #1 Right,Anterior Lower Leg o Cleanse wound with mild soap and water o No tub bath. Anesthetic Wound #1 Right,Anterior Lower Leg o Topical Lidocaine 4% cream applied to wound bed prior to debridement Primary Wound Dressing Wound #1 Right,Anterior Lower Leg o Prisma Ag Secondary Dressing Wound #1 Right,Anterior Lower Leg o ABD pad Dressing Change Frequency Wound #1 Right,Anterior Lower Leg o Change dressing every week Follow-up Appointments Wound #1 Right,Anterior Lower Leg o Return Appointment in 1 week. o Nurse Visit as needed Edema Control o 3 Layer Compression System - Right Lower Extremity - Zinc dome paste to anchor o Elevate legs to the level of the heart and pump ankles as often as possible Additional Orders / Instructions Wound #1 Right,Anterior Lower Leg o Increase protein intake. o Activity as tolerated [name], [name] ([id number]) Electronic Signature(s) Signed: 04/13/2017 5:59:51 PM By: Baltazar Najjar MD Signed: 04/14/2017 6:10:04 PM By: Elpidio Eric BSN, RN Entered By: Elpidio Eric on 04/13/2017 15:52:58 [name], [name]  ([id number]) -------------------------------------------------------------------------------- Problem List Details Patient Name: [name] Date of Service: 04/13/2017 3:00 PM Medical Record Patient Account Number: [id number] Number: Treating RN: Clover Mealy, RN, BSN, [place] Sink 23-Oct-1928 (83 y.o.  Other Clinician: Date of Birth/Sex: Female) Treating Yahayra Geis Primary Care Provider: SYSTEM, PCP Provider/Extender: G Referring Provider: Myrtie Neither in Treatment: 7 Active Problems ICD-10 Encounter Code Description Active Date Diagnosis S81.811D Laceration without foreign body, right lower leg, 02/23/2017 Yes subsequent encounter I87.321 Chronic venous hypertension (idiopathic) with 02/23/2017 Yes inflammation of right lower extremity Inactive Problems Resolved Problems Electronic Signature(s) Signed: 04/13/2017 5:59:51 PM By: Baltazar Najjar MD Entered By: Baltazar Najjar on 04/13/2017 16:58:53 [name], [name] ([id number]) -------------------------------------------------------------------------------- Progress Note Details Patient Name: [name] Date of Service: 04/13/2017 3:00 PM Medical Record Patient Account Number: [id number] Number: Treating RN: Clover Mealy, RN, BSN, [place]ot 30-Apr-1929 (83 y.o. Other Clinician: Date of Birth/Sex: Female) Treating Maryiah Olvey Primary Care Provider: SYSTEM, PCP Provider/Extender: G Referring Provider: Myrtie Neither in Treatment: 7 Subjective Chief Complaint Information obtained from Patient 02/23/17; patient is here for review of wound on the right anterior lower leg History of Present Illness (HPI) 02/23/17 this is an 83 year old woman who is at a fitness club about to sit down on a rowing machine. She fell and traumatized the anterior part of her right leg on a sharp metal surface. This caused immediate pain and bleeding. She stopped off on her [place] home at an urgent care ultimately was referred to the ER and  by that time she had a significant hematoma of her right leg. She was followed in the urgent orthopedic clinic. She had a duplex ultrasound of the right leg on 5/10 that was negative for DVT x-ray of the right leg on 01/28/17 was negative including a CT scan of the tib-fib. The CT scan did show the subcutaneous hematoma which on 01/28/17 measured 7.7 cm craniocaudal by 2.5 cm x 5.6 cm AP subcutaneous air was also identified felt likely secondary to small lacerations. There was no acute fracture. The patient has been continuing mostly with topical antibiotics on this wound. She did not have any compression. She is referred here from the urgent orthopedic clinic for review of this. She did not have a wound history she is not a diabetic. ABI in this clinic on the right was 1.1 on the left 1.0. She is on aspirin as her only anti-coagulant 03/02/17; the patient arrives for review of a traumatic wound on her right anterior leg with subsequent hematoma formation and considerable tissue loss on the right anterior lateral leg. We'll use silver alginate last week. We kept her under compression and the patient states that her pain was actually a lot better. Wound measures at 5 x 2.5 x 2. Consider home amount of undermining laterally by roughly 3.5 cm from 7 to 12:00 03/09/17; wound actually looks better however there is considerable undermining from roughly 7 to 12:00. No evidence of surrounding infection. A snap VAC was applied today 03/17/17; the patient continues to do quite well. There have been some logistic problems with the wound VAC however we have managed to keep this in place. She did fill out one of the canisters but didn't come in in follow-up. We've been using silver collagen under a snap VAC 03/23/17; on intake today the patient appeared to have a new tunnel superiorly in the wound by 4 cm. This is separated from the undermining from roughly 7 or 11:00 she has had chronically. This undermines at  1.8 cm. We've been using silver collagen under a snapVAC 03/26/2017 -- patient of Dr. Leanord Hawking who came in to see me today because she has developed swelling and redness over the area where  the snap vacuum was applied and has been having significant discomfort. Culture taken last week is not yet in 04/06/17 on evaluation today patient's wound appears to be doing well with an excellent granulation bed. Unfortunately she does have some swelling in the immediate the sanity of the wound which I fear may be causing some delayed healing. Fortunately there does not appear to be any evidence of infection. [name], [name] ([id number]) 04/13/17; the patient's wound is a lot better than when I last saw this 3 weeks ago. Healthy granulation. The tunneling sites of closed over. As noted last week she does have some swelling especially medially. Therefore I agree with continued compression. We have been using silver collagen Objective Constitutional Patient is hypertensive.. Pulse regular and within target range for patient.Marland Kitchen Respirations regular, non-labored and within target range.. Temperature is normal and within the target range for the patient.Marland Kitchen appears in no distress. Vitals Time Taken: 3:11 PM, Height: 64 in, Weight: 138 lbs, BMI: 23.7, Temperature: 97.7 F, Pulse: 79 bpm, Respiratory Rate: 16 breaths/min, Blood Pressure: 163/65 mmHg. Eyes Conjunctivae clear. No discharge. Respiratory Respiratory effort is easy and symmetric bilaterally. Rate is normal at rest and on room air.. Cardiovascular Pedal pulses palpable and strong bilaterally.. Edema control is marginal. Lymphatic None palpable in the right popliteal or inguinal area. Psychiatric No evidence of depression, anxiety, or agitation. Calm, cooperative, and communicative. Appropriate interactions and affect.. General Notes: 04/13/17; the patient's wound bed looks very healthy. Bright red granulation. There is no surrounding erythema and no  crepitus. There is some edema medially and superiorly Integumentary (Hair, Skin) Wound #1 status is Open. Original cause of wound was Trauma. The wound is located on the Right,Anterior Lower Leg. The wound measures 3.3cm length x 1.5cm width x 0.4cm depth; 3.888cm^2 area and 1.555cm^3 volume. There is Fat Layer (Subcutaneous Tissue) Exposed exposed. There is no tunneling or undermining noted. There is a large amount of serous drainage noted. The wound margin is epibole. There is large (67-100%) red granulation within the wound bed. There is a small (1-33%) amount of necrotic tissue within the wound bed including Adherent Slough. The periwound skin appearance exhibited: Hemosiderin Staining, Mottled. The periwound skin appearance did not exhibit: Callus, Crepitus, Gabrielson, [name] ([id number]) Excoriation, Induration, Rash, Scarring, Dry/Scaly, Maceration, Atrophie Blanche, Cyanosis, Ecchymosis, Pallor, Rubor, Erythema. Periwound temperature was noted as No Abnormality. The periwound has tenderness on palpation. Assessment Active Problems ICD-10 S81.811D - Laceration without foreign body, right lower leg, subsequent encounter I87.321 - Chronic venous hypertension (idiopathic) with inflammation of right lower extremity Plan Wound Cleansing: Wound #1 Right,Anterior Lower Leg: Cleanse wound with mild soap and water No tub bath. Anesthetic: Wound #1 Right,Anterior Lower Leg: Topical Lidocaine 4% cream applied to wound bed prior to debridement Primary Wound Dressing: Wound #1 Right,Anterior Lower Leg: Prisma Ag Secondary Dressing: Wound #1 Right,Anterior Lower Leg: ABD pad Dressing Change Frequency: Wound #1 Right,Anterior Lower Leg: Change dressing every week Follow-up Appointments: Wound #1 Right,Anterior Lower Leg: Return Appointment in 1 week. Nurse Visit as needed Edema Control: 3 Layer Compression System - Right Lower Extremity - Zinc dome paste to anchor Elevate legs to the level  of the heart and pump ankles as often as possible Additional Orders / Instructions: Wound #1 Right,Anterior Lower Leg: Increase protein intake. Activity as tolerated Walters, Chalet ([id number]) #1 I agree with the current dressings which are silver collagen/ABDs/3 layer compression #2 the tunneled areas have closed over I'm not actually sure whether they granulated in or there is  virtual space here in any case we'll have to wait and see over time if this causes any problem. . Electronic Signature(s) Signed: 04/13/2017 5:05:20 PM By: Baltazar Najjarobson, Norris Bodley MD Entered By: Baltazar Najjarobson, Lanae Federer on 04/13/2017 17:05:20 [name], Manette ([id number]) -------------------------------------------------------------------------------- SuperBill Details Patient Name: [name] CollumBERMAN, Kathie Date of Service: 04/13/2017 Medical Record Patient Account Number: [id number] Number: Treating RN: Clover MealyAfful, RN, BSN, Mariela 09/09/1929 (83 y.o. Other Clinician: Date of Birth/Sex: Female) Treating Geoffrey Mankin Primary Care Provider: SYSTEM, PCP Provider/Extender: G Referring Provider: Myrtie NeitherJONES, [name] Weeks in Treatment: 7 Diagnosis Coding ICD-10 Codes Code Description S81.811D Laceration without foreign body, right lower leg, subsequent encounter I87.321 Chronic venous hypertension (idiopathic) with inflammation of right lower extremity Facility Procedures CPT4: Description Modifier Quantity Code [id number] (Facility Use Only) [id number] - APPLY MULTLAY COMPRS LWR RT 1 LEG Physician Procedures CPT4: Description Modifier Quantity Code [id number] - WC PHYS LEVEL 3 - EST PT 1 ICD-10 Description Diagnosis S81.811D Laceration without foreign body, right lower leg, subsequent encounter I87.321 Chronic venous hypertension (idiopathic) with  inflammation of right lower extremity Electronic Signature(s) Signed: 04/13/2017 5:59:51 PM By: Baltazar Najjarobson, Martina Brodbeck MD Entered By: Baltazar Najjarobson, Richele Strand on 04/13/2017 17:05:42

## 2017-04-20 ENCOUNTER — Encounter: Payer: Medicare Other | Admitting: Internal Medicine

## 2017-04-20 DIAGNOSIS — S81811D Laceration without foreign body, right lower leg, subsequent encounter: Secondary | ICD-10-CM | POA: Diagnosis not present

## 2017-04-22 NOTE — Progress Notes (Signed)
KENZY, CAMPOVERDE ([id number]) Visit Report for 04/20/2017 Arrival Information Details Patient Name: [name], [name] Date of Service: 04/20/2017 3:00 PM Medical Record Number: [id number] Patient Account Number: [id number] Date of Birth/Sex: [birth year] (83 y.o. Female) Treating RN: Afful, RN, BSN, [employer] Primary Care Paizleigh Wilds: SYSTEM, PCP Other Clinician: Referring Tavari Loadholt: Altamese Cabal Treating Kisa Fujii/Extender: Altamese [place] in Treatment: 8 Visit Information History Since Last Visit All ordered tests and consults were completed: No Patient Arrived: [name] Added or deleted any medications: No Arrival Time: 15:05 Any new allergies or adverse reactions: No Accompanied By: dtr Had a fall or experienced change in No Transfer Assistance: None activities of daily living that may affect Patient Identification Verified: Yes risk of falls: Secondary Verification Process Completed: Yes Signs or symptoms of abuse/neglect since last No Patient Requires Transmission-Based No visito Precautions: Hospitalized since last visit: No Patient Has Alerts: No Has Dressing in Place as Prescribed: Yes Has Compression in Place as Prescribed: Yes Pain Present Now: No Electronic Signature(s) Signed: 04/20/2017 5:27:52 PM By: Elpidio Eric BSN, RN Entered By: Elpidio Eric on 04/20/2017 15:07:42 [place]ho[place], Dyane ([id number]) -------------------------------------------------------------------------------- Encounter Discharge Information Details Patient Name: [name] Date of Service: 04/20/2017 3:00 PM Medical Record Number: [id number] Patient Account Number: [id number] Date of Birth/Sex: [birth year] (83 y.o. Female) Treating RN: Afful, RN, BSN, [employer] Primary Care Deleah Tison: SYSTEM, PCP Other Clinician: Referring Oval Moralez: Altamese Cabal Treating Chelsy Parrales/Extender: Altamese [place] in Treatment: 8 Encounter Discharge Information Items Discharge Pain Level: 0 Discharge Condition: Stable Ambulatory  Status: Cane Discharge Destination: Home Transportation: Private Auto Schedule Follow-up Appointment: No Medication Reconciliation completed No and provided to Patient/Care Embry Manrique: Provided on Clinical Summary of Care: 04/20/2017 Form Type Recipient Paper Patient RB Electronic Signature(s) Signed: 04/20/2017 5:27:52 PM By: Elpidio Eric BSN, RN Previous Signature: 04/20/2017 3:43:17 PM Version By: Gwenlyn Perking Entered By: Elpidio Eric on 04/20/2017 15:45:28 Tibbits, Vonceil ([id number]) -------------------------------------------------------------------------------- Lower Extremity Assessment Details Patient Name: [name] Date of Service: 04/20/2017 3:00 PM Medical Record Number: [id number] Patient Account Number: [id number] Date of Birth/Sex: [birth year] (83 y.o. Female) Treating RN: Afful, RN, BSN, [employer] Primary Care Arrie Zuercher: SYSTEM, PCP Other Clinician: Referring Rodrickus Min: Altamese Cabal Treating Lacresia Darwish/Extender: Altamese [place] in Treatment: 8 Edema Assessment Assessed: [Left: No] [Right: No] E[Left: dema] [Right: :] Calf Left: Right: Point of Measurement: 28 cm From Medial Instep cm cm Ankle Left: Right: Point of Measurement: 8 cm From Medial Instep cm cm Vascular Assessment Claudication: Claudication Assessment [Right:None] Pulses: Dorsalis Pedis Palpable: [Right:Yes] Posterior Tibial Extremity colors, hair growth, and conditions: Extremity Color: [Right:Mottled] Hair Growth on Extremity: [Right:Yes] Temperature of Extremity: [Right:Warm] Capillary Refill: [Right:< 3 seconds] Toe Nail Assessment Left: Right: Thick: No Discolored: No Deformed: No Improper Length and Hygiene: No Electronic Signature(s) Signed: 04/20/2017 5:27:52 PM By: Elpidio Eric BSN, RN Entered By: Elpidio Eric on 04/20/2017 15:09:02 Breth, Valrie ([id number]) Selinda Orion, Zaidee ([id number]) -------------------------------------------------------------------------------- Multi Wound Chart  Details Patient Name: [name] Date of Service: 04/20/2017 3:00 PM Medical Record Number: [id number] Patient Account Number: [id number] Date of Birth/Sex: Jun 28, 1929 (83 y.o. Female) Treating RN: Afful, RN, BSN, [employer] Primary Care Chanler Mendonca: SYSTEM, PCP Other Clinician: Referring Kjirsten Bloodgood: Altamese Cabal Treating Turon Kilmer/Extender: Altamese [place] in Treatment: 8 Vital Signs Height(in): 64 Pulse(bpm): 76 Weight(lbs): 138 Blood Pressure 163/72 (mmHg): Body Mass Index(BMI): 24 Temperature(F): 97.6 Respiratory Rate 16 (breaths/min): Photos: [1:No Photos] [N/A:N/A] Wound Location: [1:Right Lower Leg - Anterior N/A] Wounding Event: [1:Trauma] [N/A:N/A] Primary Etiology: [1:Trauma, Other] [N/A:N/A] Comorbid History: [1:Cataracts, Arrhythmia, Congestive Heart Failure, Hypertension, Osteoarthritis] [  N/A:N/A] Date Acquired: [1:01/28/2017] [N/A:N/A] Weeks of Treatment: [1:8] [N/A:N/A] Wound Status: [1:Open] [N/A:N/A] Measurements L x W x D 0.9x3x0.4 [N/A:N/A] (cm) Area (cm) : [1:2.121] [N/A:N/A] Volume (cm) : [1:0.848] [N/A:N/A] % Reduction in Area: [1:78.40%] [N/A:N/A] % Reduction in Volume: 97.10% [N/A:N/A] Classification: [1:Full Thickness With Exposed Support Structures] [N/A:N/A] Exudate Amount: [1:Large] [N/A:N/A] Exudate Type: [1:Serous] [N/A:N/A] Exudate Color: [1:amber] [N/A:N/A] Wound Margin: [1:Epibole] [N/A:N/A] Granulation Amount: [1:Large (67-100%)] [N/A:N/A] Granulation Quality: [1:Red, Hyper-granulation] [N/A:N/A] Necrotic Amount: [1:Small (1-33%)] [N/A:N/A] Exposed Structures: [1:Fat Layer (Subcutaneous N/A Tissue) Exposed: Yes Fascia: No] Tendon: No Muscle: No Joint: No Bone: No Epithelialization: Small (1-33%) N/A N/A Periwound Skin Texture: Excoriation: No N/A N/A Induration: No Callus: No Crepitus: No Rash: No Scarring: No Periwound Skin Maceration: No N/A N/A Moisture: Dry/Scaly: No Periwound Skin Color: Hemosiderin Staining: Yes N/A  N/A Mottled: Yes Atrophie Blanche: No Cyanosis: No Ecchymosis: No Erythema: No Pallor: No Rubor: No Temperature: No Abnormality N/A N/A Tenderness on Yes N/A N/A Palpation: Wound Preparation: Ulcer Cleansing: N/A N/A Rinsed/Irrigated with Saline, Other: soap and water Topical Anesthetic Applied: None, Other: lidocaine 4% Treatment Notes Wound #1 (Right, Anterior Lower Leg) 1. Cleansed with: Cleanse wound with antibacterial soap and water 3. Peri-wound Care: Barrier cream Moisturizing lotion 4. Dressing Applied: Prisma Ag 5. Secondary Dressing Applied ABD Pad 7. Secured with 3 Layer Compression System - Right Lower Extremity HendrumBERMAN, Stefanny ([id number]) Electronic Signature(s) Signed: 04/20/2017 5:23:04 PM By: Baltazar Najjarobson, Michael MD Entered By: Baltazar Najjarobson, Michael on 04/20/2017 16:09:40 Barlett, Dacey ([id number]) -------------------------------------------------------------------------------- Multi-Disciplinary Care Plan Details Patient Name: [name] CollumBERMAN, Dlynn Date of Service: 04/20/2017 3:00 PM Medical Record Number: [id number] Patient Account Number: [id number] Date of Birth/Sex: 10/18/1928 (83 y.o. Female) Treating RN: Afful, RN, BSN, [employer] Primary Care Tony Granquist: SYSTEM, PCP Other Clinician: Referring Fronnie Urton: Altamese CabalJONES, MAURICE Treating Tranice Laduke/Extender: Altamese CarolinaOBSON, MICHAEL G Weeks in Treatment: 8 Active Inactive ` Abuse / Safety / Falls / Self Care Management Nursing Diagnoses: History of Falls Impaired home maintenance Impaired physical mobility Potential for falls Potential for injury related to transfers Goals: Patient will not develop complications from immobility Date Initiated: 02/23/2017 Target Resolution Date: 07/26/2017 Goal Status: Active Patient will not experience any injury related to falls Date Initiated: 02/23/2017 Target Resolution Date: 07/26/2017 Goal Status: Active Patient will remain injury free related to falls Date Initiated: 02/23/2017 Target Resolution  Date: 07/26/2017 Goal Status: Active Patient/caregiver will demonstrate safe use of adaptive devices to increase mobility Date Initiated: 02/23/2017 Target Resolution Date: 07/26/2017 Goal Status: Active Patient/caregiver will identify factors that restrict self-care and home management Date Initiated: 02/23/2017 Target Resolution Date: 07/26/2017 Goal Status: Active Patient/caregiver will verbalize understanding of skin care regimen Date Initiated: 02/23/2017 Target Resolution Date: 07/26/2017 Goal Status: Active Patient/caregiver will verbalize understanding of the importance to maintain current immunizations/vaccinations Date Initiated: 02/23/2017 Target Resolution Date: 07/26/2017 Goal Status: Active Patient/caregiver will verbalize/demonstrate measure taken to improve self care Date Initiated: 02/23/2017 Target Resolution Date: 07/26/2017 [name] CollumBERMAN, Neilani ([id number]) Goal Status: Active Patient/caregiver will verbalize/demonstrate measures taken to improve the patient's personal safety Date Initiated: 02/23/2017 Target Resolution Date: 07/26/2017 Goal Status: Active Patient/caregiver will verbalize/demonstrate measures taken to prevent injury and/or falls Date Initiated: 02/23/2017 Target Resolution Date: 07/26/2017 Goal Status: Active Patient/caregiver will verbalize/demonstrate understanding of what to do in case of emergency Date Initiated: 02/23/2017 Target Resolution Date: 07/26/2017 Goal Status: Active Interventions: Call light and/or bell within patient's reach Assess Activities of Daily Living upon admission and as needed Assess fall risk on admission and as needed Assess: immobility, friction, shearing, incontinence upon  admission and as needed Assess impairment of mobility on admission and as needed per policy Assess personal safety and home safety (as indicated) on admission and as needed Assess self care needs on admission and as needed Provide education on basic  hygiene Provide education on fall prevention Provide education on personal and home safety Provide education on safe transfers Treatment Activities: Education provided on Basic Hygiene : 04/13/2017 Notes: ` Orientation to the Wound Care Program Nursing Diagnoses: Knowledge deficit related to the wound healing center program Goals: Patient/caregiver will verbalize understanding of the Wound Healing Center Program Date Initiated: 02/23/2017 Target Resolution Date: 07/26/2017 Goal Status: Active Interventions: Provide education on orientation to the wound center Notes: ` [name], Burnadette ([id number]) Wound/Skin Impairment Nursing Diagnoses: Impaired tissue integrity Knowledge deficit related to ulceration/compromised skin integrity Goals: Patient/caregiver will verbalize understanding of skin care regimen Date Initiated: 02/23/2017 Target Resolution Date: 07/26/2017 Goal Status: Active Ulcer/skin breakdown will have a volume reduction of 30% by week 4 Date Initiated: 02/23/2017 Target Resolution Date: 07/26/2017 Goal Status: Active Ulcer/skin breakdown will have a volume reduction of 50% by week 8 Date Initiated: 02/23/2017 Target Resolution Date: 07/26/2017 Goal Status: Active Ulcer/skin breakdown will have a volume reduction of 80% by week 12 Date Initiated: 02/23/2017 Target Resolution Date: 07/26/2017 Goal Status: Active Ulcer/skin breakdown will heal within 14 weeks Date Initiated: 02/23/2017 Target Resolution Date: 07/26/2017 Goal Status: Active Interventions: Assess patient/caregiver ability to obtain necessary supplies Assess patient/caregiver ability to perform ulcer/skin care regimen upon admission and as needed Assess ulceration(s) every visit Provide education on ulcer and skin care Treatment Activities: Referred to DME Shardae Kleinman for dressing supplies : 02/23/2017 Skin care regimen initiated : 02/23/2017 Topical wound management initiated :  02/23/2017 Notes: Electronic Signature(s) Signed: 04/20/2017 5:27:52 PM By: Elpidio EricAfful, Mckenleigh BSN, RN Entered By: Elpidio EricAfful, Jayci on 04/20/2017 15:25:20 Heng, Jamey ([id number]) -------------------------------------------------------------------------------- Pain Assessment Details Patient Name: [name] CollumBERMAN, Calley Date of Service: 04/20/2017 3:00 PM Medical Record Number: [id number] Patient Account Number: [id number] Date of Birth/Sex: 03/26/1929 (83 y.o. Female) Treating RN: Afful, RN, BSN, [employer] Primary Care Aidaly Cordner: SYSTEM, PCP Other Clinician: Referring Dietrick Barris: Altamese CabalJONES, MAURICE Treating Yanet Balliet/Extender: Altamese CarolinaOBSON, MICHAEL G Weeks in Treatment: 8 Active Problems Location of Pain Severity and Description of Pain Patient Has Paino No Site Locations With Dressing Change: No Pain Management and Medication Current Pain Management: Electronic Signature(s) Signed: 04/20/2017 5:27:52 PM By: Elpidio EricAfful, Amarya BSN, RN Entered By: Elpidio EricAfful, Brittnae on 04/20/2017 15:07:48 Rosenow, Burkley ([id number]) -------------------------------------------------------------------------------- Patient/Caregiver Education Details Patient Name: [name] CollumBERMAN, Lynnae Date of Service: 04/20/2017 3:00 PM Medical Record Patient Account Number: [id number] Number: Treating RN: Clover MealyAfful, RN, BSN, Rudene 09/18/1929 (83 y.o. Other Clinician: Date of Birth/Gender: Female) Treating ROBSON, MICHAEL Primary Care Physician: SYSTEM, PCP Physician/Extender: G Referring Physician: Myrtie NeitherJONES, MAURICE Weeks in Treatment: 8 Education Assessment Education Provided To: Patient Education Topics Provided Basic Hygiene: Methods: Explain/Verbal Responses: State content correctly Safety: Methods: Explain/Verbal Responses: State content correctly Welcome To The W[facility] Care Center: Methods: Explain/Verbal Responses: State content correctly Wound/Skin Impairment: Methods: Explain/Verbal Responses: State content correctly Electronic Signature(s) Signed: 04/20/2017  5:27:52 PM By: Elpidio EricAfful, Andrya BSN, RN Entered By: Elpidio EricAfful, Kinza on 04/20/2017 15:45:46 Rosasco, Kavina ([id number]) -------------------------------------------------------------------------------- Wound Assessment Details Patient Name: [name] CollumBERMAN, Audry Date of Service: 04/20/2017 3:00 PM Medical Record Number: [id number] Patient Account Number: [id number] Date of Birth/Sex: 10/26/1928 (83 y.o. Female) Treating RN: Clover MealyAfful, RN, BSN, [employer] Primary Care Rikki Trosper: SYSTEM, PCP Other Clinician: Referring Aamani Moose: Altamese CabalJONES, MAURICE Treating Darious Rehman/Extender: Maxwell CaulOBSON, MICHAEL G Weeks in Treatment: 8 Wound Status Wound Number:  1 Primary Trauma, Other Etiology: Wound Location: Right Lower Leg - Anterior Wound Open Wounding Event: Trauma Status: Date Acquired: 01/28/2017 Comorbid Cataracts, Arrhythmia, Congestive Weeks Of Treatment: 8 History: Heart Failure, Hypertension, Clustered Wound: No Osteoarthritis Photos Photo Uploaded By: Elliot Gurney, BSN, RN, CWS, Kim on 04/20/2017 17:25:13 Wound Measurements Length: (cm) 0.9 Width: (cm) 3 Depth: (cm) 0.4 Area: (cm) 2.121 Volume: (cm) 0.848 % Reduction in Area: 78.4% % Reduction in Volume: 97.1% Epithelialization: Small (1-33%) Tunneling: No Undermining: No Wound Description Full Thickness With Exposed Classification: Support Structures Wound Margin: Epibole Exudate Large Amount: Exudate Type: Serous Exudate Color: amber Foul Odor After Cleansing: No Slough/Fibrino Yes Wound Bed Granulation Amount: Large (67-100%) Exposed Structure Granulation Quality: Red, Hyper-granulation Fascia Exposed: No Saintil, Caterin ([id number]) Necrotic Amount: Small (1-33%) Fat Layer (Subcutaneous Tissue) Exposed: Yes Necrotic Quality: Adherent Slough Tendon Exposed: No Muscle Exposed: No Joint Exposed: No Bone Exposed: No Periwound Skin Texture Texture Color No Abnormalities Noted: No No Abnormalities Noted: No Callus: No Atrophie Blanche: No Crepitus:  No Cyanosis: No Excoriation: No Ecchymosis: No Induration: No Erythema: No Rash: No Hemosiderin Staining: Yes Scarring: No Mottled: Yes Pallor: No Moisture Rubor: No No Abnormalities Noted: No Dry / Scaly: No Temperature / Pain Maceration: No Temperature: No Abnormality Tenderness on Palpation: Yes Wound Preparation Ulcer Cleansing: Rinsed/Irrigated with Saline, Other: soap and water, Topical Anesthetic Applied: None, Other: lidocaine 4%, Treatment Notes Wound #1 (Right, Anterior Lower Leg) 1. Cleansed with: Cleanse wound with antibacterial soap and water 3. Peri-wound Care: Barrier cream Moisturizing lotion 4. Dressing Applied: Prisma Ag 5. Secondary Dressing Applied ABD Pad 7. Secured with 3 Layer Compression System - Right Lower Extremity Electronic Signature(s) Signed: 04/20/2017 5:27:52 PM By: Elpidio Eric BSN, RN Entered By: Elpidio Eric on 04/20/2017 15:17:49 [place], Dajane ([id number]) -------------------------------------------------------------------------------- Vitals Details Patient Name: [name] Date of Service: 04/20/2017 3:00 PM Medical Record Number: [id number] Patient Account Number: [id number] Date of Birth/Sex: 05-05-1929 (83 y.o. Female) Treating RN: Afful, RN, BSN, [employer] Primary Care Junior Huezo: SYSTEM, PCP Other Clinician: Referring Chetara Kropp: Altamese Cabal Treating Mannix Kroeker/Extender: Altamese [place] in Treatment: 8 Vital Signs Time Taken: 15:07 Temperature (F): 97.6 Height (in): 64 Pulse (bpm): 76 Weight (lbs): 138 Respiratory Rate (breaths/min): 16 Body Mass Index (BMI): 23.7 Blood Pressure (mmHg): 163/72 Reference Range: 80 - 120 mg / dl Electronic Signature(s) Signed: 04/20/2017 5:27:52 PM By: Elpidio Eric BSN, RN Entered By: Elpidio Eric on 04/20/2017 15:08:12

## 2017-04-22 NOTE — Progress Notes (Signed)
[name], [name] ([id number]) Visit Report for 04/20/2017 HPI Details Patient Name: [name], [name] Date of Service: 04/20/2017 3:00 PM Medical Record Patient Account Number: [id number] Number: Treating RN: Clover Mealy, RN, BSN, [place] Sink 10/17/1928 (83 y.o. Other Clinician: Date of Birth/Sex: Female) Treating Vana Arif Primary Care Provider: SYSTEM, PCP Provider/Extender: G Referring Provider: Myrtie Neither in Treatment: 8 History of Present Illness HPI Description: 02/23/17 this is an 83 year old woman who is at a fitness club about to sit down on a rowing machine. She fell and traumatized the anterior part of her right leg on a sharp metal surface. This caused immediate pain and bleeding. She stopped off on her way home at an urgent care ultimately was referred to the ER and by that time she had a significant hematoma of her right leg. She was followed in the urgent orthopedic clinic. She had a duplex ultrasound of the right leg on 5/10 that was negative for DVT x-ray of the right leg on 01/28/17 was negative including a CT scan of the tib-fib. The CT scan did show the subcutaneous hematoma which on 01/28/17 measured 7.7 cm craniocaudal by 2.5 cm x 5.6 cm AP subcutaneous air was also identified felt likely secondary to small lacerations. There was no acute fracture. The patient has been continuing mostly with topical antibiotics on this wound. She did not have any compression. She is referred here from the urgent orthopedic clinic for review of this. She did not have a wound history she is not a diabetic. ABI in this clinic on the right was 1.1 on the left 1.0. She is on aspirin as her only anti-coagulant 03/02/17; the patient arrives for review of a traumatic wound on her right anterior leg with subsequent hematoma formation and considerable tissue loss on the right anterior lateral leg. We'll use silver alginate last week. We kept her under compression and the patient states that her pain  was actually a lot better. Wound measures at 5 x 2.5 x 2. Consider home amount of undermining laterally by roughly 3.5 cm from 7 to 12:00 03/09/17; wound actually looks better however there is considerable undermining from roughly 7 to 12:00. No evidence of surrounding infection. A snap VAC was applied today 03/17/17; the patient continues to do quite well. There have been some logistic problems with the wound VAC however we have managed to keep this in place. She did fill out one of the canisters but didn't come in in follow-up. We've been using silver collagen under a snap VAC 03/23/17; on intake today the patient appeared to have a new tunnel superiorly in the wound by 4 cm. This is separated from the undermining from roughly 7 or 11:00 she has had chronically. This undermines at 1.8 cm. We've been using silver collagen under a snapVAC 03/26/2017 -- patient of Dr. Leanord Hawking who came in to see me today because she has developed swelling and redness over the area where the snap vacuum was applied and has been having significant discomfort. Culture taken last week is not yet in 04/06/17 on evaluation today patient's wound appears to be doing well with an excellent granulation bed. Unfortunately she does have some swelling in the immediate the sanity of the wound which I fear may be causing some delayed healing. Fortunately there does not appear to be any evidence of infection. 04/13/17; the patient's wound is a lot better than when I last saw this 3 weeks ago. Healthy granulation. The tunneling sites of closed over. As noted last week  she does have some swelling especially medially. [facility], Cliffie ([id number]) Therefore I agree with continued compression. We have been using silver collagen 04/20/17; continued improvement in the overall condition of the wound bed. She complains of numbness in the lateral third toes and also some weakness. After further questioning it would appear that this is been present  for a while [not wrap injury]. The original trauma was associated with a deep hematoma, certainly nerve injury here isn't impossible Electronic Signature(s) Signed: 04/20/2017 5:23:04 PM By: Baltazar Najjar MD Entered By: Baltazar Najjar on 04/20/2017 16:11:42 [place], Gabriell ([id number]) -------------------------------------------------------------------------------- Physical Exam Details Patient Name: [name] Date of Service: 04/20/2017 3:00 PM Medical Record Patient Account Number: [id number] Number: Treating RN: Clover Mealy, RN, BSN, [place] Sink October 15, 1928 (83 y.o. Other Clinician: Date of Birth/Sex: Female) Treating Keishana Klinger Primary Care Provider: SYSTEM, PCP Provider/Extender: G Referring Provider: Myrtie Neither in Treatment: 8 Constitutional Patient is hypertensive.. Pulse regular and within target range for patient.Marland Kitchen Respirations regular, non-labored and within target range.. Temperature is normal and within the target range for the patient.Marland Kitchen appears in no distress. Respiratory Respiratory effort is easy and symmetric bilaterally. Rate is normal at rest and on room air.. Cardiovascular Pedal pulses palpable and strong bilaterally.. Lymphatic None palpable in the popliteal or inguinal area on the right. Musculoskeletal No obvious joint problems.. Integumentary (Hair, Skin) No rash. Neurological There is weakness of dorsi flexion of the toes. Ankle reflexes normal. There is some loss of light touch in the dorsal surface of the third toe she picks this up about halfway up the foot. Notes Wound exam; the patient's wound bed continues to look very healthy. Bright red granulation. There is no undermining or tunneling. No surrounding soft tissue or crepitus. Electronic Signature(s) Signed: 04/20/2017 5:23:04 PM By: Baltazar Najjar MD Entered By: Baltazar Najjar on 04/20/2017 16:13:47 Rodrigue, Dalal  ([id number]) -------------------------------------------------------------------------------- Physician Orders Details Patient Name: [name] Date of Service: 04/20/2017 3:00 PM Medical Record Patient Account Number: [id number] Number: Treating RN: Clover Mealy, RN, BSN, [place] 12-Aug-1929 (83 y.o. Other Clinician: Date of Birth/Sex: Female) Treating Lillyan Hitson Primary Care Provider: SYSTEM, PCP Provider/Extender: G Referring Provider: Myrtie Neither in Treatment: 8 Verbal / Phone Orders: No Diagnosis Coding Wound Cleansing Wound #1 Right,Anterior Lower Leg o Cleanse wound with mild soap and water o No tub bath. Anesthetic Wound #1 Right,Anterior Lower Leg o Topical Lidocaine 4% cream applied to wound bed prior to debridement Primary Wound Dressing Wound #1 Right,Anterior Lower Leg o Prisma Ag Secondary Dressing Wound #1 Right,Anterior Lower Leg o ABD pad Dressing Change Frequency Wound #1 Right,Anterior Lower Leg o Change dressing every week Follow-up Appointments Wound #1 Right,Anterior Lower Leg o Return Appointment in 1 week. o Nurse Visit as needed Edema Control o 3 Layer Compression System - Right Lower Extremity - Zinc dome paste to anchor o Elevate legs to the level of the heart and pump ankles as often as possible Additional Orders / Instructions Wound #1 Right,Anterior Lower Leg o Increase protein intake. o Activity as tolerated Sytsma, Teela ([id number]) Electronic Signature(s) Signed: 04/20/2017 5:23:04 PM By: Baltazar Najjar MD Signed: 04/20/2017 5:27:52 PM By: Elpidio Eric BSN, RN Entered By: Elpidio Eric on 04/20/2017 15:26:56 Saulters, Anaeli ([id number]) -------------------------------------------------------------------------------- Problem List Details Patient Name: [name] Date of Service: 04/20/2017 3:00 PM Medical Record Patient Account Number: [id number] Number: Treating RN: Clover Mealy, RN, BSN,  [place] Sink [birth year] (83 y.o. Other Clinician: Date of Birth/Sex: Female) Treating Claudia Greenley Primary Care Provider: SYSTEM, PCP Provider/Extender: Reece Agar  Referring Provider: Myrtie Neither in Treatment: 8 Active Problems ICD-10 Encounter Code Description Active Date Diagnosis S81.811D Laceration without foreign body, right lower leg, 02/23/2017 Yes subsequent encounter I87.321 Chronic venous hypertension (idiopathic) with 02/23/2017 Yes inflammation of right lower extremity Inactive Problems Resolved Problems Electronic Signature(s) Signed: 04/20/2017 5:23:04 PM By: Baltazar Najjar MD Entered By: Baltazar Najjar on 04/20/2017 16:09:30 Mauri, Celia ([id number]) -------------------------------------------------------------------------------- Progress Note Details Patient Name: [name] Date of Service: 04/20/2017 3:00 PM Medical Record Patient Account Number: [id number] Number: Treating RN: Clover Mealy, RN, BSN, Doris 05-Apr-1929 (83 y.o. Other Clinician: Date of Birth/Sex: Female) Treating Arnold Depinto Primary Care Provider: SYSTEM, PCP Provider/Extender: G Referring Provider: Myrtie Neither in Treatment: 8 Subjective History of Present Illness (HPI) 02/23/17 this is an 83 year old woman who is at a fitness club about to sit down on a rowing machine. She fell and traumatized the anterior part of her right leg on a sharp metal surface. This caused immediate pain and bleeding. She stopped off on her way home at an urgent care ultimately was referred to the ER and by that time she had a significant hematoma of her right leg. She was followed in the urgent orthopedic clinic. She had a duplex ultrasound of the right leg on 5/10 that was negative for DVT x-ray of the right leg on 01/28/17 was negative including a CT scan of the tib-fib. The CT scan did show the subcutaneous hematoma which on 01/28/17 measured 7.7 cm craniocaudal by 2.5 cm x 5.6 cm AP subcutaneous air was also  identified felt likely secondary to small lacerations. There was no acute fracture. The patient has been continuing mostly with topical antibiotics on this wound. She did not have any compression. She is referred here from the urgent orthopedic clinic for review of this. She did not have a wound history she is not a diabetic. ABI in this clinic on the right was 1.1 on the left 1.0. She is on aspirin as her only anti-coagulant 03/02/17; the patient arrives for review of a traumatic wound on her right anterior leg with subsequent hematoma formation and considerable tissue loss on the right anterior lateral leg. We'll use silver alginate last week. We kept her under compression and the patient states that her pain was actually a lot better. Wound measures at 5 x 2.5 x 2. Consider home amount of undermining laterally by roughly 3.5 cm from 7 to 12:00 03/09/17; wound actually looks better however there is considerable undermining from roughly 7 to 12:00. No evidence of surrounding infection. A snap VAC was applied today 03/17/17; the patient continues to do quite well. There have been some logistic problems with the wound VAC however we have managed to keep this in place. She did fill out one of the canisters but didn't come in in follow-up. We've been using silver collagen under a snap VAC 03/23/17; on intake today the patient appeared to have a new tunnel superiorly in the wound by 4 cm. This is separated from the undermining from roughly 7 or 11:00 she has had chronically. This undermines at 1.8 cm. We've been using silver collagen under a snapVAC 03/26/2017 -- patient of Dr. Leanord Hawking who came in to see me today because she has developed swelling and redness over the area where the snap vacuum was applied and has been having significant discomfort. Culture taken last week is not yet in 04/06/17 on evaluation today patient's wound appears to be doing well with an excellent granulation bed. Unfortunately  she does have some swelling in the immediate the sanity of the wound which I fear may be causing some delayed healing. Fortunately there does not appear to be any evidence of infection. 04/13/17; the patient's wound is a lot better than when I last saw this 3 weeks ago. Healthy granulation. The tunneling sites of closed over. As noted last week she does have some swelling especially medially. Therefore I agree with continued compression. We have been using silver collagen 04/20/17; continued improvement in the overall condition of the wound bed. She complains of numbness in the lateral third toes and also some weakness. After further questioning it would appear that this is been [facility], Tavaria ([id number]) present for a while [not wrap injury]. The original trauma was associated with a deep hematoma, certainly nerve injury here isn't impossible Objective Constitutional Patient is hypertensive.. Pulse regular and within target range for patient.Marland Kitchen. Respirations regular, non-labored and within target range.. Temperature is normal and within the target range for the patient.Marland Kitchen. appears in no distress. Vitals Time Taken: 3:07 PM, Height: 64 in, Weight: 138 lbs, BMI: 23.7, Temperature: 97.6 F, Pulse: 76 bpm, Respiratory Rate: 16 breaths/min, Blood Pressure: 163/72 mmHg. Respiratory Respiratory effort is easy and symmetric bilaterally. Rate is normal at rest and on room air.. Cardiovascular Pedal pulses palpable and strong bilaterally.. Lymphatic None palpable in the popliteal or inguinal area on the right. Musculoskeletal No obvious joint problems.. Neurological There is weakness of dorsi flexion of the toes. Ankle reflexes normal. There is some loss of light touch in the dorsal surface of the third toe she picks this up about halfway up the foot. General Notes: Wound exam; the patient's wound bed continues to look very healthy. Bright red granulation. There is no undermining or tunneling. No  surrounding soft tissue or crepitus. Integumentary (Hair, Skin) No rash. Wound #1 status is Open. Original cause of wound was Trauma. The wound is located on the Right,Anterior Lower Leg. The wound measures 0.9cm length x 3cm width x 0.4cm depth; 2.121cm^2 area and 0.848cm^3 volume. There is Fat Layer (Subcutaneous Tissue) Exposed exposed. There is no tunneling or undermining noted. There is a large amount of serous drainage noted. The wound margin is epibole. There is large (67-100%) red granulation within the wound bed. There is a small (1-33%) amount of necrotic tissue within the wound bed including Adherent Slough. The periwound skin appearance exhibited: Keeler, Doreather ([id number]) Hemosiderin Staining, Mottled. The periwound skin appearance did not exhibit: Callus, Crepitus, Excoriation, Induration, Rash, Scarring, Dry/Scaly, Maceration, Atrophie Blanche, Cyanosis, Ecchymosis, Pallor, Rubor, Erythema. Periwound temperature was noted as No Abnormality. The periwound has tenderness on palpation. Assessment Active Problems ICD-10 S81.811D - Laceration without foreign body, right lower leg, subsequent encounter I87.321 - Chronic venous hypertension (idiopathic) with inflammation of right lower extremity Plan Wound Cleansing: Wound #1 Right,Anterior Lower Leg: Cleanse wound with mild soap and water No tub bath. Anesthetic: Wound #1 Right,Anterior Lower Leg: Topical Lidocaine 4% cream applied to wound bed prior to debridement Primary Wound Dressing: Wound #1 Right,Anterior Lower Leg: Prisma Ag Secondary Dressing: Wound #1 Right,Anterior Lower Leg: ABD pad Dressing Change Frequency: Wound #1 Right,Anterior Lower Leg: Change dressing every week Follow-up Appointments: Wound #1 Right,Anterior Lower Leg: Return Appointment in 1 week. Nurse Visit as needed Edema Control: 3 Layer Compression System - Right Lower Extremity - Zinc dome paste to anchor Elevate legs to the level of the  heart and pump ankles as often as possible Additional Orders / Instructions: Wound #1 Right,Anterior Lower  Leg: Increase protein intake. Activity as tolerated Amezcua, Rikita ([id number]) #1 continue Prisma, ABDs, 3 Lyrica 1 compression #2 I spent a considerable amount of time discussing the patient's neurologic complaints which are new to me. Her exam is compatible with a possible nerve injury.I don't believe this is new Electronic Signature(s) Signed: 04/20/2017 5:23:04 PM By: Baltazar Najjarobson, Jonmichael Beadnell MD Entered By: Baltazar Najjarobson, Josilynn Losh on 04/20/2017 16:17:29 Heldman, Khamryn ([id number]) -------------------------------------------------------------------------------- SuperBill Details Patient Name: [name] CollumBERMAN, Soriah Date of Service: 04/20/2017 Medical Record Patient Account Number: [id number] Number: Treating RN: Clover MealyAfful, RN, BSN, Shawnda 05/16/1929 (83 y.o. Other Clinician: Date of Birth/Sex: Female) Treating Ahmiyah Coil Primary Care Provider: SYSTEM, PCP Provider/Extender: G Referring Provider: Myrtie NeitherJONES, MAURICE Weeks in Treatment: 8 Diagnosis Coding ICD-10 Codes Code Description S81.811D Laceration without foreign body, right lower leg, subsequent encounter I87.321 Chronic venous hypertension (idiopathic) with inflammation of right lower extremity Facility Procedures CPT4: Description Modifier Quantity Code [id number] (Facility Use Only) [id number] - APPLY MULTLAY COMPRS LWR RT 1 LEG Physician Procedures CPT4: Description Modifier Quantity Code [id number] - WC PHYS LEVEL 3 - EST PT 1 ICD-10 Description Diagnosis S81.811D Laceration without foreign body, right lower leg, subsequent encounter I87.321 Chronic venous hypertension (idiopathic) with  inflammation of right lower extremity Electronic Signature(s) Signed: 04/20/2017 5:23:04 PM By: Baltazar Najjarobson, Brekyn Huntoon MD Entered By: Baltazar Najjarobson, Lyam Provencio on 04/20/2017 16:17:59

## 2017-04-27 ENCOUNTER — Encounter: Payer: Medicare Other | Admitting: Internal Medicine

## 2017-04-27 DIAGNOSIS — S81811D Laceration without foreign body, right lower leg, subsequent encounter: Secondary | ICD-10-CM | POA: Diagnosis not present

## 2017-04-29 NOTE — Progress Notes (Signed)
[name], [name] ([id number]) Visit Report for 04/27/2017 Debridement Details Patient Name: [name], [name] Date of Service: 04/27/2017 3:00 PM Medical Record Patient Account Number: [id number] Number: Treating RN: Clover MealyAfful, RN, BSN, [place] Sinkita 11/03/1928 (83 y.o. Other Clinician: Date of Birth/Sex: Female) Treating ROBSON, MICHAEL Primary Care Provider: SYSTEM, PCP Provider/Extender: G Referring Provider: Myrtie NeitherJONES, MAURICE Weeks in Treatment: 9 Debridement Performed for Wound #1 Right,Anterior Lower Leg Assessment: Performed By: Physician Maxwell CaulOBSON, MICHAEL G, MD Debridement: Debridement Pre-procedure Verification/Time Out Yes - 15:30 Taken: Start Time: 15:30 Pain Control: Lidocaine 4% Topical Solution Level: Skin/Subcutaneous Tissue Total Area Debrided (L x 0.5 (cm) x 2.5 (cm) = 1.25 (cm) W): Tissue and other Non-Viable, Fat, Fibrin/Slough, Subcutaneous material debrided: Instrument: Curette Bleeding: Minimum Hemostasis Achieved: Pressure End Time: 15:31 Procedural Pain: 0 Post Procedural Pain: 0 Response to Treatment: Procedure was tolerated well Post Debridement Measurements of Total Wound Length: (cm) 0.5 Width: (cm) 2.5 Depth: (cm) 0.4 Volume: (cm) 0.393 Character of Wound/Ulcer Post Stable Debridement: Post Procedure Diagnosis Same as Pre-procedure Electronic Signature(s) UraniaBERMAN, [name] ([id number]) Signed: 04/27/2017 6:05:23 PM By: Baltazar Najjarobson, Michael MD Signed: 04/28/2017 1:23:55 PM By: Elpidio EricAfful, Latalia BSN, RN Entered By: Baltazar Najjarobson, Michael on 04/27/2017 17:45:25 [name] ([id number]) -------------------------------------------------------------------------------- HPI Details Patient Name: [name], [name] Date of Service: 04/27/2017 3:00 PM Medical Record Patient Account Number: [id number] Number: Treating RN: Clover MealyAfful, RN, BSN, Shinika 09/17/1929 (83 y.o. Other Clinician: Date of Birth/Sex: Female) Treating ROBSON, MICHAEL Primary Care Provider: SYSTEM,  PCP Provider/Extender: G Referring Provider: Myrtie NeitherJONES, MAURICE Weeks in Treatment: 9 History of Present Illness HPI Description: 02/23/17 this is an 83 year old woman who is at a fitness club about to sit down on a rowing machine. She fell and traumatized the anterior part of her right leg on a sharp metal surface. This caused immediate pain and bleeding. She stopped off on her way home at an urgent care ultimately was referred to the ER and by that time she had a significant hematoma of her right leg. She was followed in the urgent orthopedic clinic. She had a duplex ultrasound of the right leg on 5/10 that was negative for DVT x-ray of the right leg on 01/28/17 was negative including a CT scan of the tib-fib. The CT scan did show the subcutaneous hematoma which on 01/28/17 measured 7.7 cm craniocaudal by 2.5 cm x 5.6 cm AP subcutaneous air was also identified felt likely secondary to small lacerations. There was no acute fracture. The patient has been continuing mostly with topical antibiotics on this wound. She did not have any compression. She is referred here from the urgent orthopedic clinic for review of this. She did not have a wound history she is not a diabetic. ABI in this clinic on the right was 1.1 on the left 1.0. She is on aspirin as her only anti-coagulant 03/02/17; the patient arrives for review of a traumatic wound on her right anterior leg with subsequent hematoma formation and considerable tissue loss on the right anterior lateral leg. We'll use silver alginate last week. We kept her under compression and the patient states that her pain was actually a lot better. Wound measures at 5 x 2.5 x 2. Consider home amount of undermining laterally by roughly 3.5 cm from 7 to 12:00 03/09/17; wound actually looks better however there is considerable undermining from roughly 7 to 12:00. No evidence of surrounding infection. A snap VAC was applied today 03/17/17; the patient continues to do quite  well. There have been some logistic problems with the wound VAC  however we have managed to keep this in place. She did fill out one of the canisters but didn't come in in follow-up. We've been using silver collagen under a snap VAC 03/23/17; on intake today the patient appeared to have a new tunnel superiorly in the wound by 4 cm. This is separated from the undermining from roughly 7 or 11:00 she has had chronically. This undermines at 1.8 cm. We've been using silver collagen under a snapVAC 03/26/2017 -- patient of Dr. Leanord Hawking who came in to see me today because she has developed swelling and redness over the area where the snap vacuum was applied and has been having significant discomfort. Culture taken last week is not yet in 04/06/17 on evaluation today patient's wound appears to be doing well with an excellent granulation bed. Unfortunately she does have some swelling in the immediate the sanity of the wound which I fear may be causing some delayed healing. Fortunately there does not appear to be any evidence of infection. 04/13/17; the patient's wound is a lot better than when I last saw this 3 weeks ago. Healthy granulation. The tunneling sites of closed over. As noted last week she does have some swelling especially medially. Therefore I agree with continued compression. We have been using silver collagen 04/20/17; continued improvement in the overall condition of the wound bed. She complains of numbness in the lateral third toes and also some weakness. After further questioning it would appear that this is been [facility], [name] ([id number]) present for a while [not wrap injury]. The original trauma was associated with a deep hematoma, certainly nerve injury here isn't impossible 04/27/17; her wound continues to improve in overall dimensions and condition. She asked today not to have her leg wrap so tightly so she can put on preferred foot wear. Electronic Signature(s) Signed: 04/27/2017 6:05:23  PM By: Baltazar Najjar MD Entered By: Baltazar Najjar on 04/27/2017 17:46:13 Conchas, Sumayyah ([id number]) -------------------------------------------------------------------------------- Physical Exam Details Patient Name: [name] Collum Date of Service: 04/27/2017 3:00 PM Medical Record Patient Account Number: [id number] Number: Treating RN: Clover Mealy, RN, BSN, [place] Sink 03-30-1929 (83 y.o. Other Clinician: Date of Birth/Sex: Female) Treating ROBSON, MICHAEL Primary Care Provider: SYSTEM, PCP Provider/Extender: G Referring Provider: Myrtie Neither in Treatment: 9 Constitutional Patient is hypertensive.. Pulse regular and within target range for patient.Marland Kitchen Respirations regular, non-labored and within target range.. Temperature is normal and within the target range for the patient.Marland Kitchen appears in no distress. Eyes Conjunctivae clear. No discharge. Cardiovascular Pedal pulses palpable and strong bilaterally.. Right leg edema is well controlled. Notes Wound exam; the patient's wound bed was debrided using a #3 curet for some nonviable surface debris and subcutaneous tissue. Post debridement the surface looks extremely healthy and well granulated. There is no surrounding infection this should progress towards closure Electronic Signature(s) Signed: 04/27/2017 6:05:23 PM By: Baltazar Najjar MD Entered By: Baltazar Najjar on 04/27/2017 17:47:17 Kimm, Brayli ([id number]) -------------------------------------------------------------------------------- Physician Orders Details Patient Name: [name] Collum Date of Service: 04/27/2017 3:00 PM Medical Record Patient Account Number: [id number] Number: Treating RN: Clover Mealy, RN, BSN, Shirely [birth year] (83 y.o. Other Clinician: Date of Birth/Sex: Female) Treating ROBSON, MICHAEL Primary Care Provider: SYSTEM, PCP Provider/Extender: G Referring Provider: Myrtie Neither in Treatment: 9 Verbal / Phone Orders: No Diagnosis Coding Wound  Cleansing Wound #1 Right,Anterior Lower Leg o Cleanse wound with mild soap and water o No tub bath. Anesthetic Wound #1 Right,Anterior Lower Leg o Topical Lidocaine 4% cream applied to wound bed prior to debridement Primary Wound  Dressing Wound #1 Right,Anterior Lower Leg o Prisma Ag Secondary Dressing Wound #1 Right,Anterior Lower Leg o ABD pad Dressing Change Frequency Wound #1 Right,Anterior Lower Leg o Change dressing every week Follow-up Appointments Wound #1 Right,Anterior Lower Leg o Return Appointment in 1 week. o Nurse Visit as needed Edema Control o 3 Layer Compression System - Right Lower Extremity - Zinc dome paste to anchor o Elevate legs to the level of the heart and pump ankles as often as possible Additional Orders / Instructions Wound #1 Right,Anterior Lower Leg o Increase protein intake. o Activity as tolerated Flynt, Trinady ([id number]) Electronic Signature(s) Signed: 04/27/2017 6:05:23 PM By: Baltazar Najjar MD Signed: 04/28/2017 1:23:55 PM By: Elpidio Eric BSN, RN Entered By: Elpidio Eric on 04/27/2017 15:33:09 Moylan, Nilza ([id number]) -------------------------------------------------------------------------------- Problem List Details Patient Name: [name] Collum Date of Service: 04/27/2017 3:00 PM Medical Record Patient Account Number: [id number] Number: Treating RN: Clover Mealy, RN, BSN, [place] Sink Aug 26, 1929 (83 y.o. Other Clinician: Date of Birth/Sex: Female) Treating ROBSON, MICHAEL Primary Care Provider: SYSTEM, PCP Provider/Extender: G Referring Provider: Myrtie Neither in Treatment: 9 Active Problems ICD-10 Encounter Code Description Active Date Diagnosis S81.811D Laceration without foreign body, right lower leg, 02/23/2017 Yes subsequent encounter I87.321 Chronic venous hypertension (idiopathic) with 02/23/2017 Yes inflammation of right lower extremity Inactive Problems Resolved Problems Electronic  Signature(s) Signed: 04/27/2017 6:05:23 PM By: Baltazar Najjar MD Entered By: Baltazar Najjar on 04/27/2017 17:45:02 Langland, Lacara ([id number]) -------------------------------------------------------------------------------- Progress Note Details Patient Name: [name] Collum Date of Service: 04/27/2017 3:00 PM Medical Record Patient Account Number: [id number] Number: Treating RN: Clover Mealy, RN, BSN, [place] 06-15-1929 (83 y.o. Other Clinician: Date of Birth/Sex: Female) Treating ROBSON, MICHAEL Primary Care Provider: SYSTEM, PCP Provider/Extender: G Referring Provider: Myrtie Neither in Treatment: 9 Subjective History of Present Illness (HPI) 02/23/17 this is an 83 year old woman who is at a fitness club about to sit down on a rowing machine. She fell and traumatized the anterior part of her right leg on a sharp metal surface. This caused immediate pain and bleeding. She stopped off on her way home at an urgent care ultimately was referred to the ER and by that time she had a significant hematoma of her right leg. She was followed in the urgent orthopedic clinic. She had a duplex ultrasound of the right leg on 5/10 that was negative for DVT x-ray of the right leg on 01/28/17 was negative including a CT scan of the tib-fib. The CT scan did show the subcutaneous hematoma which on 01/28/17 measured 7.7 cm craniocaudal by 2.5 cm x 5.6 cm AP subcutaneous air was also identified felt likely secondary to small lacerations. There was no acute fracture. The patient has been continuing mostly with topical antibiotics on this wound. She did not have any compression. She is referred here from the urgent orthopedic clinic for review of this. She did not have a wound history she is not a diabetic. ABI in this clinic on the right was 1.1 on the left 1.0. She is on aspirin as her only anti-coagulant 03/02/17; the patient arrives for review of a traumatic wound on her right anterior leg with  subsequent hematoma formation and considerable tissue loss on the right anterior lateral leg. We'll use silver alginate last week. We kept her under compression and the patient states that her pain was actually a lot better. Wound measures at 5 x 2.5 x 2. Consider home amount of undermining laterally by roughly 3.5 cm from 7 to 12:00 03/09/17; wound actually looks  better however there is considerable undermining from roughly 7 to 12:00. No evidence of surrounding infection. A snap VAC was applied today 03/17/17; the patient continues to do quite well. There have been some logistic problems with the wound VAC however we have managed to keep this in place. She did fill out one of the canisters but didn't come in in follow-up. We've been using silver collagen under a snap VAC 03/23/17; on intake today the patient appeared to have a new tunnel superiorly in the wound by 4 cm. This is separated from the undermining from roughly 7 or 11:00 she has had chronically. This undermines at 1.8 cm. We've been using silver collagen under a snapVAC 03/26/2017 -- patient of Dr. Leanord Hawkingobson who came in to see me today because she has developed swelling and redness over the area where the snap vacuum was applied and has been having significant discomfort. Culture taken last week is not yet in 04/06/17 on evaluation today patient's wound appears to be doing well with an excellent granulation bed. Unfortunately she does have some swelling in the immediate the sanity of the wound which I fear may be causing some delayed healing. Fortunately there does not appear to be any evidence of infection. 04/13/17; the patient's wound is a lot better than when I last saw this 3 weeks ago. Healthy granulation. The tunneling sites of closed over. As noted last week she does have some swelling especially medially. Therefore I agree with continued compression. We have been using silver collagen 04/20/17; continued improvement in the overall  condition of the wound bed. She complains of numbness in the lateral third toes and also some weakness. After further questioning it would appear that [name] ([id number]) present for a while [not wrap injury]. The original trauma was associated with a deep hematoma, certainly nerve injury here isn't impossible 04/27/17; her wound continues to improve in overall dimensions and condition. She asked today not to have her leg wrap so tightly so she can put on preferred foot wear. Objective Constitutional Patient is hypertensive.. Pulse regular and within target range for patient.Marland Kitchen. Respirations regular, non-labored and within target range.. Temperature is normal and within the target range for the patient.Marland Kitchen. appears in no distress. Vitals Time Taken: 3:05 PM, Height: 64 in, Weight: 138 lbs, BMI: 23.7, Temperature: 97.6 F, Pulse: 70 bpm, Respiratory Rate: 16 breaths/min, Blood Pressure: 171/62 mmHg. Eyes Conjunctivae clear. No discharge. Cardiovascular Pedal pulses palpable and strong bilaterally.. Right leg edema is well controlled. General Notes: Wound exam; the patient's wound bed was debrided using a #3 curet for some nonviable surface debris and subcutaneous tissue. Post debridement the surface looks extremely healthy and well granulated. There is no surrounding infection this should progress towards closure Integumentary (Hair, Skin) Wound #1 status is Open. Original cause of wound was Trauma. The wound is located on the Right,Anterior Lower Leg. The wound measures 0.5cm length x 2.5cm width x 0.4cm depth; 0.982cm^2 area and 0.393cm^3 volume. There is Fat Layer (Subcutaneous Tissue) Exposed exposed. There is no tunneling or undermining noted. There is a medium amount of serous drainage noted. The wound margin is epibole. There is large (67-100%) red granulation within the wound bed. There is a small (1-33%) amount of necrotic tissue within the wound bed including Adherent  Slough. The periwound skin appearance exhibited: Hemosiderin Staining, Mottled. The periwound skin appearance did not exhibit: Callus, Crepitus, Excoriation, Induration, Rash, Scarring, Dry/Scaly, Maceration, Atrophie Blanche, Cyanosis, Ecchymosis, Pallor, Rubor, Erythema. Periwound temperature was  noted as No Abnormality. The periwound has tenderness on palpation. [facility], Michaelyn ([id number]) Assessment Active Problems ICD-10 S81.811D - Laceration without foreign body, right lower leg, subsequent encounter I87.321 - Chronic venous hypertension (idiopathic) with inflammation of right lower extremity Procedures Wound #1 Pre-procedure diagnosis of Wound #1 is a Trauma, Other located on the Right,Anterior Lower Leg . There was a Skin/Subcutaneous Tissue Debridement ([id number]) debridement with total area of 1.25 sq cm performed by Maxwell Caul, MD. with the following instrument(s): Curette to remove Non-Viable tissue/material including Fat Layer (and Subcutaneous Tissue) Exposed, Fibrin/Slough, and Subcutaneous after achieving pain control using Lidocaine 4% Topical Solution. A time out was conducted at 15:30, prior to the start of the procedure. A Minimum amount of bleeding was controlled with Pressure. The procedure was tolerated well with a pain level of 0 throughout and a pain level of 0 following the procedure. Post Debridement Measurements: 0.5cm length x 2.5cm width x 0.4cm depth; 0.393cm^3 volume. Character of Wound/Ulcer Post Debridement is stable. Post procedure Diagnosis Wound #1: Same as Pre-Procedure Plan Wound Cleansing: Wound #1 Right,Anterior Lower Leg: Cleanse wound with mild soap and water No tub bath. Anesthetic: Wound #1 Right,Anterior Lower Leg: Topical Lidocaine 4% cream applied to wound bed prior to debridement Primary Wound Dressing: Wound #1 Right,Anterior Lower Leg: Prisma Ag Secondary Dressing: Wound #1 Right,Anterior Lower Leg: ABD pad Dressing Change  Frequency: Wound #1 Right,Anterior Lower Leg: Change dressing every week Owczarzak, Verley ([id number]) Follow-up Appointments: Wound #1 Right,Anterior Lower Leg: Return Appointment in 1 week. Nurse Visit as needed Edema Control: 3 Layer Compression System - Right Lower Extremity - Zinc dome paste to anchor Elevate legs to the level of the heart and pump ankles as often as possible Additional Orders / Instructions: Wound #1 Right,Anterior Lower Leg: Increase protein intake. Activity as tolerated #1 continue with silver collagen/ABDs/3 layer compression I did not want to reduce the compression around this wound we are making such good progress. #2 follow-up in 1 week Electronic Signature(s) Signed: 04/27/2017 6:05:23 PM By: Baltazar Najjar MD Entered By: Baltazar Najjar on 04/27/2017 17:48:13 Walla, Loralyn ([id number]) -------------------------------------------------------------------------------- SuperBill Details Patient Name: [name] Collum Date of Service: 04/27/2017 Medical Record Patient Account Number: [id number] Number: Treating RN: Clover Mealy, RN, BSN, [place]ish 03/19/1929 (83 y.o. Other Clinician: Date of Birth/Sex: Female) Treating ROBSON, MICHAEL Primary Care Provider: SYSTEM, PCP Provider/Extender: G Referring Provider: Myrtie Neither in Treatment: 9 Diagnosis Coding ICD-10 Codes Code Description S81.811D Laceration without foreign body, right lower leg, subsequent encounter I87.321 Chronic venous hypertension (idiopathic) with inflammation of right lower extremity Facility Procedures CPT4 Code Description: [id number] 11042 - DEB SUBQ TISSUE 20 SQ CM/< ICD-10 Description Diagnosis S81.811D Laceration without foreign body, right lower leg, Modifier: subsequent enc Quantity: 1 ounter Physician Procedures CPT4 Code Description: [id number] 11042 - WC PHYS SUBQ TISS 20 SQ CM ICD-10 Description Diagnosis S81.811D Laceration without foreign body, right lower leg, Modifier:  subsequent enc Quantity: 1 ounter Electronic Signature(s) Signed: 04/27/2017 6:05:23 PM By: Baltazar Najjar MD Entered By: Baltazar Najjar on 04/27/2017 17:48:26

## 2017-04-29 NOTE — Progress Notes (Signed)
[name], [name] ([id number]) Visit Report for 04/27/2017 Arrival Information Details Patient Name: [name], [name] Date of Service: 04/27/2017 3:00 PM Medical Record Number: [id number] Patient Account Number: [id number] Date of Birth/Sex: 10/11/1928 (83 y.o. Female) Treating RN: Afful, RN, BSN, [employer] Primary Care Allyna Pittsley: SYSTEM, PCP Other Clinician: Referring Joandry Slagter: Altamese CabalJONES, MAURICE Treating Lamone Ferrelli/Extender: Altamese CarolinaOBSON, MICHAEL G Weeks in Treatment: 9 Visit Information History Since Last Visit All ordered tests and consults were completed: No Patient Arrived: Gilmer MorCane Added or deleted any medications: No Arrival Time: 15:00 Any new allergies or adverse reactions: No Accompanied By: self Had a fall or experienced change in No Transfer Assistance: None activities of daily living that may affect Patient Identification Verified: Yes risk of falls: Secondary Verification Process Completed: Yes Signs or symptoms of abuse/neglect since last No Patient Requires Transmission-Based No visito Precautions: Hospitalized since last visit: No Patient Has Alerts: No Pain Present Now: No Electronic Signature(s) Signed: 04/28/2017 1:23:55 PM By: Elpidio EricAfful, Marise BSN, RN Entered By: Elpidio EricAfful, Pamula on 04/27/2017 15:00:53 Hightower, [name] ([id number]) -------------------------------------------------------------------------------- Encounter Discharge Information Details Patient Name: [name], [name] Date of Service: 04/27/2017 3:00 PM Medical Record Number: [id number] Patient Account Number: [id number] Date of Birth/Sex: 03/30/1929 (83 y.o. Female) Treating RN: Afful, RN, BSN, [employer] Primary Care Buford Bremer: SYSTEM, PCP Other Clinician: Referring Aniylah Avans: Altamese CabalJONES, MAURICE Treating Deerica Waszak/Extender: Altamese CarolinaOBSON, MICHAEL G Weeks in Treatment: 9 Encounter Discharge Information Items Discharge Pain Level: 0 Discharge Condition: Stable Ambulatory Status: Cane Discharge Destination: Home Transportation: Private Auto Schedule Follow-up  Appointment: No Medication Reconciliation completed No and provided to Patient/Care Leontine Radman: Provided on Clinical Summary of Care: 04/27/2017 Form Type Recipient Paper Patient RB Electronic Signature(s) Signed: 04/27/2017 3:44:08 PM By: Gwenlyn PerkingMoore, Shelia Entered By: Gwenlyn PerkingMoore, Shelia on 04/27/2017 15:44:08 [name], [name] ([id number]) -------------------------------------------------------------------------------- Lower Extremity Assessment Details Patient Name: [name], [name] Date of Service: 04/27/2017 3:00 PM Medical Record Number: [id number] Patient Account Number: [id number] Date of Birth/Sex: 06/20/1929 (83 y.o. Female) Treating RN: Afful, RN, BSN, [employer] Primary Care Cailie Bosshart: SYSTEM, PCP Other Clinician: Referring Haldon Carley: Altamese CabalJONES, MAURICE Treating Aiza Vollrath/Extender: Altamese CarolinaOBSON, MICHAEL G Weeks in Treatment: 9 Edema Assessment Assessed: [Left: No] [Right: No] E[Left: dema] [Right: :] Calf Left: Right: Point of Measurement: 28 cm From Medial Instep cm cm Ankle Left: Right: Point of Measurement: 8 cm From Medial Instep cm cm Vascular Assessment Claudication: Claudication Assessment [Right:None] Pulses: Dorsalis Pedis Palpable: [Right:Yes] Posterior Tibial Extremity colors, hair growth, and conditions: Extremity Color: [Right:Normal] Hair Growth on Extremity: [Right:Yes] Temperature of Extremity: [Right:Warm] Capillary Refill: [Right:< 3 seconds] Toe Nail Assessment Left: Right: Thick: No Discolored: No Deformed: No Improper Length and Hygiene: No Electronic Signature(s) Signed: 04/28/2017 1:23:55 PM By: Elpidio EricAfful, Tarnisha BSN, RN Entered By: Elpidio EricAfful, Myangel on 04/27/2017 15:01:28 Iannaccone, [name] ([id number]) [name] OrionBERMAN, [name] ([id number]) -------------------------------------------------------------------------------- Multi Wound Chart Details Patient Name: [name], [name] Date of Service: 04/27/2017 3:00 PM Medical Record Number: [id number] Patient Account Number: [id number] Date of Birth/Sex:  11/10/1928 (83 y.o. Female) Treating RN: Afful, RN, BSN, [employer] Primary Care Dagon Budai: SYSTEM, PCP Other Clinician: Referring Anthoni Geerts: Altamese CabalJONES, MAURICE Treating Modupe Shampine/Extender: Altamese CarolinaOBSON, MICHAEL G Weeks in Treatment: 9 Vital Signs Height(in): 64 Pulse(bpm): 70 Weight(lbs): 138 Blood Pressure 171/62 (mmHg): Body Mass Index(BMI): 24 Temperature(F): 97.6 Respiratory Rate 16 (breaths/min): Photos: [N/A:N/A] Wound Location: Right Lower Leg - Anterior N/A N/A Wounding Event: Trauma N/A N/A Primary Etiology: Trauma, Other N/A N/A Comorbid History: Cataracts, Arrhythmia, N/A N/A Congestive Heart Failure, Hypertension, Osteoarthritis Date Acquired: 01/28/2017 N/A N/A Weeks of Treatment: 9 N/A N/A Wound Status: Open N/A N/A Measurements L x W x D  0.5x2.5x0.4 N/A N/A (cm) Area (cm) : 0.982 N/A N/A Volume (cm) : 0.393 N/A N/A % Reduction in Area: 90.00% N/A N/A % Reduction in Volume: 98.70% N/A N/A Classification: Full Thickness With N/A N/A Exposed Support Structures Exudate Amount: Medium N/A N/A Exudate Type: Serous N/A N/A Exudate Color: amber N/A N/A Wound Margin: Epibole N/A N/A Baer, [name] ([id number]) Granulation Amount: Large (67-100%) N/A N/A Granulation Quality: Red, Hyper-granulation N/A N/A Necrotic Amount: Small (1-33%) N/A N/A Exposed Structures: Fat Layer (Subcutaneous N/A N/A Tissue) Exposed: Yes Fascia: No Tendon: No Muscle: No Joint: No Bone: No Epithelialization: Medium (34-66%) N/A N/A Debridement: Debridement (40981- N/A N/A 11047) Pre-procedure 15:30 N/A N/A Verification/Time Out Taken: Pain Control: Lidocaine 4% Topical N/A N/A Solution Tissue Debrided: Fibrin/Slough, Fat, N/A N/A Subcutaneous Level: Skin/Subcutaneous N/A N/A Tissue Debridement Area (sq 1.25 N/A N/A cm): Instrument: Curette N/A N/A Bleeding: Minimum N/A N/A Hemostasis Achieved: Pressure N/A N/A Procedural Pain: 0 N/A N/A Post Procedural Pain: 0 N/A N/A Debridement  Treatment Procedure was tolerated N/A N/A Response: well Post Debridement 0.5x2.5x0.4 N/A N/A Measurements L x W x D (cm) Post Debridement 0.393 N/A N/A Volume: (cm) Periwound Skin Texture: Excoriation: No N/A N/A Induration: No Callus: No Crepitus: No Rash: No Scarring: No Periwound Skin Maceration: No N/A N/A Moisture: Dry/Scaly: No Periwound Skin Color: Hemosiderin Staining: Yes N/A N/A Mottled: Yes Atrophie Blanche: No Cyanosis: No Ecchymosis: No Erythema: No Heng, Emmilyn ([id number]) Pallor: No Rubor: No Temperature: No Abnormality N/A N/A Tenderness on Yes N/A N/A Palpation: Wound Preparation: Ulcer Cleansing: N/A N/A Rinsed/Irrigated with Saline, Other: soap and water Topical Anesthetic Applied: None, Other: lidocaine 4% Procedures Performed: Debridement N/A N/A Treatment Notes Wound #1 (Right, Anterior Lower Leg) 1. Cleansed with: Cleanse wound with antibacterial soap and water No shower or tub bath May shower with protection 3. Peri-wound Care: Barrier cream Moisturizing lotion 4. Dressing Applied: Prisma Ag 5. Secondary Dressing Applied ABD Pad 7. Secured with 3 Layer Compression System - Right Lower Extremity Electronic Signature(s) Signed: 04/27/2017 6:05:23 PM By: Baltazar Najjar MD Entered By: Baltazar Najjar on 04/27/2017 17:45:08 Beutler, Davan ([id number]) -------------------------------------------------------------------------------- Multi-Disciplinary Care Plan Details Patient Name: [name] Collum Date of Service: 04/27/2017 3:00 PM Medical Record Number: [id number] Patient Account Number: [id number] Date of Birth/Sex: 09/09/1929 (83 y.o. Female) Treating RN: Afful, RN, BSN, [employer] Primary Care Giordan Fordham: SYSTEM, PCP Other Clinician: Referring Ayyan Sites: Altamese Cabal Treating Woodson Macha/Extender: Altamese [place] in Treatment: 9 Active Inactive ` Abuse / Safety / Falls / Self Care Management Nursing Diagnoses: History of  Falls Impaired home maintenance Impaired physical mobility Potential for falls Potential for injury related to transfers Goals: Patient will not develop complications from immobility Date Initiated: 02/23/2017 Target Resolution Date: 07/26/2017 Goal Status: Active Patient will not experience any injury related to falls Date Initiated: 02/23/2017 Target Resolution Date: 07/26/2017 Goal Status: Active Patient will remain injury free related to falls Date Initiated: 02/23/2017 Target Resolution Date: 07/26/2017 Goal Status: Active Patient/caregiver will demonstrate safe use of adaptive devices to increase mobility Date Initiated: 02/23/2017 Target Resolution Date: 07/26/2017 Goal Status: Active Patient/caregiver will identify factors that restrict self-care and home management Date Initiated: 02/23/2017 Target Resolution Date: 07/26/2017 Goal Status: Active Patient/caregiver will verbalize understanding of skin care regimen Date Initiated: 02/23/2017 Target Resolution Date: 07/26/2017 Goal Status: Active Patient/caregiver will verbalize understanding of the importance to maintain current immunizations/vaccinations Date Initiated: 02/23/2017 Target Resolution Date: 07/26/2017 Goal Status: Active Patient/caregiver will verbalize/demonstrate measure taken to improve self care Date Initiated: 02/23/2017 Target Resolution  Date: 07/26/2017 [name], [name] ([id number]) Goal Status: Active Patient/caregiver will verbalize/demonstrate measures taken to improve the patient's personal safety Date Initiated: 02/23/2017 Target Resolution Date: 07/26/2017 Goal Status: Active Patient/caregiver will verbalize/demonstrate measures taken to prevent injury and/or falls Date Initiated: 02/23/2017 Target Resolution Date: 07/26/2017 Goal Status: Active Patient/caregiver will verbalize/demonstrate understanding of what to do in case of emergency Date Initiated: 02/23/2017 Target Resolution Date:  07/26/2017 Goal Status: Active Interventions: Call light and/or bell within patient's reach Assess Activities of Daily Living upon admission and as needed Assess fall risk on admission and as needed Assess: immobility, friction, shearing, incontinence upon admission and as needed Assess impairment of mobility on admission and as needed per policy Assess personal safety and home safety (as indicated) on admission and as needed Assess self care needs on admission and as needed Provide education on basic hygiene Provide education on fall prevention Provide education on personal and home safety Provide education on safe transfers Treatment Activities: Education provided on Basic Hygiene : 04/20/2017 Notes: ` Orientation to the Wound Care Program Nursing Diagnoses: Knowledge deficit related to the wound healing center program Goals: Patient/caregiver will verbalize understanding of the Wound Healing Center Program Date Initiated: 02/23/2017 Target Resolution Date: 07/26/2017 Goal Status: Active Interventions: Provide education on orientation to the wound center Notes: [name]` Weidner, Norene ([id number]) Wound/Skin Impairment Nursing Diagnoses: Impaired tissue integrity Knowledge deficit related to ulceration/compromised skin integrity Goals: Patient/caregiver will verbalize understanding of skin care regimen Date Initiated: 02/23/2017 Target Resolution Date: 07/26/2017 Goal Status: Active Ulcer/skin breakdown will have a volume reduction of 30% by week 4 Date Initiated: 02/23/2017 Target Resolution Date: 07/26/2017 Goal Status: Active Ulcer/skin breakdown will have a volume reduction of 50% by week 8 Date Initiated: 02/23/2017 Target Resolution Date: 07/26/2017 Goal Status: Active Ulcer/skin breakdown will have a volume reduction of 80% by week 12 Date Initiated: 02/23/2017 Target Resolution Date: 07/26/2017 Goal Status: Active Ulcer/skin breakdown will heal within 14 weeks Date  Initiated: 02/23/2017 Target Resolution Date: 07/26/2017 Goal Status: Active Interventions: Assess patient/caregiver ability to obtain necessary supplies Assess patient/caregiver ability to perform ulcer/skin care regimen upon admission and as needed Assess ulceration(s) every visit Provide education on ulcer and skin care Treatment Activities: Referred to DME Fatimah Sundquist for dressing supplies : 02/23/2017 Skin care regimen initiated : 02/23/2017 Topical wound management initiated : 02/23/2017 Notes: Electronic Signature(s) Signed: 04/28/2017 1:23:55 PM By: Elpidio EricAfful, Katonya BSN, RN Entered By: Elpidio EricAfful, Winefred on 04/27/2017 15:31:21 Bechard, Nariah ([id number]) -------------------------------------------------------------------------------- Pain Assessment Details Patient Name: [name], [place] Date of Service: 04/27/2017 3:00 PM Medical Record Number: [id number] Patient Account Number: [id number] Date of Birth/Sex: 08/09/1929 (83 y.o. Female) Treating RN: Afful, RN, BSN, [employer] Primary Care Kyri Dai: SYSTEM, PCP Other Clinician: Referring Marvia Troost: Altamese CabalJONES, MAURICE Treating Fardowsa Authier/Extender: Altamese CarolinaOBSON, MICHAEL G Weeks in Treatment: 9 Active Problems Location of Pain Severity and Description of Pain Patient Has Paino No Site Locations With Dressing Change: No Pain Management and Medication Current Pain Management: Electronic Signature(s) Signed: 04/28/2017 1:23:55 PM By: Elpidio EricAfful, Huma BSN, RN Entered By: Elpidio EricAfful, [name] on 04/27/2017 15:01:00 Guthmiller, Temperence ([id number]) -------------------------------------------------------------------------------- Patient/Caregiver Education Details Patient Name: [name], Trenita Date of Service: 04/27/2017 3:00 PM Medical Record Patient Account Number: [id number] Number: Treating RN: Clover MealyAfful, RN, BSN, [place] 12/10/1928 (83 y.o. Other Clinician: Date of Birth/Gender: Female) Treating ROBSON, MICHAEL Primary Care Physician: SYSTEM, PCP Physician/Extender: G Referring Physician:  Myrtie NeitherJONES, MAURICE Weeks in Treatment: 9 Education Assessment Education Provided To: Patient Education Topics Provided Basic Hygiene: Methods: Explain/Verbal Responses: State content correctly Safety: Methods: Explain/Verbal Responses:  State content correctly Welcome To The [facility] Center: Methods: Explain/Verbal Responses: State content correctly Wound/Skin Impairment: Methods: Explain/Verbal Responses: State content correctly Electronic Signature(s) Signed: 04/28/2017 1:23:55 PM By: Elpidio Eric BSN, RN Entered By: Elpidio Eric on 04/27/2017 15:43:21 Kleine, Idaly ([id number]) -------------------------------------------------------------------------------- Wound Assessment Details Patient Name: [name] Collum Date of Service: 04/27/2017 3:00 PM Medical Record Number: [id number] Patient Account Number: [id number] Date of Birth/Sex: 1929-03-16 (83 y.o. Female) Treating RN: Afful, RN, BSN, [employer] Primary Care Maryon Kemnitz: SYSTEM, PCP Other Clinician: Referring Oyinkansola Truax: Altamese Cabal Treating Damika Harmon/Extender: Altamese [place] in Treatment: 9 Wound Status Wound Number: 1 Primary Trauma, Other Etiology: Wound Location: Right Lower Leg - Anterior Wound Open Wounding Event: Trauma Status: Date Acquired: 01/28/2017 Comorbid Cataracts, Arrhythmia, Congestive Weeks Of Treatment: 9 History: Heart Failure, Hypertension, Clustered Wound: No Osteoarthritis Photos Photo Uploaded By: Elpidio Eric on 04/27/2017 15:56:15 Wound Measurements Length: (cm) 0.5 Width: (cm) 2.5 Depth: (cm) 0.4 Area: (cm) 0.982 Volume: (cm) 0.393 % Reduction in Area: 90% % Reduction in Volume: 98.7% Epithelialization: Medium (34-66%) Tunneling: No Undermining: No Wound Description Full Thickness With Exposed Classification: Support Structures Wound Margin: Epibole Exudate Medium Amount: Exudate Type: Serous Exudate Color: amber Foul Odor After Cleansing: No Slough/Fibrino Yes Wound  Bed Granulation Amount: Large (67-100%) Exposed Structure Granulation Quality: Red, Hyper-granulation Fascia Exposed: No Sotomayor, Aalayah ([id number]) Necrotic Amount: Small (1-33%) Fat Layer (Subcutaneous Tissue) Exposed: Yes Necrotic Quality: Adherent Slough Tendon Exposed: No Muscle Exposed: No Joint Exposed: No Bone Exposed: No Periwound Skin Texture Texture Color No Abnormalities Noted: No No Abnormalities Noted: No Callus: No Atrophie Blanche: No Crepitus: No Cyanosis: No Excoriation: No Ecchymosis: No Induration: No Erythema: No Rash: No Hemosiderin Staining: Yes Scarring: No Mottled: Yes Pallor: No Moisture Rubor: No No Abnormalities Noted: No Dry / Scaly: No Temperature / Pain Maceration: No Temperature: No Abnormality Tenderness on Palpation: Yes Wound Preparation Ulcer Cleansing: Rinsed/Irrigated with Saline, Other: soap and water, Topical Anesthetic Applied: None, Other: lidocaine 4%, Treatment Notes Wound #1 (Right, Anterior Lower Leg) 1. Cleansed with: Cleanse wound with antibacterial soap and water No shower or tub bath May shower with protection 3. Peri-wound Care: Barrier cream Moisturizing lotion 4. Dressing Applied: Prisma Ag 5. Secondary Dressing Applied ABD Pad 7. Secured with 3 Layer Compression System - Right Lower Extremity Electronic Signature(s) Signed: 04/28/2017 1:23:55 PM By: Elpidio Eric BSN, RN Entered By: Elpidio Eric on 04/27/2017 15:12:33 Leaf, Josselyn ([id number]) -------------------------------------------------------------------------------- Vitals Details Patient Name: [name] Collum Date of Service: 04/27/2017 3:00 PM Medical Record Number: [id number] Patient Account Number: [id number] Date of Birth/Sex: 04-30-1929 (83 y.o. Female) Treating RN: Afful, RN, BSN, [employer] Primary Care Atthew Coutant: SYSTEM, PCP Other Clinician: Referring Yashas Camilli: Altamese Cabal Treating Cashae Weich/Extender: Altamese [place] in Treatment: 9 Vital  Signs Time Taken: 15:05 Temperature (F): 97.6 Height (in): 64 Pulse (bpm): 70 Weight (lbs): 138 Respiratory Rate (breaths/min): 16 Body Mass Index (BMI): 23.7 Blood Pressure (mmHg): 171/62 Reference Range: 80 - 120 mg / dl Electronic Signature(s) Signed: 04/28/2017 1:23:55 PM By: Elpidio Eric BSN, RN Entered By: Elpidio Eric on 04/27/2017 15:07:09

## 2017-05-04 ENCOUNTER — Encounter: Payer: Medicare Other | Attending: Physician Assistant | Admitting: Physician Assistant

## 2017-05-04 DIAGNOSIS — I87321 Chronic venous hypertension (idiopathic) with inflammation of right lower extremity: Secondary | ICD-10-CM | POA: Diagnosis not present

## 2017-05-04 DIAGNOSIS — I110 Hypertensive heart disease with heart failure: Secondary | ICD-10-CM | POA: Diagnosis not present

## 2017-05-04 DIAGNOSIS — W19XXXD Unspecified fall, subsequent encounter: Secondary | ICD-10-CM | POA: Diagnosis not present

## 2017-05-04 DIAGNOSIS — I509 Heart failure, unspecified: Secondary | ICD-10-CM | POA: Insufficient documentation

## 2017-05-04 DIAGNOSIS — S81811D Laceration without foreign body, right lower leg, subsequent encounter: Secondary | ICD-10-CM | POA: Diagnosis not present

## 2017-05-04 DIAGNOSIS — M1990 Unspecified osteoarthritis, unspecified site: Secondary | ICD-10-CM | POA: Diagnosis not present

## 2017-05-04 DIAGNOSIS — Z7982 Long term (current) use of aspirin: Secondary | ICD-10-CM | POA: Diagnosis not present

## 2017-05-11 ENCOUNTER — Encounter: Payer: Medicare Other | Admitting: Physician Assistant

## 2017-05-11 DIAGNOSIS — I87321 Chronic venous hypertension (idiopathic) with inflammation of right lower extremity: Secondary | ICD-10-CM | POA: Diagnosis not present

## 2017-05-13 NOTE — Progress Notes (Signed)
[name], [name] ([id number]) Visit Report for 05/04/2017 Chief Complaint Document Details Patient Name: [name], [name] Date of Service: 05/04/2017 2:00 PM Medical Record Number: [id number] Patient Account Number: [id number] Date of Birth/Sex: 07-Jul-1929 (83 y.o. Female) Treating RN: Huel Coventry Primary Care Provider: SYSTEM, PCP Other Clinician: Referring Provider: Altamese Cabal Treating Provider/Extender: Linwood Dibbles, HOYT Weeks in Treatment: 10 Information Obtained from: Patient Chief Complaint 02/23/17; patient is here for review of wound on the right anterior lower leg Electronic Signature(s) Signed: 05/11/2017 5:50:39 PM By: Elliot Gurney, BSN, RN, CWS, Kim RN, BSN Signed: 05/12/2017 1:25:22 AM By: Lenda Kelp PA-C Previous Signature: 05/04/2017 5:26:05 PM Version By: Lenda Kelp PA-C Entered By: Elliot Gurney, BSN, RN, CWS, Kim on 05/11/2017 17:48:56 [name], [name] ([id number]) -------------------------------------------------------------------------------- HPI Details Patient Name: [name] Date of Service: 05/04/2017 2:00 PM Medical Record Number: [id number] Patient Account Number: [id number] Date of Birth/Sex: 1929-06-06 (83 y.o. Female) Treating RN: Huel Coventry Primary Care Provider: SYSTEM, PCP Other Clinician: Referring Provider: Altamese Cabal Treating Provider/Extender: Linwood Dibbles, HOYT Weeks in Treatment: 10 History of Present Illness HPI Description: 02/23/17 this is an 83 year old woman who is at a fitness club about to sit down on a rowing machine. She fell and traumatized the anterior part of her right leg on a sharp metal surface. This caused immediate pain and bleeding. She stopped off on her way home at an urgent care ultimately was referred to the ER and by that time she had a significant hematoma of her right leg. She was followed in the urgent orthopedic clinic. She had a duplex ultrasound of the right leg on 5/10 that was negative for DVT x-ray of the right leg on 01/28/17 was negative  including a CT scan of the tib-fib. The CT scan did show the subcutaneous hematoma which on 01/28/17 measured 7.7 cm craniocaudal by 2.5 cm x 5.6 cm AP subcutaneous air was also identified felt likely secondary to small lacerations. There was no acute fracture. The patient has been continuing mostly with topical antibiotics on this wound. She did not have any compression. She is referred here from the urgent orthopedic clinic for review of this. She did not have a wound history she is not a diabetic. ABI in this clinic on the right was 1.1 on the left 1.0. She is on aspirin as her only anti-coagulant 03/02/17; the patient arrives for review of a traumatic wound on her right anterior leg with subsequent hematoma formation and considerable tissue loss on the right anterior lateral leg. We'll use silver alginate last week. We kept her under compression and the patient states that her pain was actually a lot better. Wound measures at 5 x 2.5 x 2. Consider home amount of undermining laterally by roughly 3.5 cm from 7 to 12:00 03/09/17; wound actually looks better however there is considerable undermining from roughly 7 to 12:00. No evidence of surrounding infection. A snap VAC was applied today 03/17/17; the patient continues to do quite well. There have been some logistic problems with the wound VAC however we have managed to keep this in place. She did fill out one of the canisters but didn't come in in follow-up. We've been using silver collagen under a snap VAC 03/23/17; on intake today the patient appeared to have a new tunnel superiorly in the wound by 4 cm. This is separated from the undermining from roughly 7 or 11:00 she has had chronically. This undermines at 1.8 cm. We've been using silver collagen under a snapVAC 03/26/2017 -- patient  of Dr. Leanord Hawking who came in to see me today because she has developed swelling and redness over the area where the snap vacuum was applied and has been having  significant discomfort. Culture taken last week is not yet in 04/06/17 on evaluation today patient's wound appears to be doing well with an excellent granulation bed. Unfortunately she does have some swelling in the immediate the sanity of the wound which I fear may be causing some delayed healing. Fortunately there does not appear to be any evidence of infection. 04/13/17; the patient's wound is a lot better than when I last saw this 3 weeks ago. Healthy granulation. The tunneling sites of closed over. As noted last week she does have some swelling especially medially. Therefore I agree with continued compression. We have been using silver collagen 04/20/17; continued improvement in the overall condition of the wound bed. She complains of numbness in the lateral third toes and also some weakness. After further questioning it would appear that this is been present for a while [not wrap injury]. The original trauma was associated with a deep hematoma, certainly nerve injury here isn't impossible [facility], Clarinda ([id number]) 04/27/17; her wound continues to improve in overall dimensions and condition. She asked today not to have her leg wrap so tightly so she can put on preferred foot wear. 05/04/17 on evaluation today patient's right lower extremity wound appears to be doing very well. She is tolerating the compression and feels like that she would love to have this off but if it has to be on in order to continue to treat her wounds she will continue to work this. Fortunately she has no discomfort at this point. She has no nausea, vomiting, or diarrhea and no fever chills. Electronic Signature(s) Signed: 05/11/2017 5:50:39 PM By: Elliot Gurney, BSN, RN, CWS, Kim RN, BSN Signed: 05/12/2017 1:25:22 AM By: Lenda Kelp PA-C Previous Signature: 05/04/2017 5:26:05 PM Version By: Lenda Kelp PA-C Entered By: Elliot Gurney BSN, RN, CWS, Kim on 05/11/2017 17:49:03 [place], [name]  ([id number]) -------------------------------------------------------------------------------- Physical Exam Details Patient Name: [name] Date of Service: 05/04/2017 2:00 PM Medical Record Number: [id number] Patient Account Number: [id number] Date of Birth/Sex: 1928-12-11 (83 y.o. Female) Treating RN: Huel Coventry Primary Care Provider: SYSTEM, PCP Other Clinician: Referring Provider: Altamese Cabal Treating Provider/Extender: STONE III, HOYT Weeks in Treatment: 10 Constitutional Well-nourished and well-hydrated in no acute distress. Respiratory normal breathing without difficulty. clear to auscultation bilaterally. Cardiovascular regular rate and rhythm with normal S1, S2. Psychiatric this patient is able to make decisions and demonstrates good insight into disease process. Alert and Oriented x 3. pleasant and cooperative. Notes Patient's wound has excellent granulation bed after cleansing on evaluation today. She has significant epithelium migrating over the wound and this is improving an excellent fashion in my opinion. Electronic Signature(s) Signed: 05/11/2017 5:50:39 PM By: Elliot Gurney, BSN, RN, CWS, Kim RN, BSN Signed: 05/12/2017 1:25:22 AM By: Lenda Kelp PA-C Previous Signature: 05/04/2017 5:26:05 PM Version By: Lenda Kelp PA-C Entered By: Elliot Gurney BSN, RN, CWS, Kim on 05/11/2017 17:49:12 [name], [name] ([id number]) -------------------------------------------------------------------------------- Physician Orders Details Patient Name: [name] Date of Service: 05/04/2017 2:00 PM Medical Record Number: [id number] Patient Account Number: [id number] Date of Birth/Sex: 06-Nov-1928 (83 y.o. Female) Treating RN: Ashok Cordia, Debi Primary Care Provider: SYSTEM, PCP Other Clinician: Referring Provider: Altamese Cabal Treating Provider/Extender: Linwood Dibbles, HOYT Weeks in Treatment: 10 Verbal / Phone Orders: Yes Clinician: Ashok Cordia, Debi Read Back and Verified: Yes Diagnosis  Coding ICD-10 Coding Code Description [phone]  Laceration without foreign body, right lower leg, subsequent encounter I87.321 Chronic venous hypertension (idiopathic) with inflammation of right lower extremity Wound Cleansing Wound #1 Right,Anterior Lower Leg o Cleanse wound with mild soap and water o No tub bath. Anesthetic Wound #1 Right,Anterior Lower Leg o Topical Lidocaine 4% cream applied to wound bed prior to debridement Primary Wound Dressing Wound #1 Right,Anterior Lower Leg o Prisma Ag Secondary Dressing Wound #1 Right,Anterior Lower Leg o ABD pad Dressing Change Frequency Wound #1 Right,Anterior Lower Leg o Change dressing every week Follow-up Appointments Wound #1 Right,Anterior Lower Leg o Return Appointment in 1 week. o Nurse Visit as needed Edema Control o 3 Layer Compression System - Right Lower Extremity - Zinc dome paste to anchor o Elevate legs to the level of the heart and pump ankles as often as possible [place], [name] ([id number]) Additional Orders / Instructions Wound #1 Right,Anterior Lower Leg o Increase protein intake. o Activity as tolerated Notes I'm going to recommend that we continue with the Current wound care measures per the above orders for the next week. I did discuss with patient that I would recommend that we continue her compression wraps as well since this seems to be of benefit for this wound and the healing process. If anything worsens in the interim patient will contact our office for additional recommendations. Otherwise I will see her for reevaluation in one week. Electronic Signature(s) Signed: 05/11/2017 5:50:39 PM By: Elliot Gurney, BSN, RN, CWS, Kim RN, BSN Signed: 05/12/2017 1:25:22 AM By: Lenda Kelp PA-C Previous Signature: 05/04/2017 5:26:05 PM Version By: Lenda Kelp PA-C Entered By: Elliot Gurney BSN, RN, CWS, Kim on 05/11/2017 17:48:17 [place], [name]  ([id number]) -------------------------------------------------------------------------------- Problem List Details Patient Name: [name] Date of Service: 05/04/2017 2:00 PM Medical Record Number: [id number] Patient Account Number: [id number] Date of Birth/Sex: [birth year] (83 y.o. Female) Treating RN: Huel Coventry Primary Care Provider: SYSTEM, PCP Other Clinician: Referring Provider: Altamese Cabal Treating Provider/Extender: Linwood Dibbles, HOYT Weeks in Treatment: 10 Active Problems ICD-10 Encounter Code Description Active Date Diagnosis S81.811D Laceration without foreign body, right lower leg, 02/23/2017 Yes subsequent encounter I87.321 Chronic venous hypertension (idiopathic) with 02/23/2017 Yes inflammation of right lower extremity Inactive Problems Resolved Problems Electronic Signature(s) Signed: 05/11/2017 5:50:39 PM By: Elliot Gurney, BSN, RN, CWS, Kim RN, BSN Signed: 05/12/2017 1:25:22 AM By: Lenda Kelp PA-C Previous Signature: 05/04/2017 5:26:05 PM Version By: Lenda Kelp PA-C Entered By: Elliot Gurney, BSN, RN, CWS, Kim on 05/11/2017 17:48:42 [place], [name] ([id number]) -------------------------------------------------------------------------------- Progress Note Details Patient Name: [name] Date of Service: 05/04/2017 2:00 PM Medical Record Number: [id number] Patient Account Number: [id number] Date of Birth/Sex: Aug 27, 1929 (83 y.o. Female) Treating RN: Huel Coventry Primary Care Provider: SYSTEM, PCP Other Clinician: Referring Provider: Altamese Cabal Treating Provider/Extender: Linwood Dibbles, HOYT Weeks in Treatment: 10 Subjective Chief Complaint Information obtained from Patient 02/23/17; patient is here for review of wound on the right anterior lower leg History of Present Illness (HPI) 02/23/17 this is an 83 year old woman who is at a fitness club about to sit down on a rowing machine. She fell and traumatized the anterior part of her right leg on a sharp metal surface. This caused  immediate pain and bleeding. She stopped off on her way home at an urgent care ultimately was referred to the ER and by that time she had a significant hematoma of her right leg. She was followed in the urgent orthopedic clinic. She had a duplex ultrasound of the right leg on 5/10 that was negative for  DVT x-ray of the right leg on 01/28/17 was negative including a CT scan of the tib-fib. The CT scan did show the subcutaneous hematoma which on 01/28/17 measured 7.7 cm craniocaudal by 2.5 cm x 5.6 cm AP subcutaneous air was also identified felt likely secondary to small lacerations. There was no acute fracture. The patient has been continuing mostly with topical antibiotics on this wound. She did not have any compression. She is referred here from the urgent orthopedic clinic for review of this. She did not have a wound history she is not a diabetic. ABI in this clinic on the right was 1.1 on the left 1.0. She is on aspirin as her only anti-coagulant 03/02/17; the patient arrives for review of a traumatic wound on her right anterior leg with subsequent hematoma formation and considerable tissue loss on the right anterior lateral leg. We'll use silver alginate last week. We kept her under compression and the patient states that her pain was actually a lot better. Wound measures at 5 x 2.5 x 2. Consider home amount of undermining laterally by roughly 3.5 cm from 7 to 12:00 03/09/17; wound actually looks better however there is considerable undermining from roughly 7 to 12:00. No evidence of surrounding infection. A snap VAC was applied today 03/17/17; the patient continues to do quite well. There have been some logistic problems with the wound VAC however we have managed to keep this in place. She did fill out one of the canisters but didn't come in in follow-up. We've been using silver collagen under a snap VAC 03/23/17; on intake today the patient appeared to have a new tunnel superiorly in the wound by 4  cm. This is separated from the undermining from roughly 7 or 11:00 she has had chronically. This undermines at 1.8 cm. We've been using silver collagen under a snapVAC 03/26/2017 -- patient of Dr. Leanord Hawking who came in to see me today because she has developed swelling and redness over the area where the snap vacuum was applied and has been having significant discomfort. Culture taken last week is not yet in 04/06/17 on evaluation today patient's wound appears to be doing well with an excellent granulation bed. Unfortunately she does have some swelling in the immediate the sanity of the wound which I fear may be causing some delayed healing. Fortunately there does not appear to be any evidence of infection. 04/13/17; the patient's wound is a lot better than when I last saw this 3 weeks ago. Healthy granulation. The tunneling sites of closed over. As noted last week she does have some swelling especially medially. [facility], [name] ([id number]) Therefore I agree with continued compression. We have been using silver collagen 04/20/17; continued improvement in the overall condition of the wound bed. She complains of numbness in the lateral third toes and also some weakness. After further questioning it would appear that this is been present for a while [not wrap injury]. The original trauma was associated with a deep hematoma, certainly nerve injury here isn't impossible 04/27/17; her wound continues to improve in overall dimensions and condition. She asked today not to have her leg wrap so tightly so she can put on preferred foot wear. 05/04/17 on evaluation today patient's right lower extremity wound appears to be doing very well. She is tolerating the compression and feels like that she would love to have this off but if it has to be on in order to continue to treat her wounds she will continue to work this. Fortunately  she has no discomfort at this point. She has no nausea, vomiting, or diarrhea and no fever  chills. Objective Constitutional Well-nourished and well-hydrated in no acute distress. Vitals Time Taken: 2:02 PM, Height: 64 in, Weight: 138 lbs, BMI: 23.7, Temperature: 97.5 F, Pulse: 75 bpm, Respiratory Rate: 16 breaths/min, Blood Pressure: 142/86 mmHg. Respiratory normal breathing without difficulty. clear to auscultation bilaterally. Cardiovascular regular rate and rhythm with normal S1, S2. Psychiatric this patient is able to make decisions and demonstrates good insight into disease process. Alert and Oriented x 3. pleasant and cooperative. General Notes: Patient's wound has excellent granulation bed after cleansing on evaluation today. She has significant epithelium migrating over the wound and this is improving an excellent fashion in my opinion. Integumentary (Hair, Skin) Wound #1 status is Open. Original cause of wound was Trauma. The wound is located on the Right,Anterior Lower Leg. The wound measures 0.1cm length x 0.3cm width x 0.3cm depth; 0.024cm^2 area and 0.007cm^3 volume. There is Fat Layer (Subcutaneous Tissue) Exposed exposed. There is no tunneling or undermining noted. There is a medium amount of serous drainage noted. The wound margin is epibole. There is large (67-100%) red granulation within the wound bed. There is a small (1-33%) amount of necrotic tissue within the wound bed including Adherent Slough. The periwound skin appearance exhibited: [place], [name] ([id number]) Hemosiderin Staining, Mottled. The periwound skin appearance did not exhibit: Callus, Crepitus, Excoriation, Induration, Rash, Scarring, Dry/Scaly, Maceration, Atrophie Blanche, Cyanosis, Ecchymosis, Pallor, Rubor, Erythema. Periwound temperature was noted as No Abnormality. The periwound has tenderness on palpation. Assessment Active Problems ICD-10 S81.811D - Laceration without foreign body, right lower leg, subsequent encounter I87.321 - Chronic venous hypertension (idiopathic) with  inflammation of right lower extremity Plan Wound Cleansing: Wound #1 Right,Anterior Lower Leg: Cleanse wound with mild soap and water No tub bath. Anesthetic: Wound #1 Right,Anterior Lower Leg: Topical Lidocaine 4% cream applied to wound bed prior to debridement Primary Wound Dressing: Wound #1 Right,Anterior Lower Leg: Prisma Ag Secondary Dressing: Wound #1 Right,Anterior Lower Leg: ABD pad Dressing Change Frequency: Wound #1 Right,Anterior Lower Leg: Change dressing every week Follow-up Appointments: Wound #1 Right,Anterior Lower Leg: Return Appointment in 1 week. Nurse Visit as needed Edema Control: 3 Layer Compression System - Right Lower Extremity - Zinc dome paste to anchor Elevate legs to the level of the heart and pump ankles as often as possible Additional Orders / Instructions: Wound #1 Right,Anterior Lower Leg: Increase protein intake. Activity as tolerated [name], [name] ([id number]) General Notes: I'm going to recommend that we continue with the Current wound care measures per the above orders for the next week. I did discuss with patient that I would recommend that we continue her compression wraps as well since this seems to be of benefit for this wound and the healing process. If anything worsens in the interim patient will contact our office for additional recommendations. Otherwise I will see her for reevaluation in one week. Electronic Signature(s) Signed: 05/11/2017 5:50:39 PM By: Elliot GurneyWoody, BSN, RN, CWS, Kim RN, BSN Signed: 05/12/2017 1:25:22 AM By: Lenda KelpStone III, Hoyt PA-C Previous Signature: 05/04/2017 5:26:05 PM Version By: Lenda KelpStone III, Hoyt PA-C Entered By: Elliot GurneyWoody, BSN, RN, CWS, Kim on 05/11/2017 17:49:29 [name], [name] ([id number]) -------------------------------------------------------------------------------- SuperBill Details Patient Name: [name] CollumBERMAN, [place]ah Date of Service: 05/04/2017 Medical Record Number: [id number] Patient Account Number: [id number] Date of Birth/Sex:  07/29/1929 (83 y.o. Female) Treating RN: Huel CoventryWoody, Kim Primary Care Provider: SYSTEM, PCP Other Clinician: Referring Provider: Altamese CabalJONES, MAURICE Treating Provider/Extender: STONE III, HOYT Weeks in  Treatment: 10 Diagnosis Coding ICD-10 Codes Code Description S81.811D Laceration without foreign body, right lower leg, subsequent encounter I87.321 Chronic venous hypertension (idiopathic) with inflammation of right lower extremity Facility Procedures CPT4: Description Modifier Quantity Code [id number] (Facility Use Only) [id number] - APPLY MULTLAY COMPRS LWR RT 1 LEG Physician Procedures CPT4: Description Modifier Quantity Code [id number] - WC PHYS LEVEL 3 - EST PT 1 ICD-10 Description Diagnosis S81.811D Laceration without foreign body, right lower leg, subsequent encounter I87.321 Chronic venous hypertension (idiopathic) with  inflammation of right lower extremity Electronic Signature(s) Signed: 05/11/2017 5:50:39 PM By: Elliot Gurney, BSN, RN, CWS, Kim RN, BSN Signed: 05/12/2017 1:25:22 AM By: Lenda Kelp PA-C Previous Signature: 05/04/2017 5:39:54 PM Version By: Alejandro Mulling Entered By: Elliot Gurney, BSN, RN, CWS, Kim on 05/11/2017 17:48:34

## 2017-05-13 NOTE — Progress Notes (Signed)
[name], [name] ([id number]) Visit Report for 05/11/2017 Chief Complaint Document Details Patient Name: [name], [name] Date of Service: 05/11/2017 2:00 PM Medical Record Number: [id number] Patient Account Number: [id number] Date of Birth/Sex: 1929-03-16 (83 y.o. Female) Treating RN: Huel Coventry Primary Care Provider: SYSTEM, PCP Other Clinician: Referring Provider: Altamese Cabal Treating Provider/Extender: Linwood Dibbles, Durrel Mcnee Weeks in Treatment: 11 Information Obtained from: Patient Chief Complaint 02/23/17; patient is here for review of wound on the right anterior lower leg Electronic Signature(s) Signed: 05/12/2017 1:25:22 AM By: Lenda Kelp PA-C Entered By: Lenda Kelp on 05/11/2017 15:00:21 Baker, Kimberlie ([id number]) -------------------------------------------------------------------------------- Debridement Details Patient Name: [name] Date of Service: 05/11/2017 2:00 PM Medical Record Number: [id number] Patient Account Number: [id number] Date of Birth/Sex: 1929/07/01 (83 y.o. Female) Treating RN: Huel Coventry Primary Care Provider: SYSTEM, PCP Other Clinician: Referring Provider: Altamese Cabal Treating Provider/Extender: Linwood Dibbles, Anselmo Reihl Weeks in Treatment: 11 Debridement Performed for Wound #1 Right,Anterior Lower Leg Assessment: Performed By: Physician STONE III, Ai Sonnenfeld E., PA-C Debridement: Open Wound/Selective Debridement Selective Description: Pre-procedure Verification/Time Out Yes - 14:40 Taken: Start Time: 14:41 Level: Non-Viable Tissue Total Area Debrided (L x 0.1 (cm) x 0.7 (cm) = 0.07 (cm) W): Tissue and other Non-Viable, Eschar material debrided: Instrument: Other Bleeding: None End Time: 14:43 Procedural Pain: 0 Post Procedural Pain: 0 Response to Treatment: Procedure was tolerated well Post Debridement Measurements of Total Wound Length: (cm) 0.1 Width: (cm) 0.3 Depth: (cm) 0.1 Volume: (cm) 0.002 Character of Wound/Ulcer  Post Improved Debridement: Post Procedure Diagnosis Same as Pre-procedure Electronic Signature(s) Signed: 05/11/2017 4:17:52 PM By: Elliot Gurney, BSN, RN, CWS, Kim RN, BSN Signed: 05/12/2017 1:25:22 AM By: Lenda Kelp PA-C Entered By: Elliot Gurney, BSN, RN, CWS, Kim on 05/11/2017 14:55:05 [place], [name] ([id number]) -------------------------------------------------------------------------------- HPI Details Patient Name: [name] Date of Service: 05/11/2017 2:00 PM Medical Record Number: [id number] Patient Account Number: [id number] Date of Birth/Sex: 1929/03/02 (83 y.o. Female) Treating RN: Huel Coventry Primary Care Provider: SYSTEM, PCP Other Clinician: Referring Provider: Altamese Cabal Treating Provider/Extender: Linwood Dibbles, Seyed Heffley Weeks in Treatment: 11 History of Present Illness HPI Description: 02/23/17 this is an 83 year old woman who is at a fitness club about to sit down on a rowing machine. She fell and traumatized the anterior part of her right leg on a sharp metal surface. This caused immediate pain and bleeding. She stopped off on her way home at an urgent care ultimately was referred to the ER and by that time she had a significant hematoma of her right leg. She was followed in the urgent orthopedic clinic. She had a duplex ultrasound of the right leg on 5/10 that was negative for DVT x-ray of the right leg on 01/28/17 was negative including a CT scan of the tib-fib. The CT scan did show the subcutaneous hematoma which on 01/28/17 measured 7.7 cm craniocaudal by 2.5 cm x 5.6 cm AP subcutaneous air was also identified felt likely secondary to small lacerations. There was no acute fracture. The patient has been continuing mostly with topical antibiotics on this wound. She did not have any compression. She is referred here from the urgent orthopedic clinic for review of this. She did not have a wound history she is not a diabetic. ABI in this clinic on the right was 1.1 on the left 1.0. She is on  aspirin as her only anti-coagulant 03/02/17; the patient arrives for review of a traumatic wound on her right anterior leg with subsequent hematoma formation and considerable tissue loss on the right anterior lateral  leg. We'll use silver alginate last week. We kept her under compression and the patient states that her pain was actually a lot better. Wound measures at 5 x 2.5 x 2. Consider home amount of undermining laterally by roughly 3.5 cm from 7 to 12:00 03/09/17; wound actually looks better however there is considerable undermining from roughly 7 to 12:00. No evidence of surrounding infection. A snap VAC was applied today 03/17/17; the patient continues to do quite well. There have been some logistic problems with the wound VAC however we have managed to keep this in place. She did fill out one of the canisters but didn't come in in follow-up. We've been using silver collagen under a snap VAC 03/23/17; on intake today the patient appeared to have a new tunnel superiorly in the wound by 4 cm. This is separated from the undermining from roughly 7 or 11:00 she has had chronically. This undermines at 1.8 cm. We've been using silver collagen under a snapVAC 03/26/2017 -- patient of Dr. Leanord Hawking who came in to see me today because she has developed swelling and redness over the area where the snap vacuum was applied and has been having significant discomfort. Culture taken last week is not yet in 04/06/17 on evaluation today patient's wound appears to be doing well with an excellent granulation bed. Unfortunately she does have some swelling in the immediate the sanity of the wound which I fear may be causing some delayed healing. Fortunately there does not appear to be any evidence of infection. 04/13/17; the patient's wound is a lot better than when I last saw this 3 weeks ago. Healthy granulation. The tunneling sites of closed over. As noted last week she does have some swelling especially  medially. Therefore I agree with continued compression. We have been using silver collagen 04/20/17; continued improvement in the overall condition of the wound bed. She complains of numbness in the lateral third toes and also some weakness. After further questioning it would appear that this is been present for a while [not wrap injury]. The original trauma was associated with a deep hematoma, certainly nerve injury here isn't impossible [facility], [name] ([id number]) 04/27/17; her wound continues to improve in overall dimensions and condition. She asked today not to have her leg wrap so tightly so she can put on preferred foot wear. 05/04/17 on evaluation today patient's right lower extremity wound appears to be doing very well. She is tolerating the compression and feels like that she would love to have this off but if it has to be on in order to continue to treat her wounds she will continue to work this. Fortunately she has no discomfort at this point. She has no nausea, vomiting, or diarrhea and no fever chills. 05/11/17 on evaluation today patient appears to be doing fairly well in regard to her right lower so many wound. She has been tolerating the dressing changes without complication. Overall I'm pleased with the progress that has been made in her wound appears to be almost completely healed initially there some eschar covering the wound. No fevers, chills, nausea, or vomiting noted at this time. Electronic Signature(s) Signed: 05/12/2017 1:25:22 AM By: Lenda Kelp PA-C Entered By: Lenda Kelp on 05/11/2017 15:05:26 [name], [name] ([id number]) -------------------------------------------------------------------------------- Physical Exam Details Patient Name: [name] Date of Service: 05/11/2017 2:00 PM Medical Record Number: [id number] Patient Account Number: [id number] Date of Birth/Sex: 29-Aug-1929 (83 y.o. Female) Treating RN: Huel Coventry Primary Care Provider: SYSTEM, PCP Other  Clinician: Referring  Provider: Altamese Cabal Treating Provider/Extender: STONE III, Jerrie Schussler Weeks in Treatment: 11 Constitutional Well-nourished and well-hydrated in no acute distress. Respiratory normal breathing without difficulty. clear to auscultation bilaterally. Cardiovascular regular rate and rhythm with normal S1, S2. Psychiatric this patient is able to make decisions and demonstrates good insight into disease process. Alert and Oriented x 3. pleasant and cooperative. Notes Patient's wound appears to be doing very well with mostly epithelialization noted. She does have a very slight area is still open in the central portion of the wound after the eschar today. Fortunately she is not significantly swollen she again wants to discontinue the wrap as this is something that has really been involved during the time that she has been trying to get this wound healed. Electronic Signature(s) Signed: 05/12/2017 1:25:22 AM By: Lenda Kelp PA-C Entered By: Lenda Kelp on 05/11/2017 15:12:04 [place], [name] ([id number]) -------------------------------------------------------------------------------- Physician Orders Details Patient Name: [name] Date of Service: 05/11/2017 2:00 PM Medical Record Number: [id number] Patient Account Number: [id number] Date of Birth/Sex: 26-Dec-1928 (83 y.o. Female) Treating RN: Huel Coventry Primary Care Provider: SYSTEM, PCP Other Clinician: Referring Provider: Altamese Cabal Treating Provider/Extender: Linwood Dibbles, Bharat Antillon Weeks in Treatment: 70 Verbal / Phone Orders: No Diagnosis Coding ICD-10 Coding Code Description S81.811D Laceration without foreign body, right lower leg, subsequent encounter I87.321 Chronic venous hypertension (idiopathic) with inflammation of right lower extremity Wound Cleansing Wound #1 Right,Anterior Lower Leg o Cleanse wound with mild soap and water o No tub bath. Primary Wound Dressing Wound #1 Right,Anterior Lower  Leg o Prisma Ag Dressing Change Frequency Wound #1 Right,Anterior Lower Leg o Change dressing every week Follow-up Appointments Wound #1 Right,Anterior Lower Leg o Return Appointment in 1 week. o Nurse Visit as needed Edema Control Wound #1 Right,Anterior Lower Leg o Elevate legs to the level of the heart and pump ankles as often as possible Additional Orders / Instructions Wound #1 Right,Anterior Lower Leg o Increase protein intake. o Activity as tolerated Electronic Signature(s) Signed: 05/11/2017 4:17:52 PM By: Elliot Gurney, BSN, RN, CWS, Kim RN, BSN [place], [name] ([id number]) Signed: 05/12/2017 1:25:22 AM By: Lenda Kelp PA-C Entered By: Elliot Gurney BSN, RN, CWS, Kim on 05/11/2017 14:55:55 [place], [name] ([id number]) -------------------------------------------------------------------------------- Problem List Details Patient Name: [name] Date of Service: 05/11/2017 2:00 PM Medical Record Number: [id number] Patient Account Number: [id number] Date of Birth/Sex: 1929/06/06 (83 y.o. Female) Treating RN: Huel Coventry Primary Care Provider: SYSTEM, PCP Other Clinician: Referring Provider: Altamese Cabal Treating Provider/Extender: Linwood Dibbles, Gaberiel Youngblood Weeks in Treatment: 11 Active Problems ICD-10 Encounter Code Description Active Date Diagnosis S81.811D Laceration without foreign body, right lower leg, 02/23/2017 Yes subsequent encounter I87.321 Chronic venous hypertension (idiopathic) with 02/23/2017 Yes inflammation of right lower extremity Inactive Problems Resolved Problems Electronic Signature(s) Signed: 05/11/2017 6:32:12 PM By: Elliot Gurney, BSN, RN, CWS, Kim RN, BSN Signed: 05/12/2017 1:25:22 AM By: Lenda Kelp PA-C Entered By: Elliot Gurney, BSN, RN, CWS, Kim on 05/11/2017 18:31:36 [name], [name] ([id number]) -------------------------------------------------------------------------------- Progress Note Details Patient Name: [name] Date of Service: 05/11/2017 2:00 PM Medical  Record Number: [id number] Patient Account Number: [id number] Date of Birth/Sex: 02-15-1929 (83 y.o. Female) Treating RN: Huel Coventry Primary Care Provider: SYSTEM, PCP Other Clinician: Referring Provider: Altamese Cabal Treating Provider/Extender: Linwood Dibbles, Chaunda Vandergriff Weeks in Treatment: 11 Subjective Chief Complaint Information obtained from Patient 02/23/17; patient is here for review of wound on the right anterior lower leg History of Present Illness (HPI) 02/23/17 this is an 83 year old woman who is at a fitness club about to sit  down on a rowing machine. She fell and traumatized the anterior part of her right leg on a sharp metal surface. This caused immediate pain and bleeding. She stopped off on her way home at an urgent care ultimately was referred to the ER and by that time she had a significant hematoma of her right leg. She was followed in the urgent orthopedic clinic. She had a duplex ultrasound of the right leg on 5/10 that was negative for DVT x-ray of the right leg on 01/28/17 was negative including a CT scan of the tib-fib. The CT scan did show the subcutaneous hematoma which on 01/28/17 measured 7.7 cm craniocaudal by 2.5 cm x 5.6 cm AP subcutaneous air was also identified felt likely secondary to small lacerations. There was no acute fracture. The patient has been continuing mostly with topical antibiotics on this wound. She did not have any compression. She is referred here from the urgent orthopedic clinic for review of this. She did not have a wound history she is not a diabetic. ABI in this clinic on the right was 1.1 on the left 1.0. She is on aspirin as her only anti-coagulant 03/02/17; the patient arrives for review of a traumatic wound on her right anterior leg with subsequent hematoma formation and considerable tissue loss on the right anterior lateral leg. We'll use silver alginate last week. We kept her under compression and the patient states that her pain was actually a lot  better. Wound measures at 5 x 2.5 x 2. Consider home amount of undermining laterally by roughly 3.5 cm from 7 to 12:00 03/09/17; wound actually looks better however there is considerable undermining from roughly 7 to 12:00. No evidence of surrounding infection. A snap VAC was applied today 03/17/17; the patient continues to do quite well. There have been some logistic problems with the wound VAC however we have managed to keep this in place. She did fill out one of the canisters but didn't come in in follow-up. We've been using silver collagen under a snap VAC 03/23/17; on intake today the patient appeared to have a new tunnel superiorly in the wound by 4 cm. This is separated from the undermining from roughly 7 or 11:00 she has had chronically. This undermines at 1.8 cm. We've been using silver collagen under a snapVAC 03/26/2017 -- patient of Dr. Leanord Hawkingobson who came in to see me today because she has developed swelling and redness over the area where the snap vacuum was applied and has been having significant discomfort. Culture taken last week is not yet in 04/06/17 on evaluation today patient's wound appears to be doing well with an excellent granulation bed. Unfortunately she does have some swelling in the immediate the sanity of the wound which I fear may be causing some delayed healing. Fortunately there does not appear to be any evidence of infection. 04/13/17; the patient's wound is a lot better than when I last saw this 3 weeks ago. Healthy granulation. The tunneling sites of closed over. As noted last week she does have some swelling especially medially. [facility], [name] ([id number]) Therefore I agree with continued compression. We have been using silver collagen 04/20/17; continued improvement in the overall condition of the wound bed. She complains of numbness in the lateral third toes and also some weakness. After further questioning it would appear that this is been present for a while [not  wrap injury]. The original trauma was associated with a deep hematoma, certainly nerve injury here isn't impossible 04/27/17; her  wound continues to improve in overall dimensions and condition. She asked today not to have her leg wrap so tightly so she can put on preferred foot wear. 05/04/17 on evaluation today patient's right lower extremity wound appears to be doing very well. She is tolerating the compression and feels like that she would love to have this off but if it has to be on in order to continue to treat her wounds she will continue to work this. Fortunately she has no discomfort at this point. She has no nausea, vomiting, or diarrhea and no fever chills. 05/11/17 on evaluation today patient appears to be doing fairly well in regard to her right lower so many wound. She has been tolerating the dressing changes without complication. Overall I'm pleased with the progress that has been made in her wound appears to be almost completely healed initially there some eschar covering the wound. No fevers, chills, nausea, or vomiting noted at this time. Objective Constitutional Well-nourished and well-hydrated in no acute distress. Vitals Time Taken: 2:05 PM, Height: 64 in, Weight: 138 lbs, BMI: 23.7, Pulse: 79 bpm, Respiratory Rate: 16 breaths/min, Blood Pressure: 157/67 mmHg. Respiratory normal breathing without difficulty. clear to auscultation bilaterally. Cardiovascular regular rate and rhythm with normal S1, S2. Psychiatric this patient is able to make decisions and demonstrates good insight into disease process. Alert and Oriented x 3. pleasant and cooperative. General Notes: Patient's wound appears to be doing very well with mostly epithelialization noted. She does have a very slight area is still open in the central portion of the wound after the eschar today. Fortunately she is not significantly swollen she again wants to discontinue the wrap as this is something that has  really been involved during the time that she has been trying to get this wound healed. Integumentary (Hair, Skin) Wound #1 status is Open. Original cause of wound was Trauma. The wound is located on the [facility], [name] ([id number]) Right,Anterior Lower Leg. The wound measures 0.1cm length x 0.3cm width x 0.1cm depth; 0.024cm^2 area and 0.002cm^3 volume. There is Fat Layer (Subcutaneous Tissue) Exposed exposed. There is no tunneling or undermining noted. There is a medium amount of serous drainage noted. The wound margin is flat and intact. There is no granulation within the wound bed. There is a large (67-100%) amount of necrotic tissue within the wound bed including Eschar. The periwound skin appearance exhibited: Hemosiderin Staining, Mottled. The periwound skin appearance did not exhibit: Callus, Crepitus, Excoriation, Induration, Rash, Scarring, Dry/Scaly, Maceration, Atrophie Blanche, Cyanosis, Ecchymosis, Pallor, Rubor, Erythema. Periwound temperature was noted as No Abnormality. The periwound has tenderness on palpation. Assessment Active Problems ICD-10 S81.811D - Laceration without foreign body, right lower leg, subsequent encounter I87.321 - Chronic venous hypertension (idiopathic) with inflammation of right lower extremity Procedures Wound #1 Pre-procedure diagnosis of Wound #1 is a Trauma, Other located on the Right,Anterior Lower Leg . There was a Non-Viable Tissue Open Wound/Selective [id number]) debridement with total area of 0.07 sq cm performed by STONE III, Keelyn Fjelstad E., PA-C. with the following instrument(s): to remove Non-Viable tissue/material including Eschar. A time out was conducted at 14:40, prior to the start of the procedure. There was no bleeding. The procedure was tolerated well with a pain level of 0 throughout and a pain level of 0 following the procedure. Post Debridement Measurements: 0.1cm length x 0.3cm width x 0.1cm depth; 0.002cm^3 volume. Character of  Wound/Ulcer Post Debridement is improved. Post procedure Diagnosis Wound #1: Same as Pre-Procedure Plan Wound Cleansing: Wound #1  Right,Anterior Lower Leg: Cleanse wound with mild soap and water No tub bath. [facility], [name] ([id number]) Primary Wound Dressing: Wound #1 Right,Anterior Lower Leg: Prisma Ag Dressing Change Frequency: Wound #1 Right,Anterior Lower Leg: Change dressing every week Follow-up Appointments: Wound #1 Right,Anterior Lower Leg: Return Appointment in 1 week. Nurse Visit as needed Edema Control: Wound #1 Right,Anterior Lower Leg: Elevate legs to the level of the heart and pump ankles as often as possible Additional Orders / Instructions: Wound #1 Right,Anterior Lower Leg: Increase protein intake. Activity as tolerated Patient is doing very well at this point with the Current wound care measures. I'm gonna recommend that we continue with her dressing other than the fact that I am going to discontinue the compression wrap at this time just to see how a week. I think it is very likely this wound will heal in the interim. If anything worsens in the meantime patient will contact our office for additional recommendations. Otherwise we will see were things stand in one week. Electronic Signature(s) Signed: 05/12/2017 1:25:22 AM By: Lenda Kelp PA-C Entered By: Lenda Kelp on 05/11/2017 15:13:40 [place], [name] ([id number]) -------------------------------------------------------------------------------- SuperBill Details Patient Name: [name] Date of Service: 05/11/2017 Medical Record Number: [id number] Patient Account Number: [id number] Date of Birth/Sex: 1929/02/08 (83 y.o. Female) Treating RN: Huel Coventry Primary Care Provider: SYSTEM, PCP Other Clinician: Referring Provider: Altamese Cabal Treating Provider/Extender: Linwood Dibbles, Drina Jobst Weeks in Treatment: 11 Diagnosis Coding ICD-10 Codes Code Description S81.811D Laceration without foreign body, right  lower leg, subsequent encounter I87.321 Chronic venous hypertension (idiopathic) with inflammation of right lower extremity Facility Procedures CPT4 Code Description: [id number] - DEBRIDE WOUND 1ST 20 SQ CM OR < ICD-10 Description Diagnosis S81.811D Laceration without foreign body, right lower leg, s Modifier: ubsequent enc Quantity: 1 ounter Physician Procedures CPT4 Code Description: [id number] - WC PHYS DEBR WO ANESTH 20 SQ CM ICD-10 Description Diagnosis S81.811D Laceration without foreign body, right lower leg, s Modifier: ubsequent enc Quantity: 1 ounter Electronic Signature(s) Signed: 05/12/2017 1:25:22 AM By: Lenda Kelp PA-C Entered By: Lenda Kelp on 05/11/2017 15:12:49

## 2017-05-14 NOTE — Progress Notes (Signed)
[name], [name] ([id number]) Visit Report for 05/11/2017 Arrival Information Details Patient Name: [name], [name] Date of Service: 05/11/2017 2:00 PM Medical Record Number: [id number] Patient Account Number: [id number] Date of Birth/Sex: 1929/07/12 (83 y.o. Female) Treating RN: Huel Coventry Primary Care Brayn Eckstein: SYSTEM, PCP Other Clinician: Referring Georgio Hattabaugh: Altamese Cabal Treating Marcellius Montagna/Extender: Linwood Dibbles, HOYT Weeks in Treatment: 11 Visit Information History Since Last Visit Added or deleted any medications: No Patient Arrived: Ambulatory Any new allergies or adverse reactions: No Arrival Time: 14:14 Had a fall or experienced change in No Accompanied By: [name] activities of daily living that may affect Transfer Assistance: None risk of falls: Patient Identification Verified: Yes Signs or symptoms of abuse/neglect since last No Secondary Verification Process Yes visito Completed: Hospitalized since last visit: No Patient Requires Transmission-Based No Has Dressing in Place as Prescribed: Yes Precautions: Has Compression in Place as Prescribed: Yes Patient Has Alerts: No Pain Present Now: No Electronic Signature(s) Signed: 05/11/2017 6:32:12 PM By: Elliot Gurney, BSN, RN, CWS, Kim RN, BSN Previous Signature: 05/11/2017 4:17:52 PM Version By: Elliot Gurney, BSN, RN, CWS, Kim RN, BSN Entered By: Elliot Gurney, BSN, RN, CWS, Kim on 05/11/2017 18:30:31 [place], [name] ([id number]) -------------------------------------------------------------------------------- Encounter Discharge Information Details Patient Name: [name] Date of Service: 05/11/2017 2:00 PM Medical Record Number: [id number] Patient Account Number: [id number] Date of Birth/Sex: Apr 23, 1929 (83 y.o. Female) Treating RN: Huel Coventry Primary Care Abdur Hoglund: SYSTEM, PCP Other Clinician: Referring Correen Bubolz: Altamese Cabal Treating Laruth Hanger/Extender: Linwood Dibbles, HOYT Weeks in Treatment: 11 Encounter Discharge Information Items Discharge Pain  Level: 0 Discharge Condition: Stable Ambulatory Status: Ambulatory Discharge Destination: Home Transportation: Private Auto Accompanied By: daughter Schedule Follow-up Appointment: Yes Medication Reconciliation completed and provided to Patient/Care Yes Jaxzen Vanhorn: Provided on Clinical Summary of Care: 05/11/2017 Form Type Recipient Paper Patient RB Electronic Signature(s) Signed: 05/13/2017 10:14:57 AM By: Gwenlyn Perking Entered By: Gwenlyn Perking on 05/11/2017 15:04:56 [place], [name] ([id number]) -------------------------------------------------------------------------------- Lower Extremity Assessment Details Patient Name: [name] Date of Service: 05/11/2017 2:00 PM Medical Record Number: [id number] Patient Account Number: [id number] Date of Birth/Sex: December 19, 1928 (83 y.o. Female) Treating RN: Huel Coventry Primary Care Clarine Elrod: SYSTEM, PCP Other Clinician: Referring Daris Harkins: Altamese Cabal Treating Keishawna Carranza/Extender: Linwood Dibbles, HOYT Weeks in Treatment: 11 Edema Assessment Assessed: [Left: No] [Right: No] E[Left: dema] [Right: :] Calf Left: Right: Point of Measurement: 28 cm From Medial Instep cm 32.5 cm Ankle Left: Right: Point of Measurement: 8 cm From Medial Instep cm 20 cm Vascular Assessment Pulses: Dorsalis Pedis Palpable: [Right:Yes] Posterior Tibial Palpable: [Right:Yes] Extremity colors, hair growth, and conditions: Extremity Color: [Right:Normal] Hair Growth on Extremity: [Right:Yes] Temperature of Extremity: [Right:Warm] Dependent Rubor: [Right:No] Lipodermatosclerosis: [Right:No] Toe Nail Assessment Left: Right: Thick: No Discolored: No Deformed: No Improper Length and Hygiene: No Electronic Signature(s) Signed: 05/11/2017 6:32:12 PM By: Elliot Gurney, BSN, RN, CWS, Kim RN, BSN Previous Signature: 05/11/2017 4:17:52 PM Version By: Elliot Gurney, BSN, RN, CWS, Kim RN, BSN [place], [name] ([id number]) Entered By: Elliot Gurney, BSN, RN, CWS, Kim on 05/11/2017 18:30:59 [place], [name]  ([id number]) -------------------------------------------------------------------------------- Multi Wound Chart Details Patient Name: [name] Date of Service: 05/11/2017 2:00 PM Medical Record Number: [id number] Patient Account Number: [id number] Date of Birth/Sex: 03-15-1929 (83 y.o. Female) Treating RN: Huel Coventry Primary Care Agapito Hanway: SYSTEM, PCP Other Clinician: Referring Eldridge Marcott: Altamese Cabal Treating Iyonnah Ferrante/Extender: STONE III, HOYT Weeks in Treatment: 11 Vital Signs Height(in): 64 Pulse(bpm): 79 Weight(lbs): 138 Blood Pressure 157/67 (mmHg): Body Mass Index(BMI): 24 Temperature(F): Respiratory Rate 16 (breaths/min): Photos: [N/A:N/A] Wound Location: Right Lower Leg - Anterior N/A N/A Wounding Event: Trauma N/A  N/A Primary Etiology: Trauma, Other N/A N/A Comorbid History: Cataracts, Arrhythmia, N/A N/A Congestive Heart Failure, Hypertension, Osteoarthritis Date Acquired: 01/28/2017 N/A N/A Weeks of Treatment: 11 N/A N/A Wound Status: Open N/A N/A Measurements L x W x D 0.1x0.3x0.1 N/A N/A (cm) Area (cm) : 0.024 N/A N/A Volume (cm) : 0.002 N/A N/A % Reduction in Area: 99.80% N/A N/A % Reduction in Volume: 100.00% N/A N/A Classification: Full Thickness With N/A N/A Exposed Support Structures Exudate Amount: Medium N/A N/A Exudate Type: Serous N/A N/A Exudate Color: amber N/A N/A Wound Margin: Flat and Intact N/A N/A Granulation Amount: None Present (0%) N/A N/A [place], [name] ([id number]) Necrotic Amount: Large (67-100%) N/A N/A Necrotic Tissue: Eschar N/A N/A Exposed Structures: Fat Layer (Subcutaneous N/A N/A Tissue) Exposed: Yes Fascia: No Tendon: No Muscle: No Joint: No Bone: No Epithelialization: Large (67-100%) N/A N/A Periwound Skin Texture: Excoriation: No N/A N/A Induration: No Callus: No Crepitus: No Rash: No Scarring: No Periwound Skin Maceration: No N/A N/A Moisture: Dry/Scaly: No Periwound Skin Color: Hemosiderin Staining:  Yes N/A N/A Mottled: Yes Atrophie Blanche: No Cyanosis: No Ecchymosis: No Erythema: No Pallor: No Rubor: No Temperature: No Abnormality N/A N/A Tenderness on Yes N/A N/A Palpation: Wound Preparation: Ulcer Cleansing: Other: N/A N/A soap and water Topical Anesthetic Applied: None, Other: lidocaine 4% Treatment Notes Electronic Signature(s) Signed: 05/11/2017 4:17:52 PM By: Elliot Gurney, BSN, RN, CWS, Kim RN, BSN Entered By: Elliot Gurney, BSN, RN, CWS, Kim on 05/11/2017 14:52:26 [place], [name] ([id number]) -------------------------------------------------------------------------------- Multi-Disciplinary Care Plan Details Patient Name: [name] Date of Service: 05/11/2017 2:00 PM Medical Record Number: [id number] Patient Account Number: [id number] Date of Birth/Sex: May 05, 1929 (83 y.o. Female) Treating RN: Huel Coventry Primary Care Inell Mimbs: SYSTEM, PCP Other Clinician: Referring Yonathan Perrow: Altamese Cabal Treating Taevin Mcferran/Extender: Linwood Dibbles, HOYT Weeks in Treatment: 11 Active Inactive ` Abuse / Safety / Falls / Self Care Management Nursing Diagnoses: History of Falls Impaired home maintenance Impaired physical mobility Potential for falls Potential for injury related to transfers Goals: Patient will not develop complications from immobility Date Initiated: 02/23/2017 Target Resolution Date: 07/26/2017 Goal Status: Active Patient will not experience any injury related to falls Date Initiated: 02/23/2017 Target Resolution Date: 07/26/2017 Goal Status: Active Patient will remain injury free related to falls Date Initiated: 02/23/2017 Target Resolution Date: 07/26/2017 Goal Status: Active Patient/caregiver will demonstrate safe use of adaptive devices to increase mobility Date Initiated: 02/23/2017 Target Resolution Date: 07/26/2017 Goal Status: Active Patient/caregiver will identify factors that restrict self-care and home management Date Initiated: 02/23/2017 Target Resolution Date:  07/26/2017 Goal Status: Active Patient/caregiver will verbalize understanding of skin care regimen Date Initiated: 02/23/2017 Target Resolution Date: 07/26/2017 Goal Status: Active Patient/caregiver will verbalize understanding of the importance to maintain current immunizations/vaccinations Date Initiated: 02/23/2017 Target Resolution Date: 07/26/2017 Goal Status: Active Patient/caregiver will verbalize/demonstrate measure taken to improve self care Date Initiated: 02/23/2017 Target Resolution Date: 07/26/2017 SHARIAN, TROTTER ([id number]) Goal Status: Active Patient/caregiver will verbalize/demonstrate measures taken to improve the patient's personal safety Date Initiated: 02/23/2017 Target Resolution Date: 07/26/2017 Goal Status: Active Patient/caregiver will verbalize/demonstrate measures taken to prevent injury and/or falls Date Initiated: 02/23/2017 Target Resolution Date: 07/26/2017 Goal Status: Active Patient/caregiver will verbalize/demonstrate understanding of what to do in case of emergency Date Initiated: 02/23/2017 Target Resolution Date: 07/26/2017 Goal Status: Active Interventions: Call light and/or bell within patient's reach Assess Activities of Daily Living upon admission and as needed Assess fall risk on admission and as needed Assess: immobility, friction, shearing, incontinence upon admission and as needed Assess impairment of  mobility on admission and as needed per policy Assess personal safety and home safety (as indicated) on admission and as needed Assess self care needs on admission and as needed Provide education on basic hygiene Provide education on fall prevention Provide education on personal and home safety Provide education on safe transfers Treatment Activities: Education provided on Basic Hygiene : 04/27/2017 Notes: ` Orientation to the Wound Care Program Nursing Diagnoses: Knowledge deficit related to the wound healing center  program Goals: Patient/caregiver will verbalize understanding of the Wound Healing Center Program Date Initiated: 02/23/2017 Target Resolution Date: 07/26/2017 Goal Status: Active Interventions: Provide education on orientation to the wound center Notes: Aliyyah Riese, [name] ([id number]) Wound/Skin Impairment Nursing Diagnoses: Impaired tissue integrity Knowledge deficit related to ulceration/compromised skin integrity Goals: Patient/caregiver will verbalize understanding of skin care regimen Date Initiated: 02/23/2017 Target Resolution Date: 07/26/2017 Goal Status: Active Ulcer/skin breakdown will have a volume reduction of 30% by week 4 Date Initiated: 02/23/2017 Target Resolution Date: 07/26/2017 Goal Status: Active Ulcer/skin breakdown will have a volume reduction of 50% by week 8 Date Initiated: 02/23/2017 Target Resolution Date: 07/26/2017 Goal Status: Active Ulcer/skin breakdown will have a volume reduction of 80% by week 12 Date Initiated: 02/23/2017 Target Resolution Date: 07/26/2017 Goal Status: Active Ulcer/skin breakdown will heal within 14 weeks Date Initiated: 02/23/2017 Target Resolution Date: 07/26/2017 Goal Status: Active Interventions: Assess patient/caregiver ability to obtain necessary supplies Assess patient/caregiver ability to perform ulcer/skin care regimen upon admission and as needed Assess ulceration(s) every visit Provide education on ulcer and skin care Treatment Activities: Referred to DME Meiko Ives for dressing supplies : 02/23/2017 Skin care regimen initiated : 02/23/2017 Topical wound management initiated : 02/23/2017 Notes: Electronic Signature(s) Signed: 05/11/2017 6:32:12 PM By: Elliot Gurney, BSN, RN, CWS, Kim RN, BSN Previous Signature: 05/11/2017 4:17:52 PM Version By: Elliot Gurney, BSN, RN, CWS, Kim RN, BSN Entered By: Elliot Gurney, BSN, RN, CWS, Kim on 05/11/2017 18:31:07 [place], [name]  ([id number]) -------------------------------------------------------------------------------- Pain Assessment Details Patient Name: [name] Date of Service: 05/11/2017 2:00 PM Medical Record Number: [id number] Patient Account Number: [id number] Date of Birth/Sex: 04/16/1929 (83 y.o. Female) Treating RN: Huel Coventry Primary Care Lexus Shampine: SYSTEM, PCP Other Clinician: Referring Brittany Amirault: Altamese Cabal Treating Galen Malkowski/Extender: Linwood Dibbles, HOYT Weeks in Treatment: 11 Active Problems Location of Pain Severity and Description of Pain Patient Has Paino No Site Locations With Dressing Change: No Pain Management and Medication Current Pain Management: Goals for Pain Management Topical or injectable lidocaine is offered to patient for acute pain when surgical debridement is performed. If needed, Patient is instructed to use over the counter pain medication for the following 24-48 hours after debridement. Wound care MDs do not prescribed pain medications. Patient has chronic pain or uncontrolled pain. Patient has been instructed to make an appointment with their Primary Care Physician for pain management. Electronic Signature(s) Signed: 05/11/2017 6:32:12 PM By: Elliot Gurney, BSN, RN, CWS, Kim RN, BSN Previous Signature: 05/11/2017 4:17:52 PM Version By: Elliot Gurney, BSN, RN, CWS, Kim RN, BSN Entered By: Elliot Gurney, BSN, RN, CWS, Kim on 05/11/2017 18:30:38 [place], [name] ([id number]) -------------------------------------------------------------------------------- Patient/Caregiver Education Details Patient Name: [name] Date of Service: 05/11/2017 2:00 PM Medical Record Number: [id number] Patient Account Number: [id number] Date of Birth/Gender: 07-27-1929 (83 y.o. Female) Treating RN: Huel Coventry Primary Care Physician: SYSTEM, PCP Other Clinician: Referring Physician: Altamese Cabal Treating Physician/Extender: Skeet Simmer in Treatment: 11 Education Assessment Education Provided  To: Patient Education Topics Provided Wound/Skin Impairment: Handouts: Caring for Your Ulcer Methods: Demonstration Responses: State content correctly Electronic Signature(s) Signed:  05/11/2017 4:17:52 PM By: Elliot Gurney, BSN, RN, CWS, Kim RN, BSN Entered By: Elliot Gurney, BSN, RN, CWS, Kim on 05/11/2017 15:01:46 [place], [name] ([id number]) -------------------------------------------------------------------------------- Wound Assessment Details Patient Name: [name] Date of Service: 05/11/2017 2:00 PM Medical Record Number: [id number] Patient Account Number: [id number] Date of Birth/Sex: [birth year] (83 y.o. Female) Treating RN: Huel Coventry Primary Care Jenea Dake: SYSTEM, PCP Other Clinician: Referring Alayzia Pavlock: Altamese Cabal Treating Ladona Rosten/Extender: Altamese [place] in Treatment: 11 Wound Status Wound Number: 1 Primary Trauma, Other Etiology: Wound Location: Right Lower Leg - Anterior Wound Open Wounding Event: Trauma Status: Date Acquired: 01/28/2017 Comorbid Cataracts, Arrhythmia, Congestive Weeks Of Treatment: 11 History: Heart Failure, Hypertension, Clustered Wound: No Osteoarthritis Photos Wound Measurements Length: (cm) 0.1 Width: (cm) 0.3 Depth: (cm) 0.1 Area: (cm) 0.024 Volume: (cm) 0.002 % Reduction in Area: 99.8% % Reduction in Volume: 100% Epithelialization: Large (67-100%) Tunneling: No Undermining: No Wound Description Full Thickness With Exposed Classification: Support Structures Wound Margin: Flat and Intact Exudate Medium Amount: Exudate Type: Serous Exudate Color: amber Foul Odor After Cleansing: No Slough/Fibrino Yes Wound Bed Granulation Amount: None Present (0%) Exposed Structure Necrotic Amount: Large (67-100%) Fascia Exposed: No Necrotic Quality: Eschar Fat Layer (Subcutaneous Tissue) Exposed: Yes Tendon Exposed: No Muscle Exposed: No [place], [name] ([id number]) Joint Exposed: No Bone Exposed: No Periwound Skin Texture Texture  Color No Abnormalities Noted: No No Abnormalities Noted: No Callus: No Atrophie Blanche: No Crepitus: No Cyanosis: No Excoriation: No Ecchymosis: No Induration: No Erythema: No Rash: No Hemosiderin Staining: Yes Scarring: No Mottled: Yes Pallor: No Moisture Rubor: No No Abnormalities Noted: No Dry / Scaly: No Temperature / Pain Maceration: No Temperature: No Abnormality Tenderness on Palpation: Yes Wound Preparation Ulcer Cleansing: Other: soap and water, Topical Anesthetic Applied: None, Other: lidocaine 4%, Treatment Notes Wound #1 (Right, Anterior Lower Leg) 1. Cleansed with: Clean wound with Normal Saline 4. Dressing Applied: Prisma Ag 5. Secondary Dressing Applied Bordered Foam Dressing Electronic Signature(s) Signed: 05/11/2017 4:17:52 PM By: Elliot Gurney, BSN, RN, CWS, Kim RN, BSN Entered By: Elliot Gurney, BSN, RN, CWS, Kim on 05/11/2017 14:24:15 [place], [name] ([id number]) -------------------------------------------------------------------------------- Vitals Details Patient Name: [name] Date of Service: 05/11/2017 2:00 PM Medical Record Number: [id number] Patient Account Number: [id number] Date of Birth/Sex: 09/15/1929 (83 y.o. Female) Treating RN: Huel Coventry Primary Care Lateia Fraser: SYSTEM, PCP Other Clinician: Referring Manoj Enriquez: Altamese Cabal Treating Johnica Armwood/Extender: Linwood Dibbles, HOYT Weeks in Treatment: 11 Vital Signs Time Taken: 14:05 Pulse (bpm): 79 Height (in): 64 Respiratory Rate (breaths/min): 16 Weight (lbs): 138 Blood Pressure (mmHg): 157/67 Body Mass Index (BMI): 23.7 Reference Range: 80 - 120 mg / dl Electronic Signature(s) Signed: 05/11/2017 6:32:12 PM By: Elliot Gurney, BSN, RN, CWS, Kim RN, BSN Previous Signature: 05/11/2017 4:17:52 PM Version By: Elliot Gurney, BSN, RN, CWS, Kim RN, BSN Entered By: Elliot Gurney, BSN, RN, CWS, Kim on 05/11/2017 18:30:49

## 2017-05-18 ENCOUNTER — Encounter: Payer: Medicare Other | Admitting: Internal Medicine

## 2017-05-18 DIAGNOSIS — I87321 Chronic venous hypertension (idiopathic) with inflammation of right lower extremity: Secondary | ICD-10-CM | POA: Diagnosis not present

## 2017-05-19 NOTE — Progress Notes (Signed)
[name], Mccullough ([id number]) Visit Report for 05/18/2017 Debridement Details Patient Name: [name], [name] Date of Service: 05/18/2017 3:00 PM Medical Record Patient Account Number: [id number] Number: Treating Mccullough: [name] Jan 16, 1929 (83 y.o. Other Clinician: Date of Birth/Sex: Female) Treating [name] Primary Care Mccullough: SYSTEM, Mccullough Mccullough/Extender: [name]: [name] in Treatment: 12 Debridement Performed for Wound #1 Right,Anterior Lower Leg Assessment: Performed By: Physician [name] Caul, MD Debridement: Open Wound/Selective Debridement Selective Description: Pre-procedure Verification/Time Out Yes - 15:15 Taken: Start Time: 15:16 Level: Non-Viable Tissue Total Area Debrided (L x 0.1 (cm) x 1 (cm) = 0.1 (cm) W): Tissue and other Non-Viable, Eschar material debrided: Instrument: Curette Bleeding: None End Time: 15:17 Procedural Pain: 0 Post Procedural Pain: 0 Response to Treatment: Procedure was tolerated well Post Debridement Measurements of Total Wound Length: (cm) 0.1 Width: (cm) 0.3 Depth: (cm) 0.1 Volume: (cm) 0.002 Character of Wound/Ulcer Post Improved Debridement: Post Procedure Diagnosis Same as Pre-procedure Electronic Signature(s) [name] ([id number]) Signed: 05/18/2017 5:34:31 PM By: [name] Najjar MD Signed: 05/18/2017 6:37:35 PM By: [name], BSN, Mccullough, CWS, [name], BSN Entered By: [name] on 05/18/2017 15:36:15 Mccullough, [name] ([id number]) -------------------------------------------------------------------------------- HPI Details Patient Name: [name] Date of Service: 05/18/2017 3:00 PM Medical Record Patient Account Number: [id number] Number: Treating Mccullough: [name] 11-09-1928 (83 y.o. Other Clinician: Date of Birth/Sex: Female) Treating [name] Primary Care Mccullough: SYSTEM, Mccullough Mccullough/Extender: [name]: [name] in Treatment:  12 History of Present Illness HPI Description: 02/23/17 this is an 83 year old woman who is at a fitness club about to sit down on a rowing machine. She fell and traumatized the anterior part of her right leg on a sharp metal surface. This caused immediate pain and bleeding. She stopped off on her way home at an urgent care ultimately was referred to the ER and by that time she had a significant hematoma of her right leg. She was followed in the urgent orthopedic clinic. She had a duplex ultrasound of the right leg on 5/10 that was negative for DVT x-ray of the right leg on 01/28/17 was negative including a CT scan of the tib-fib. The CT scan did show the subcutaneous hematoma which on 01/28/17 measured 7.7 cm craniocaudal by 2.5 cm x 5.6 cm AP subcutaneous air was also identified felt likely secondary to small lacerations. There was no acute fracture. The patient has been continuing mostly with topical antibiotics on this wound. She did not have any compression. She is referred here from the urgent orthopedic clinic for review of this. She did not have a wound history she is not a diabetic. ABI in this clinic on the right was 1.1 on the left 1.0. She is on aspirin as her only anti-coagulant 03/02/17; the patient arrives for review of a traumatic wound on her right anterior leg with subsequent hematoma formation and considerable tissue loss on the right anterior lateral leg. We'll use silver alginate last week. We kept her under compression and the patient states that her pain was actually a lot better. Wound measures at 5 x 2.5 x 2. Consider home amount of undermining laterally by roughly 3.5 cm from 7 to 12:00 03/09/17; wound actually looks better however there is considerable undermining from roughly 7 to 12:00. No evidence of surrounding infection. A snap VAC was applied today 03/17/17; the patient continues to do quite well. There have been some logistic problems with the wound VAC however we have  managed to keep this in  place. She did fill out one of the canisters but didn't come in in follow-up. We've been using silver collagen under a snap VAC 03/23/17; on intake today the patient appeared to have a new tunnel superiorly in the wound by 4 cm. This is separated from the undermining from roughly 7 or 11:00 she has had chronically. This undermines at 1.8 cm. We've been using silver collagen under a snapVAC 03/26/2017 -- patient of Dr. Leanord Mccullough who came in to see me today because she has developed swelling and redness over the area where the snap vacuum was applied and has been having significant discomfort. Culture taken last week is not yet in 04/06/17 on evaluation today patient's wound appears to be doing well with an excellent granulation bed. Unfortunately she does have some swelling in the immediate the sanity of the wound which I fear may be causing some delayed healing. Fortunately there does not appear to be any evidence of infection. 04/13/17; the patient's wound is a lot better than when I last saw this 3 weeks ago. Healthy granulation. The tunneling sites of closed over. As noted last week she does have some swelling especially medially. Therefore I agree with continued compression. We have been using silver collagen 04/20/17; continued improvement in the overall condition of the wound bed. She complains of numbness in the lateral third toes and also some weakness. After further questioning it would appear that this is been [name] ([id number]) present for a while [not wrap injury]. The original trauma was associated with a deep hematoma, certainly nerve injury here isn't impossible 04/27/17; her wound continues to improve in overall dimensions and condition. She asked today not to have her leg wrap so tightly so she can put on preferred foot wear. 05/04/17 on evaluation today patient's right lower extremity wound appears to be doing very well. She is tolerating the compression  and feels like that she would love to have this off but if it has to be on in order to continue to treat her wounds she will continue to work this. Fortunately she has no discomfort at this point. She has no nausea, vomiting, or diarrhea and no fever chills. 05/11/17 on evaluation today patient appears to be doing fairly well in regard to her right lower so many wound. She has been tolerating the dressing changes without complication. Overall I'm pleased with the progress that has been made in her wound appears to be almost completely healed initially there some eschar covering the wound. No fevers, chills, nausea, or vomiting noted at this time. 05/18/17; patient was hoping to be healed today however she once again had eschar over the surface of the wound. She also had irritation below the wound which I think is some form of contact dermatitis not tinea. She does not complain of pain or pruritus Electronic Signature(s) Signed: 05/18/2017 5:34:31 PM By: [name] Najjar MD Entered By: [name] on 05/18/2017 15:37:25 Moralez, Jalysa ([id number]) -------------------------------------------------------------------------------- Physical Exam Details Patient Name: [name] Date of Service: 05/18/2017 3:00 PM Medical Record Patient Account Number: [id number] Number: Treating Mccullough: [name] 06/03/1929 (83 y.o. Other Clinician: Date of Birth/Sex: Female) Treating Kisha Messman Primary Care Mccullough: SYSTEM, Mccullough Mccullough/Extender: [name]: [name] in Treatment: 12 Constitutional Patient is hypertensive.. Pulse regular and within target range for patient.Marland Kitchen Respirations regular, non-labored and within target range.. Temperature is normal and within the target range for the patient.Marland Kitchen appears in no distress. Eyes Conjunctivae clear. No discharge. Cardiovascular Pedal pulses palpable  and strong bilaterally.. Notes Wound exam; the patient was not put under  compression last week and there is some localized swelling which is probably localized lymphedema given the extent of the damage chair. Below the wound she had what appeared to be some form of irritation/dermatitis. Also what appeared to be small opening here as well. There is no evidence of cellulitis Electronic Signature(s) Signed: 05/18/2017 5:34:31 PM By: [name] Najjar MD Entered By: [name] on 05/18/2017 15:40:29 Witcher, Layza ([id number]) -------------------------------------------------------------------------------- Physician Orders Details Patient Name: [name] Date of Service: 05/18/2017 3:00 PM Medical Record Patient Account Number: [id number] Number: Treating Mccullough: [name] [birth year] (83 y.o. Other Clinician: Date of Birth/Sex: Female) Treating Loraine Freid Primary Care Mccullough: SYSTEM, Mccullough Mccullough/Extender: [name]: [name] in Treatment: 47 Verbal / Phone Orders: No Diagnosis Coding Wound Cleansing Wound #1 Right,Anterior Lower Leg o Cleanse wound with mild soap and water o No tub bath. Skin Barriers/Peri-Wound Care Wound #1 Right,Anterior Lower Leg o Triamcinolone Acetonide Ointment Primary Wound Dressing Wound #1 Right,Anterior Lower Leg o Prisma Ag Secondary Dressing Wound #1 Right,Anterior Lower Leg o ABD pad Dressing Change Frequency Wound #1 Right,Anterior Lower Leg o Change dressing every week Follow-up Appointments Wound #1 Right,Anterior Lower Leg o Return Appointment in 1 week. o Nurse Visit as needed Edema Control o 3 Layer Compression System - Right Lower Extremity o Elevate legs to the level of the heart and pump ankles as often as possible Additional Orders / Instructions Wound #1 Right,Anterior Lower Leg o Increase protein intake. o Activity as tolerated [employer], Brytani ([id number]) Electronic Signature(s) Signed: 05/18/2017 5:34:31 PM By: [name] Najjar MD Signed:  05/18/2017 6:37:35 PM By: [name], BSN, Mccullough, CWS, [name], BSN Entered By: [name], BSN, Mccullough, CWS, [name] on 05/18/2017 15:35:24 [place], Malyah ([id number]) -------------------------------------------------------------------------------- Problem List Details Patient Name: [name] Date of Service: 05/18/2017 3:00 PM Medical Record Patient Account Number: [id number] Number: Treating Mccullough: [name] 1929-04-07 (83 y.o. Other Clinician: Date of Birth/Sex: Female) Treating Nadim Malia Primary Care Mccullough: SYSTEM, Mccullough Mccullough/Extender: [name]: [name] in Treatment: 12 Active Problems ICD-10 Encounter Code Description Active Date Diagnosis S81.811D Laceration without foreign body, right lower leg, 02/23/2017 Yes subsequent encounter I87.321 Chronic venous hypertension (idiopathic) with 02/23/2017 Yes inflammation of right lower extremity Inactive Problems Resolved Problems Electronic Signature(s) Signed: 05/18/2017 5:34:31 PM By: [name] Najjar MD Entered By: [name] on 05/18/2017 15:35:23 Toscano, Ethyl ([id number]) -------------------------------------------------------------------------------- Progress Note Details Patient Name: [name] Date of Service: 05/18/2017 3:00 PM Medical Record Patient Account Number: [id number] Number: Treating Mccullough: [name] May 06, 1929 (83 y.o. Other Clinician: Date of Birth/Sex: Female) Treating Dellie Piasecki Primary Care Mccullough: SYSTEM, Mccullough Mccullough/Extender: [name]: [name] in Treatment: 12 Subjective History of Present Illness (HPI) 02/23/17 this is an 83 year old woman who is at a fitness club about to sit down on a rowing machine. She fell and traumatized the anterior part of her right leg on a sharp metal surface. This caused immediate pain and bleeding. She stopped off on her way home at an urgent care ultimately was referred to the ER and by that time she had  a significant hematoma of her right leg. She was followed in the urgent orthopedic clinic. She had a duplex ultrasound of the right leg on 5/10 that was negative for DVT x-ray of the right leg on 01/28/17 was negative including a CT scan of the tib-fib. The CT scan did show the subcutaneous hematoma  which on 01/28/17 measured 7.7 cm craniocaudal by 2.5 cm x 5.6 cm AP subcutaneous air was also identified felt likely secondary to small lacerations. There was no acute fracture. The patient has been continuing mostly with topical antibiotics on this wound. She did not have any compression. She is referred here from the urgent orthopedic clinic for review of this. She did not have a wound history she is not a diabetic. ABI in this clinic on the right was 1.1 on the left 1.0. She is on aspirin as her only anti-coagulant 03/02/17; the patient arrives for review of a traumatic wound on her right anterior leg with subsequent hematoma formation and considerable tissue loss on the right anterior lateral leg. We'll use silver alginate last week. We kept her under compression and the patient states that her pain was actually a lot better. Wound measures at 5 x 2.5 x 2. Consider home amount of undermining laterally by roughly 3.5 cm from 7 to 12:00 03/09/17; wound actually looks better however there is considerable undermining from roughly 7 to 12:00. No evidence of surrounding infection. A snap VAC was applied today 03/17/17; the patient continues to do quite well. There have been some logistic problems with the wound VAC however we have managed to keep this in place. She did fill out one of the canisters but didn't come in in follow-up. We've been using silver collagen under a snap VAC 03/23/17; on intake today the patient appeared to have a new tunnel superiorly in the wound by 4 cm. This is separated from the undermining from roughly 7 or 11:00 she has had chronically. This undermines at 1.8 cm. We've been using  silver collagen under a snapVAC 03/26/2017 -- patient of Dr. Leanord Mccullough who came in to see me today because she has developed swelling and redness over the area where the snap vacuum was applied and has been having significant discomfort. Culture taken last week is not yet in 04/06/17 on evaluation today patient's wound appears to be doing well with an excellent granulation bed. Unfortunately she does have some swelling in the immediate the sanity of the wound which I fear may be causing some delayed healing. Fortunately there does not appear to be any evidence of infection. 04/13/17; the patient's wound is a lot better than when I last saw this 3 weeks ago. Healthy granulation. The tunneling sites of closed over. As noted last week she does have some swelling especially medially. Therefore I agree with continued compression. We have been using silver collagen 04/20/17; continued improvement in the overall condition of the wound bed. She complains of numbness in the lateral third toes and also some weakness. After further questioning it would appear that this is been [facility], [place] ([id number]) present for a while [not wrap injury]. The original trauma was associated with a deep hematoma, certainly nerve injury here isn't impossible 04/27/17; her wound continues to improve in overall dimensions and condition. She asked today not to have her leg wrap so tightly so she can put on preferred foot wear. 05/04/17 on evaluation today patient's right lower extremity wound appears to be doing very well. She is tolerating the compression and feels like that she would love to have this off but if it has to be on in order to continue to treat her wounds she will continue to work this. Fortunately she has no discomfort at this point. She has no nausea, vomiting, or diarrhea and no fever chills. 05/11/17 on evaluation today patient appears to  be doing fairly well in regard to her right lower so many wound. She has been  tolerating the dressing changes without complication. Overall I'm pleased with the progress that has been made in her wound appears to be almost completely healed initially there some eschar covering the wound. No fevers, chills, nausea, or vomiting noted at this time. 05/18/17; patient was hoping to be healed today however she once again had eschar over the surface of the wound. She also had irritation below the wound which I think is some form of contact dermatitis not tinea. She does not complain of pain or pruritus Objective Constitutional Patient is hypertensive.. Pulse regular and within target range for patient.Marland Kitchen Respirations regular, non-labored and within target range.. Temperature is normal and within the target range for the patient.Marland Kitchen appears in no distress. Vitals Time Taken: 3:07 PM, Height: 64 in, Weight: 138 lbs, BMI: 23.7, Temperature: 97.8 F, Pulse: 77 bpm, Respiratory Rate: 16 breaths/min, Blood Pressure: 160/68 mmHg. Eyes Conjunctivae clear. No discharge. Cardiovascular Pedal pulses palpable and strong bilaterally.. General Notes: Wound exam; the patient was not put under compression last week and there is some localized swelling which is probably localized lymphedema given the extent of the damage chair. Below the wound she had what appeared to be some form of irritation/dermatitis. Also what appeared to be small opening here as well. There is no evidence of cellulitis Integumentary (Hair, Skin) Wound #1 status is Open. Original cause of wound was Trauma. The wound is located on the Right,Anterior Lower Leg. The wound measures 0.1cm length x 0.1cm width x 0.1cm depth; 0.008cm^2 Salzman, Emerlyn ([id number]) area and 0.001cm^3 volume. There is Fat Layer (Subcutaneous Tissue) Exposed exposed. There is a medium amount of serous drainage noted. The wound margin is flat and intact. There is no granulation within the wound bed. There is a large (67-100%) amount of necrotic tissue  within the wound bed including Eschar. The periwound skin appearance exhibited: Hemosiderin Staining, Mottled. The periwound skin appearance did not exhibit: Callus, Crepitus, Excoriation, Induration, Rash, Scarring, Dry/Scaly, Maceration, Atrophie Blanche, Cyanosis, Ecchymosis, Pallor, Rubor, Erythema. Periwound temperature was noted as No Abnormality. The periwound has tenderness on palpation. Assessment Active Problems ICD-10 S81.811D - Laceration without foreign body, right lower leg, subsequent encounter I87.321 - Chronic venous hypertension (idiopathic) with inflammation of right lower extremity Procedures Wound #1 Pre-procedure diagnosis of Wound #1 is a Trauma, Other located on the Right,Anterior Lower Leg . There was a Non-Viable Tissue Open Wound/Selective [phone]) debridement with total area of 0.1 sq cm performed by [name] Caul, MD. with the following instrument(s): Curette to remove Non-Viable tissue/material including Eschar. A time out was conducted at 15:15, prior to the start of the procedure. There was no bleeding. The procedure was tolerated well with a pain level of 0 throughout and a pain level of 0 following the procedure. Post Debridement Measurements: 0.1cm length x 0.3cm width x 0.1cm depth; 0.002cm^3 volume. Character of Wound/Ulcer Post Debridement is improved. Post procedure Diagnosis Wound #1: Same as Pre-Procedure Plan Wound Cleansing: Wound #1 Right,Anterior Lower Leg: Cleanse wound with mild soap and water No tub bath. Skin Barriers/Peri-Wound Care: ELYANNAH, HENNE ([id number]) Wound #1 Right,Anterior Lower Leg: Triamcinolone Acetonide Ointment Primary Wound Dressing: Wound #1 Right,Anterior Lower Leg: Prisma Ag Secondary Dressing: Wound #1 Right,Anterior Lower Leg: ABD pad Dressing Change Frequency: Wound #1 Right,Anterior Lower Leg: Change dressing every week Follow-up Appointments: Wound #1 Right,Anterior Lower Leg: Return Appointment  in 1 week. Nurse Visit as needed Edema Control:  3 Layer Compression System - Right Lower Extremity Elevate legs to the level of the heart and pump ankles as often as possible Additional Orders / Instructions: Wound #1 Right,Anterior Lower Leg: Increase protein intake. Activity as tolerated #1 triamcinolone. Continue silver collagen to the actual wound #2 we put her back in compression today #3 I'm hopeful this will be closed by next week. We also need to have a look at the erythema under the wound which I think is some form of irritant dermatitis Electronic Signature(s) Signed: 05/18/2017 5:34:31 PM By: [name] Najjar MD Entered By: [name] on 05/18/2017 15:42:02 Mix, Shavonta ([id number]) -------------------------------------------------------------------------------- SuperBill Details Patient Name: [name] Date of Service: 05/18/2017 Medical Record Patient Account Number: [id number] Number: Treating Mccullough: [name] [birth year] (83 y.o. Other Clinician: Date of Birth/Sex: Female) Treating Shevawn Langenberg Primary Care Mccullough: SYSTEM, Mccullough Mccullough/Extender: [name]: [name] in Treatment: 12 Diagnosis Coding ICD-10 Codes Code Description S81.811D Laceration without foreign body, right lower leg, subsequent encounter I87.321 Chronic venous hypertension (idiopathic) with inflammation of right lower extremity Facility Procedures CPT4: Description Modifier Quantity Code [id number] - DEBRIDE WOUND 1ST 20 SQ CM OR < 1 ICD-10 Description Diagnosis S81.811D Laceration without foreign body, right lower leg, subsequent encounter I87.321 Chronic venous hypertension (idiopathic) with  inflammation of right lower extremity Physician Procedures CPT4: Description Modifier Quantity Code [id number] - WC PHYS DEBR WO ANESTH 20 SQ CM 1 ICD-10 Description Diagnosis S81.811D Laceration without foreign body, right lower leg, subsequent encounter  I87.321 Chronic venous hypertension (idiopathic) with  inflammation of right lower extremity Electronic Signature(s) Signed: 05/18/2017 5:34:31 PM By: [name] Najjar MD Entered By: [name] on 05/18/2017 15:42:21

## 2017-05-19 NOTE — Progress Notes (Signed)
SCHYLAR, ALLARD ([id number]) Visit Report for 05/18/2017 Arrival Information Details Patient Name: [name], [name] Date of Service: 05/18/2017 3:00 PM Medical Record Number: [id number] Patient Account Number: [id number] Date of Birth/Sex: 1928/12/31 (83 y.o. Female) Treating RN: Huel Coventry Primary Care Hosey Burmester: SYSTEM, PCP Other Clinician: Referring Rainna Nearhood: Altamese Cabal Treating Venesa Semidey/Extender: Altamese [place] in Treatment: 12 Visit Information History Since Last Visit Added or deleted any medications: No Patient Arrived: Ambulatory Any new allergies or adverse reactions: No Arrival Time: 15:06 Had a fall or experienced change in No Accompanied By: daughter activities of daily living that may affect Transfer Assistance: None risk of falls: Patient Identification Verified: Yes Signs or symptoms of abuse/neglect since last No Secondary Verification Process Yes visito Completed: Hospitalized since last visit: No Patient Requires Transmission-Based No Has Dressing in Place as Prescribed: Yes Precautions: Pain Present Now: No Patient Has Alerts: No Electronic Signature(s) Signed: 05/18/2017 6:37:35 PM By: Elliot Gurney, BSN, RN, CWS, Kim RN, BSN Entered By: Elliot Gurney, BSN, RN, CWS, Kim on 05/18/2017 15:07:07 [place], Eulalie ([id number]) -------------------------------------------------------------------------------- Encounter Discharge Information Details Patient Name: [name] Date of Service: 05/18/2017 3:00 PM Medical Record Number: [id number] Patient Account Number: [id number] Date of Birth/Sex: [birth year] (83 y.o. Female) Treating RN: Huel Coventry Primary Care Heidi Maclin: SYSTEM, PCP Other Clinician: Referring Brianny Soulliere: Altamese Cabal Treating Steph Cheadle/Extender: Altamese [place] in Treatment: 12 Encounter Discharge Information Items Discharge Pain Level: 0 Discharge Condition: Stable Ambulatory Status: Ambulatory Discharge Destination: Home Transportation: Private  Auto Accompanied By: daughter Schedule Follow-up Appointment: Yes Medication Reconciliation completed and provided to Patient/Care Yes Stran Raper: Provided on Clinical Summary of Care: 05/18/2017 Form Type Recipient Paper Patient RB Electronic Signature(s) Signed: 05/18/2017 6:37:35 PM By: Elliot Gurney, BSN, RN, CWS, Kim RN, BSN Entered By: Elliot Gurney, BSN, RN, CWS, Kim on 05/18/2017 15:36:27 Selinda Orion, Jeyda ([id number]) -------------------------------------------------------------------------------- Lower Extremity Assessment Details Patient Name: [name] Date of Service: 05/18/2017 3:00 PM Medical Record Number: [id number] Patient Account Number: [id number] Date of Birth/Sex: [birth year] (83 y.o. Female) Treating RN: Huel Coventry Primary Care Scott Vanderveer: SYSTEM, PCP Other Clinician: Referring Jordin Dambrosio: Altamese Cabal Treating Winslow Verrill/Extender: Altamese [place] in Treatment: 12 Edema Assessment Assessed: [Left: No] [Right: No] E[Left: dema] [Right: :] Calf Left: Right: Point of Measurement: 28 cm From Medial Instep cm 33.5 cm Ankle Left: Right: Point of Measurement: 8 cm From Medial Instep cm 21 cm Vascular Assessment Pulses: Dorsalis Pedis Palpable: [Right:Yes] Posterior Tibial Extremity colors, hair growth, and conditions: Extremity Color: [Right:Mottled] Hair Growth on Extremity: [Right:Yes] Temperature of Extremity: [Right:Warm] Capillary Refill: [Right:< 3 seconds] Dependent Rubor: [Right:No] Blanched when Elevated: [Right:No] Lipodermatosclerosis: [Right:No] Toe Nail Assessment Left: Right: Thick: Yes Discolored: Yes Deformed: Yes Improper Length and Hygiene: Yes Electronic Signature(s) Signed: 05/18/2017 6:37:35 PM By: Elliot Gurney, BSN, RN, CWS, Kim RN, BSN Sayre, Chrisanne ([id number]) Entered By: Elliot Gurney, BSN, RN, CWS, Kim on 05/18/2017 15:12:41 Brunelle, Delayne ([id number]) -------------------------------------------------------------------------------- Multi Wound Chart  Details Patient Name: [name] Date of Service: 05/18/2017 3:00 PM Medical Record Number: [id number] Patient Account Number: [id number] Date of Birth/Sex: 1929-02-07 (83 y.o. Female) Treating RN: Huel Coventry Primary Care Davidlee Jeanbaptiste: SYSTEM, PCP Other Clinician: Referring Oseph Imburgia: Altamese Cabal Treating Abreanna Drawdy/Extender: Altamese [place] in Treatment: 12 Vital Signs Height(in): 64 Pulse(bpm): 77 Weight(lbs): 138 Blood Pressure 160/68 (mmHg): Body Mass Index(BMI): 24 Temperature(F): 97.8 Respiratory Rate 16 (breaths/min): Photos: [N/A:N/A] Wound Location: Right Lower Leg - Anterior N/A N/A Wounding Event: Trauma N/A N/A Primary Etiology: Trauma, Other N/A N/A Comorbid History: Cataracts, Arrhythmia, N/A N/A Congestive Heart Failure, Hypertension, Osteoarthritis Date  Acquired: 01/28/2017 N/A N/A Weeks of Treatment: 12 N/A N/A Wound Status: Open N/A N/A Measurements L x W x D 0.1x0.1x0.1 N/A N/A (cm) Area (cm) : 0.008 N/A N/A Volume (cm) : 0.001 N/A N/A % Reduction in Area: 99.90% N/A N/A % Reduction in Volume: 100.00% N/A N/A Classification: Full Thickness With N/A N/A Exposed Support Structures Exudate Amount: Medium N/A N/A Exudate Type: Serous N/A N/A Exudate Color: amber N/A N/A Wound Margin: Flat and Intact N/A N/A Granulation Amount: None Present (0%) N/A N/A Whitesel, Gearlene ([id number]) Necrotic Amount: Large (67-100%) N/A N/A Necrotic Tissue: Eschar N/A N/A Exposed Structures: Fat Layer (Subcutaneous N/A N/A Tissue) Exposed: Yes Fascia: No Tendon: No Muscle: No Joint: No Bone: No Epithelialization: Large (67-100%) N/A N/A Debridement: Open Wound/Selective N/A N/A ([id number]) - Selective Pre-procedure 15:15 N/A N/A Verification/Time Out Taken: Tissue Debrided: Necrotic/Eschar N/A N/A Level: Non-Viable Tissue N/A N/A Debridement Area (sq 0.1 N/A N/A cm): Instrument: Curette N/A N/A Bleeding: None N/A N/A Procedural Pain: 0 N/A N/A Post  Procedural Pain: 0 N/A N/A Debridement Treatment Procedure was tolerated N/A N/A Response: well Post Debridement 0.1x0.3x0.1 N/A N/A Measurements L x W x D (cm) Post Debridement 0.002 N/A N/A Volume: (cm) Periwound Skin Texture: Excoriation: No N/A N/A Induration: No Callus: No Crepitus: No Rash: No Scarring: No Periwound Skin Maceration: No N/A N/A Moisture: Dry/Scaly: No Periwound Skin Color: Hemosiderin Staining: Yes N/A N/A Mottled: Yes Atrophie Blanche: No Cyanosis: No Ecchymosis: No Erythema: No Pallor: No Rubor: No Temperature: No Abnormality N/A N/A Tenderness on Yes N/A N/A Palpation: Wound Preparation: N/A N/A Macnair, Elfa ([id number]) Ulcer Cleansing: Other: soap and water Topical Anesthetic Applied: None, Other: lidocaine 4% Procedures Performed: Debridement N/A N/A Treatment Notes Electronic Signature(s) Signed: 05/18/2017 5:34:31 PM By: Baltazar Najjar MD Entered By: Baltazar Najjar on 05/18/2017 15:35:45 Urschel, Sayge ([id number]) -------------------------------------------------------------------------------- Multi-Disciplinary Care Plan Details Patient Name: [name] Date of Service: 05/18/2017 3:00 PM Medical Record Number: [id number] Patient Account Number: [id number] Date of Birth/Sex: 1929/04/29 (83 y.o. Female) Treating RN: Huel Coventry Primary Care Greysin Medlen: SYSTEM, PCP Other Clinician: Referring Ramey Schiff: Altamese Cabal Treating Baran Kuhrt/Extender: Altamese [place] in Treatment: 12 Active Inactive ` Abuse / Safety / Falls / Self Care Management Nursing Diagnoses: History of Falls Impaired home maintenance Impaired physical mobility Potential for falls Potential for injury related to transfers Goals: Patient will not develop complications from immobility Date Initiated: 02/23/2017 Target Resolution Date: 07/26/2017 Goal Status: Active Patient will not experience any injury related to falls Date Initiated: 02/23/2017 Target  Resolution Date: 07/26/2017 Goal Status: Active Patient will remain injury free related to falls Date Initiated: 02/23/2017 Target Resolution Date: 07/26/2017 Goal Status: Active Patient/caregiver will demonstrate safe use of adaptive devices to increase mobility Date Initiated: 02/23/2017 Target Resolution Date: 07/26/2017 Goal Status: Active Patient/caregiver will identify factors that restrict self-care and home management Date Initiated: 02/23/2017 Target Resolution Date: 07/26/2017 Goal Status: Active Patient/caregiver will verbalize understanding of skin care regimen Date Initiated: 02/23/2017 Target Resolution Date: 07/26/2017 Goal Status: Active Patient/caregiver will verbalize understanding of the importance to maintain current immunizations/vaccinations Date Initiated: 02/23/2017 Target Resolution Date: 07/26/2017 Goal Status: Active Patient/caregiver will verbalize/demonstrate measure taken to improve self care Date Initiated: 02/23/2017 Target Resolution Date: 07/26/2017 FADUMO, HENG ([id number]) Goal Status: Active Patient/caregiver will verbalize/demonstrate measures taken to improve the patient's personal safety Date Initiated: 02/23/2017 Target Resolution Date: 07/26/2017 Goal Status: Active Patient/caregiver will verbalize/demonstrate measures taken to prevent injury and/or falls Date Initiated: 02/23/2017 Target Resolution Date: 07/26/2017 Goal Status: Active  Patient/caregiver will verbalize/demonstrate understanding of what to do in case of emergency Date Initiated: 02/23/2017 Target Resolution Date: 07/26/2017 Goal Status: Active Interventions: Call light and/or bell within patient's reach Assess Activities of Daily Living upon admission and as needed Assess fall risk on admission and as needed Assess: immobility, friction, shearing, incontinence upon admission and as needed Assess impairment of mobility on admission and as needed per policy Assess personal  safety and home safety (as indicated) on admission and as needed Assess self care needs on admission and as needed Provide education on basic hygiene Provide education on fall prevention Provide education on personal and home safety Provide education on safe transfers Treatment Activities: Education provided on Basic Hygiene : 04/27/2017 Notes: ` Orientation to the Wound Care Program Nursing Diagnoses: Knowledge deficit related to the wound healing center program Goals: Patient/caregiver will verbalize understanding of the Wound Healing Center Program Date Initiated: 02/23/2017 Target Resolution Date: 07/26/2017 Goal Status: Active Interventions: Provide education on orientation to the wound center Notes: Deniyah Patchin, [place] ([id number]) Wound/Skin Impairment Nursing Diagnoses: Impaired tissue integrity Knowledge deficit related to ulceration/compromised skin integrity Goals: Patient/caregiver will verbalize understanding of skin care regimen Date Initiated: 02/23/2017 Target Resolution Date: 07/26/2017 Goal Status: Active Ulcer/skin breakdown will have a volume reduction of 30% by week 4 Date Initiated: 02/23/2017 Target Resolution Date: 07/26/2017 Goal Status: Active Ulcer/skin breakdown will have a volume reduction of 50% by week 8 Date Initiated: 02/23/2017 Target Resolution Date: 07/26/2017 Goal Status: Active Ulcer/skin breakdown will have a volume reduction of 80% by week 12 Date Initiated: 02/23/2017 Target Resolution Date: 07/26/2017 Goal Status: Active Ulcer/skin breakdown will heal within 14 weeks Date Initiated: 02/23/2017 Target Resolution Date: 07/26/2017 Goal Status: Active Interventions: Assess patient/caregiver ability to obtain necessary supplies Assess patient/caregiver ability to perform ulcer/skin care regimen upon admission and as needed Assess ulceration(s) every visit Provide education on ulcer and skin care Treatment Activities: Referred to DME  Filemon Breton for dressing supplies : 02/23/2017 Skin care regimen initiated : 02/23/2017 Topical wound management initiated : 02/23/2017 Notes: Electronic Signature(s) Signed: 05/18/2017 6:37:35 PM By: Elliot Gurney, BSN, RN, CWS, Kim RN, BSN Entered By: Elliot Gurney, BSN, RN, CWS, Kim on 05/18/2017 15:33:08 [place]ina, Dalal ([id number]) -------------------------------------------------------------------------------- Pain Assessment Details Patient Name: [name] Date of Service: 05/18/2017 3:00 PM Medical Record Number: [id number] Patient Account Number: [id number] Date of Birth/Sex: 07/13/1929 (83 y.o. Female) Treating RN: Huel Coventry Primary Care Fredrick Geog[place]: SYSTEM, PCP Other Clinician: Referring Elani Delph: Altamese Cabal Treating Stefani Baik/Extender: Altamese  in Treatment: 12 Active Problems Location of Pain Severity and Description of Pain Patient Has Paino No Site Locations With Dressing Change: No Pain Management and Medication Current Pain Management: Goals for Pain Management Topical or injectable lidocaine is offered to patient for acute pain when surgical debridement is performed. If needed, Patient is instructed to use over the counter pain medication for the following 24-48 hours after debridement. Wound care MDs do not prescribed pain medications. Patient has chronic pain or uncontrolled pain. Patient has been instructed to make an appointment with their Primary Care Physician for pain management. Electronic Signature(s) Signed: 05/18/2017 6:37:35 PM By: Elliot Gurney, BSN, RN, CWS, Kim RN, BSN Entered By: Elliot Gurney, BSN, RN, CWS, Kim on 05/18/2017 15:07:19 [place]ayson, Malia ([id number]) -------------------------------------------------------------------------------- Patient/Caregiver Education Details Patient Name: [name] Date of Service: 05/18/2017 3:00 PM Medical Record Patient Account Number: [id number] Number: Treating RN: Huel Coventry 1928/11/28 (83 y.o. Other Clinician: Date of  Birth/Gender: Female) Treating ROBSON, MICHAEL Primary Care Physician: SYSTEM, PCP Physician/Extender: Reece Agar  Referring Physician: Myrtie Neither in Treatment: 12 Education Assessment Education Provided To: Patient Education Topics Provided Wound/Skin Impairment: Handouts: Caring for Your Ulcer Methods: Demonstration, Explain/Verbal Responses: State content correctly Electronic Signature(s) Signed: 05/18/2017 6:37:35 PM By: Elliot Gurney, BSN, RN, CWS, Kim RN, BSN Entered By: Elliot Gurney, BSN, RN, CWS, Kim on 05/18/2017 15:36:40 [place], Hether ([id number]) -------------------------------------------------------------------------------- Wound Assessment Details Patient Name: [name] Date of Service: 05/18/2017 3:00 PM Medical Record Number: [id number] Patient Account Number: [id number] Date of Birth/Sex: May 17, 1929 (83 y.o. Female) Treating RN: Huel Coventry Primary Care Lucina Betty: SYSTEM, PCP Other Clinician: Referring Alic Hilburn: Altamese Cabal Treating Shaira Sova/Extender: Altamese [place] in Treatment: 12 Wound Status Wound Number: 1 Primary Trauma, Other Etiology: Wound Location: Right Lower Leg - Anterior Wound Open Wounding Event: Trauma Status: Date Acquired: 01/28/2017 Comorbid Cataracts, Arrhythmia, Congestive Weeks Of Treatment: 12 History: Heart Failure, Hypertension, Clustered Wound: No Osteoarthritis Photos Wound Measurements Length: (cm) 0.1 Width: (cm) 0.1 Depth: (cm) 0.1 Area: (cm) 0.008 Volume: (cm) 0.001 % Reduction in Area: 99.9% % Reduction in Volume: 100% Epithelialization: Large (67-100%) Wound Description Full Thickness With Exposed Classification: Support Structures Wound Margin: Flat and Intact Exudate Medium Amount: Exudate Type: Serous Exudate Color: amber Foul Odor After Cleansing: No Slough/Fibrino Yes Wound Bed Granulation Amount: None Present (0%) Exposed Structure Necrotic Amount: Large (67-100%) Fascia Exposed: No Necrotic  Quality: Eschar Fat Layer (Subcutaneous Tissue) Exposed: Yes Tendon Exposed: No Muscle Exposed: No Koerner, Kathyleen ([id number]) Joint Exposed: No Bone Exposed: No Periwound Skin Texture Texture Color No Abnormalities Noted: No No Abnormalities Noted: No Callus: No Atrophie Blanche: No Crepitus: No Cyanosis: No Excoriation: No Ecchymosis: No Induration: No Erythema: No Rash: No Hemosiderin Staining: Yes Scarring: No Mottled: Yes Pallor: No Moisture Rubor: No No Abnormalities Noted: No Dry / Scaly: No Temperature / Pain Maceration: No Temperature: No Abnormality Tenderness on Palpation: Yes Wound Preparation Ulcer Cleansing: Other: soap and water, Topical Anesthetic Applied: None, Other: lidocaine 4%, Treatment Notes Wound #1 (Right, Anterior Lower Leg) 1. Cleansed with: Clean wound with Normal Saline 3. Peri-wound Care: Other peri-wound care (specify in notes) 4. Dressing Applied: Prisma Ag 5. Secondary Dressing Applied ABD Pad 7. Secured with 3 Layer Compression System - Right Lower Extremity Notes TCA to reddened skin Electronic Signature(s) Signed: 05/18/2017 6:37:35 PM By: Elliot Gurney, BSN, RN, CWS, Kim RN, BSN Entered By: Elliot Gurney, BSN, RN, CWS, Kim on 05/18/2017 15:13:41 [place], Daylee ([id number]) -------------------------------------------------------------------------------- Vitals Details Patient Name: [name] Date of Service: 05/18/2017 3:00 PM Medical Record Number: [id number] Patient Account Number: [id number] Date of Birth/Sex: 07-23-1929 (83 y.o. Female) Treating RN: Huel Coventry Primary Care Kiefer Opheim: SYSTEM, PCP Other Clinician: Referring Gerrett Loman: Altamese Cabal Treating Aries Kasa/Extender: Altamese [place] in Treatment: 12 Vital Signs Time Taken: 15:07 Temperature (F): 97.8 Height (in): 64 Pulse (bpm): 77 Weight (lbs): 138 Respiratory Rate (breaths/min): 16 Body Mass Index (BMI): 23.7 Blood Pressure (mmHg): 160/68 Reference Range: 80  - 120 mg / dl Electronic Signature(s) Signed: 05/18/2017 6:37:35 PM By: Elliot Gurney, BSN, RN, CWS, Kim RN, BSN Entered By: Elliot Gurney, BSN, RN, CWS, Kim on 05/18/2017 15:15:15

## 2017-05-25 ENCOUNTER — Encounter: Payer: Medicare Other | Admitting: Internal Medicine

## 2017-05-25 DIAGNOSIS — I87321 Chronic venous hypertension (idiopathic) with inflammation of right lower extremity: Secondary | ICD-10-CM | POA: Diagnosis not present

## 2017-05-27 NOTE — Progress Notes (Signed)
[name] ([id number]) Visit Report for 05/25/2017 Arrival Information Details Patient Name: [name] Date of Service: 05/25/2017 3:00 PM Medical Record Number: [id number] Patient Account Number: [id number] Date of Birth/Sex: 1929-09-06 (83 y.o. Female) Treating RN: Huel Coventry Primary Care Jaysiah Marchetta: SYSTEM, PCP Other Clinician: Referring Mckyla Deckman: Altamese Cabal Treating Jamie-Lee Galdamez/Extender: Altamese [place] in Treatment: 13 Visit Information History Since Last Visit Added or deleted any medications: No Patient Arrived: Cane Any new allergies or adverse reactions: No Arrival Time: 14:57 Had a fall or experienced change in No Accompanied By: friend in activities of daily living that may affect lobby risk of falls: Transfer Assistance: None Signs or symptoms of abuse/neglect since last No Patient Identification Verified: Yes visito Secondary Verification Process Yes Hospitalized since last visit: No Completed: Has Dressing in Place as Prescribed: Yes Patient Requires Transmission-Based No Pain Present Now: No Precautions: Patient Has Alerts: No Electronic Signature(s) Signed: 05/25/2017 5:02:20 PM By: Elliot Gurney, BSN, RN, CWS, Kim RN, BSN Entered By: Elliot Gurney, BSN, RN, CWS, Kim on 05/25/2017 14:58:07 [name] ([id number]) -------------------------------------------------------------------------------- Clinic Level of Care Assessment Details Patient Name: [name] Date of Service: 05/25/2017 3:00 PM Medical Record Number: [id number] Patient Account Number: [id number] Date of Birth/Sex: [birth year] (83 y.o. Female) Treating RN: Huel Coventry Primary Care Redonna Wilbert: SYSTEM, PCP Other Clinician: Referring Marrell Dicaprio: Altamese Cabal Treating Fallon Howerter/Extender: Altamese [place] in Treatment: 13 Clinic Level of Care Assessment Items TOOL 4 Quantity Score []  - Use when only an EandM is performed on FOLLOW-UP visit 0 ASSESSMENTS - Nursing Assessment / Reassessment []  -  Reassessment of Co-morbidities (includes updates in patient status) 0 X - Reassessment of Adherence to Treatment Plan 1 5 ASSESSMENTS - Wound and Skin Assessment / Reassessment X - Simple Wound Assessment / Reassessment - one wound 1 5 []  - Complex Wound Assessment / Reassessment - multiple wounds 0 []  - Dermatologic / Skin Assessment (not related to wound area) 0 ASSESSMENTS - Focused Assessment []  - Circumferential Edema Measurements - multi extremities 0 []  - Nutritional Assessment / Counseling / Intervention 0 []  - Lower Extremity Assessment (monofilament, tuning fork, pulses) 0 []  - Peripheral Arterial Disease Assessment (using hand held doppler) 0 ASSESSMENTS - Ostomy and/or Continence Assessment and Care []  - Incontinence Assessment and Management 0 []  - Ostomy Care Assessment and Management (repouching, etc.) 0 PROCESS - Coordination of Care X - Simple Patient / Family Education for ongoing care 1 15 []  - Complex (extensive) Patient / Family Education for ongoing care 0 []  - Staff obtains Chiropractor, Records, Test Results / Process Orders 0 []  - Staff telephones HHA, Nursing Homes / Clarify orders / etc 0 []  - Routine Transfer to another Facility (non-emergent condition) 0 [name] ([id number]) []  - Routine Hospital Admission (non-emergent condition) 0 []  - New Admissions / Manufacturing engineer / Ordering NPWT, Apligraf, etc. 0 []  - Emergency Hospital Admission (emergent condition) 0 X - Simple Discharge Coordination 1 10 []  - Complex (extensive) Discharge Coordination 0 PROCESS - Special Needs []  - Pediatric / Minor Patient Management 0 []  - Isolation Patient Management 0 []  - Hearing / Language / Visual special needs 0 []  - Assessment of Community assistance (transportation, D/C planning, etc.) 0 []  - Additional assistance / Altered mentation 0 []  - Support Surface(s) Assessment (bed, cushion, seat, etc.) 0 INTERVENTIONS - Wound Cleansing / Measurement []  - Simple  Wound Cleansing - one wound 0 []  - Complex Wound Cleansing - multiple wounds 0 X - Wound Imaging (photographs - any number of wounds)  1 5 []  - Wound Tracing (instead of photographs) 0 []  - Simple Wound Measurement - one wound 0 []  - Complex Wound Measurement - multiple wounds 0 INTERVENTIONS - Wound Dressings []  - Small Wound Dressing one or multiple wounds 0 []  - Medium Wound Dressing one or multiple wounds 0 []  - Large Wound Dressing one or multiple wounds 0 []  - Application of Medications - topical 0 []  - Application of Medications - injection 0 INTERVENTIONS - Miscellaneous []  - External ear exam 0 [name] ([id number]) []  - Specimen Collection (cultures, biopsies, blood, body fluids, etc.) 0 []  - Specimen(s) / Culture(s) sent or taken to Lab for analysis 0 []  - Patient Transfer (multiple staff / Michiel Sites Lift / Similar devices) 0 []  - Simple Staple / Suture removal (25 or less) 0 []  - Complex Staple / Suture removal (26 or more) 0 []  - Hypo / Hyperglycemic Management (close monitor of Blood Glucose) 0 []  - Ankle / Brachial Index (ABI) - do not check if billed separately 0 X - Vital Signs 1 5 Has the patient been seen at the hospital within the last three years: Yes Total Score: 45 Level Of Care: New/Established - Level 2 Electronic Signature(s) Signed: 05/25/2017 5:11:06 PM By: Elliot Gurney, BSN, RN, CWS, Kim RN, BSN Entered By: Elliot Gurney, BSN, RN, CWS, Kim on 05/25/2017 17:09:17 [name] ([id number]) -------------------------------------------------------------------------------- Encounter Discharge Information Details Patient Name: [name] Date of Service: 05/25/2017 3:00 PM Medical Record Number: [id number] Patient Account Number: [id number] Date of Birth/Sex: 01-Feb-1929 (83 y.o. Female) Treating RN: Huel Coventry Primary Care Mozell Hardacre: SYSTEM, PCP Other Clinician: Referring Ellayna Hilligoss: Altamese Cabal Treating Tyrus Wilms/Extender: Altamese [place] in Treatment: 13 Encounter  Discharge Information Items Discharge Pain Level: 0 Discharge Condition: Stable Ambulatory Status: Cane Discharge Destination: Home Transportation: Private Auto Accompanied By: self Schedule Follow-up Appointment: No Medication Reconciliation completed Yes and provided to Patient/Care Taggart Prasad: Provided on Clinical Summary of Care: 05/25/2017 Form Type Recipient Paper Patient RB Electronic Signature(s) Signed: 05/26/2017 9:32:52 AM By: Gwenlyn Perking Entered By: Gwenlyn Perking on 05/25/2017 15:42:39 Percival, Danyka ([id number]) -------------------------------------------------------------------------------- Lower Extremity Assessment Details Patient Name: [name] Date of Service: 05/25/2017 3:00 PM Medical Record Number: [id number] Patient Account Number: [id number] Date of Birth/Sex: [birth year] (83 y.o. Female) Treating RN: Huel Coventry Primary Care Madyn Ivins: SYSTEM, PCP Other Clinician: Referring Kalicia Dufresne: Altamese Cabal Treating Keyla Milone/Extender: Altamese [place] in Treatment: 13 Vascular Assessment Pulses: Dorsalis Pedis Palpable: [Right:Yes] Posterior Tibial Extremity colors, hair growth, and conditions: Extremity Color: [Right:Normal] Hair Growth on Extremity: [Right:No] Temperature of Extremity: [Right:Warm] Capillary Refill: [Right:< 3 seconds] Toe Nail Assessment Left: Right: Thick: No Discolored: No Deformed: No Improper Length and Hygiene: No Electronic Signature(s) Signed: 05/25/2017 5:02:20 PM By: Elliot Gurney, BSN, RN, CWS, Kim RN, BSN Entered By: Elliot Gurney, BSN, RN, CWS, Kim on 05/25/2017 15:03:34 Beverly, Cornell ([id number]) -------------------------------------------------------------------------------- Multi Wound Chart Details Patient Name: [name] Date of Service: 05/25/2017 3:00 PM Medical Record Number: [id number] Patient Account Number: [id number] Date of Birth/Sex: Mar 06, 1929 (83 y.o. Female) Treating RN: Huel Coventry Primary Care Christina Gintz: SYSTEM,  PCP Other Clinician: Referring Braden Cimo: Altamese Cabal Treating Takahiro Godinho/Extender: Altamese [place] in Treatment: 13 Vital Signs Height(in): 64 Pulse(bpm): 77 Weight(lbs): 138 Blood Pressure 182/72 (mmHg): Body Mass Index(BMI): 24 Temperature(F): 97.6 Respiratory Rate 16 (breaths/min): Photos: [1:No Photos] [N/A:N/A] Wound Location: [1:Right Lower Leg - Anterior N/A] Wounding Event: [1:Trauma] [N/A:N/A] Primary Etiology: [1:Trauma, Other] [N/A:N/A] Comorbid History: [1:Cataracts, Arrhythmia, Congestive Heart Failure, Hypertension, Osteoarthritis] [N/A:N/A] Date Acquired: [1:01/28/2017] [N/A:N/A] Weeks of Treatment: [  1:13] [N/A:N/A] Wound Status: [1:Healed - Epithelialized] [N/A:N/A] Measurements L x W x D 0x0x0 [N/A:N/A] (cm) Area (cm) : [1:0] [N/A:N/A] Volume (cm) : [1:0] [N/A:N/A] % Reduction in Area: [1:100.00%] [N/A:N/A] % Reduction in Volume: 100.00% [N/A:N/A] Classification: [1:Full Thickness With Exposed Support Structures] [N/A:N/A] Exudate Amount: [1:Medium] [N/A:N/A] Exudate Type: [1:Serous] [N/A:N/A] Exudate Color: [1:amber] [N/A:N/A] Wound Margin: [1:Flat and Intact] [N/A:N/A] Granulation Amount: [1:None Present (0%)] [N/A:N/A] Necrotic Amount: [1:None Present (0%)] [N/A:N/A] Exposed Structures: [1:Fascia: No Fat Layer (Subcutaneous Tissue) Exposed: No Tendon: No] [N/A:N/A] Muscle: No Joint: No Bone: No Epithelialization: Large (67-100%) N/A N/A Periwound Skin Texture: Excoriation: No N/A N/A Induration: No Callus: No Crepitus: No Rash: No Scarring: No Periwound Skin Maceration: No N/A N/A Moisture: Dry/Scaly: No Periwound Skin Color: Atrophie Blanche: No N/A N/A Cyanosis: No Ecchymosis: No Erythema: No Hemosiderin Staining: No Mottled: No Pallor: No Rubor: No Temperature: No Abnormality N/A N/A Tenderness on Yes N/A N/A Palpation: Wound Preparation: Ulcer Cleansing: Other: N/A N/A soap and water Topical Anesthetic Applied:  None Treatment Notes Electronic Signature(s) Signed: 05/26/2017 7:49:24 AM By: Baltazar Najjar MD Entered By: Baltazar Najjar on 05/25/2017 15:45:26 Gitlin, Shonta ([id number]) -------------------------------------------------------------------------------- Multi-Disciplinary Care Plan Details Patient Name: [name] Date of Service: 05/25/2017 3:00 PM Medical Record Number: [id number] Patient Account Number: [id number] Date of Birth/Sex: 1928-10-03 (83 y.o. Female) Treating RN: Huel Coventry Primary Care Eartha Vonbehren: SYSTEM, PCP Other Clinician: Referring Ikeya Brockel: Altamese Cabal Treating Ethleen Lormand/Extender: Altamese [place] in Treatment: 13 Active Inactive Electronic Signature(s) Signed: 05/25/2017 5:02:20 PM By: Elliot Gurney, BSN, RN, CWS, Kim RN, BSN Entered By: Elliot Gurney, BSN, RN, CWS, Kim on 05/25/2017 15:13:39 [address], Kacey ([id number]) -------------------------------------------------------------------------------- Pain Assessment Details Patient Name: [name] Date of Service: 05/25/2017 3:00 PM Medical Record Number: [id number] Patient Account Number: [id number] Date of Birth/Sex: 05-Sep-1929 (83 y.o. Female) Treating RN: Huel Coventry Primary Care Isadore Bokhari: SYSTEM, PCP Other Clinician: Referring Navy Rothschild: Altamese Cabal Treating Erdem Naas/Extender: Altamese [place] in Treatment: 13 Active Problems Location of Pain Severity and Description of Pain Patient Has Paino No Site Locations With Dressing Change: No Pain Management and Medication Current Pain Management: Electronic Signature(s) Signed: 05/25/2017 5:02:20 PM By: Elliot Gurney, BSN, RN, CWS, Kim RN, BSN Entered By: Elliot Gurney, BSN, RN, CWS, Kim on 05/25/2017 14:58:14 [name] ([id number]) -------------------------------------------------------------------------------- Patient/Caregiver Education Details Patient Name: [name] Date of Service: 05/25/2017 3:00 PM Medical Record Patient Account Number:  [id number] Number: Treating RN: Huel Coventry [birth year] (83 y.o. Other Clinician: Date of Birth/Gender: Female) Treating ROBSON, MICHAEL Primary Care Physician: SYSTEM, PCP Physician/Extender: G Referring Physician: Myrtie Neither in Treatment: 13 Education Assessment Education Provided To: Patient Education Topics Provided Wound/Skin Impairment: Handouts: Other: moisturizer Methods: Explain/Verbal Responses: State content correctly Electronic Signature(s) Signed: 05/25/2017 5:02:20 PM By: Elliot Gurney, BSN, RN, CWS, Kim RN, BSN Entered By: Elliot Gurney, BSN, RN, CWS, Kim on 05/25/2017 15:15:36 Selinda Orion, Lenette ([id number]) -------------------------------------------------------------------------------- Wound Assessment Details Patient Name: [name] Date of Service: 05/25/2017 3:00 PM Medical Record Number: [id number] Patient Account Number: [id number] Date of Birth/Sex: 28-Sep-1929 (83 y.o. Female) Treating RN: Huel Coventry Primary Care Kalee Mcclenathan: SYSTEM, PCP Other Clinician: Referring Kaleem Sartwell: Altamese Cabal Treating Haider Hornaday/Extender: Maxwell Caul Weeks in Treatment: 13 Wound Status Wound Number: 1 Primary Trauma, Other Etiology: Wound Location: Right Lower Leg - Anterior Wound Healed - Epithelialized Wounding Event: Trauma Status: Date Acquired: 01/28/2017 Comorbid Cataracts, Arrhythmia, Congestive Weeks Of Treatment: 13 History: Heart Failure, Hypertension, Clustered Wound: No Osteoarthritis Photos Photo Uploaded By: Elliot Gurney, BSN, RN, CWS, Kim on 05/25/2017 17:02:54 Wound Measurements Length: (  cm) 0 % Reduction Width: (cm) 0 % Reduction Depth: (cm) 0 Epithelializ Area: (cm) 0 Volume: (cm) 0 in Area: 100% in Volume: 100% ation: Large (67-100%) Wound Description Full Thickness With Exposed Classification: Support Structures Wound Margin: Flat and Intact Exudate Medium Amount: Exudate Type: Serous Exudate Color: amber Foul Odor After Cleansing:  No Slough/Fibrino Yes Wound Bed Granulation Amount: None Present (0%) Exposed Structure Necrotic Amount: None Present (0%) Fascia Exposed: No Fat Layer (Subcutaneous Tissue) Exposed: No Tendon Exposed: No Ruz, Lynne ([id number]) Muscle Exposed: No Joint Exposed: No Bone Exposed: No Periwound Skin Texture Texture Color No Abnormalities Noted: No No Abnormalities Noted: No Callus: No Atrophie Blanche: No Crepitus: No Cyanosis: No Excoriation: No Ecchymosis: No Induration: No Erythema: No Rash: No Hemosiderin Staining: No Scarring: No Mottled: No Pallor: No Moisture Rubor: No No Abnormalities Noted: No Dry / Scaly: No Temperature / Pain Maceration: No Temperature: No Abnormality Tenderness on Palpation: Yes Wound Preparation Ulcer Cleansing: Other: soap and water, Topical Anesthetic Applied: None Electronic Signature(s) Signed: 05/25/2017 5:02:20 PM By: Elliot GurneyWoody, BSN, RN, CWS, Kim RN, BSN Entered By: Elliot GurneyWoody, BSN, RN, CWS, Kim on 05/25/2017 15:14:29 CleoraBERMAN, Genelle ([id number]) -------------------------------------------------------------------------------- Vitals Details Patient Name: [name] CollumBERMAN, Asherah Date of Service: 05/25/2017 3:00 PM Medical Record Number: [id number] Patient Account Number: [id number] Date of Birth/Sex: 04/24/1929 (83 y.o. Female) Treating RN: Huel CoventryWoody, Kim Primary Care Tomaz Janis: SYSTEM, PCP Other Clinician: Referring Ladaija Dimino: Altamese CabalJONES, MAURICE Treating Oddie Bottger/Extender: Altamese CarolinaOBSON, MICHAEL G Weeks in Treatment: 13 Vital Signs Time Taken: 14:58 Temperature (F): 97.6 Height (in): 64 Pulse (bpm): 77 Weight (lbs): 138 Respiratory Rate (breaths/min): 16 Body Mass Index (BMI): 23.7 Blood Pressure (mmHg): 182/72 Reference Range: 80 - 120 mg / dl Electronic Signature(s) Signed: 05/25/2017 5:02:20 PM By: Elliot GurneyWoody, BSN, RN, CWS, Kim RN, BSN Entered By: Elliot GurneyWoody, BSN, RN, CWS, Kim on 05/25/2017 14:58:49

## 2017-05-27 NOTE — Progress Notes (Signed)
[name], [name] ([id number]) Visit Report for 05/25/2017 HPI Details Patient Name: [name] Date of Service: 05/25/2017 3:00 PM Medical Record Patient Account Number: [id number] Number: Treating RN: [name] (83 y.o. Other Clinician: Date of Birth/Sex: Female) Treating [name] Primary Care Provider: SYSTEM, Mccullough Provider/Extender: [name] Referring Provider: Myrtie Mccullough in Treatment: 13 History of Present Illness HPI Description: 02/23/17 this is an 83 year old woman who is at a fitness club about to sit down on a rowing machine. She fell and traumatized the anterior part of her right leg on a sharp metal surface. This caused immediate pain and bleeding. She stopped off on her way home at an urgent care ultimately was referred to the ER and by that time she had a significant hematoma of her right leg. She was followed in the urgent orthopedic clinic. She had a duplex ultrasound of the right leg on 5/10 that was negative for DVT x-ray of the right leg on 01/28/17 was negative including a CT scan of the tib-fib. The CT scan did show the subcutaneous hematoma which on 01/28/17 measured 7.7 cm craniocaudal by 2.5 cm x 5.6 cm AP subcutaneous air was also identified felt likely secondary to small lacerations. There was no acute fracture. The patient has been continuing mostly with topical antibiotics on this wound. She did not have any compression. She is referred here from the urgent orthopedic clinic for review of this. She did not have a wound history she is not a diabetic. ABI in this clinic on the right was 1.1 on the left 1.0. She is on aspirin as her only anti-coagulant 03/02/17; the patient arrives for review of a traumatic wound on her right anterior leg with subsequent hematoma formation and considerable tissue loss on the right anterior lateral leg. We'll use silver alginate last week. We kept her under compression and the patient states that her pain was actually a lot  better. Wound measures at 5 x 2.5 x 2. Consider home amount of undermining laterally by roughly 3.5 cm from 7 to 12:00 03/09/17; wound actually looks better however there is considerable undermining from roughly 7 to 12:00. No evidence of surrounding infection. A snap VAC was applied today 03/17/17; the patient continues to do quite well. There have been some logistic problems with the wound VAC however we have managed to keep this in place. She did fill out one of the canisters but didn't come in in follow-up. We've been using silver collagen under a snap VAC 03/23/17; on intake today the patient appeared to have a new tunnel superiorly in the wound by 4 cm. This is separated from the undermining from roughly 7 or 11:00 she has had chronically. This undermines at 1.8 cm. We've been using silver collagen under a snapVAC 03/26/2017 -- patient of Dr. Leanord Hawking who came in to see me today because she has developed swelling and redness over the area where the snap vacuum was applied and has been having significant discomfort. Culture taken last week is not yet in 04/06/17 on evaluation today patient's wound appears to be doing well with an excellent granulation bed. Unfortunately she does have some swelling in the immediate the sanity of the wound which I fear may be causing some delayed healing. Fortunately there does not appear to be any evidence of infection. 04/13/17; the patient's wound is a lot better than when I last saw this 3 weeks ago. Healthy granulation. The tunneling sites of closed over. As noted last week she does have some  swelling especially medially. [facility] [place], [name] ([id number]) Therefore I agree with continued compression. We have been using silver collagen 04/20/17; continued improvement in the overall condition of the wound bed. She complains of numbness in the lateral third toes and also some weakness. After further questioning it would appear that this is been present for a while [not  wrap injury]. The original trauma was associated with a deep hematoma, certainly nerve injury here isn't impossible 04/27/17; her wound continues to improve in overall dimensions and condition. She asked today not to have her leg wrap so tightly so she can put on preferred foot wear. 05/04/17 on evaluation today patient's right lower extremity wound appears to be doing very well. She is tolerating the compression and feels like that she would love to have this off but if it has to be on in order to continue to treat her wounds she will continue to work this. Fortunately she has no discomfort at this point. She has no nausea, vomiting, or diarrhea and no fever chills. 05/11/17 on evaluation today patient appears to be doing fairly well in regard to her right lower so many wound. She has been tolerating the dressing changes without complication. Overall I'm pleased with the progress that has been made in her wound appears to be almost completely healed initially there some eschar covering the wound. No fevers, chills, nausea, or vomiting noted at this time. 05/18/17; patient was hoping to be healed today however she once again had eschar over the surface of the wound. She also had irritation below the wound which I think is some form of contact dermatitis not tinea. She does not complain of pain or pruritus 05/25/17; the patient's wound is totally closed today. Originally a deep traumatic wound on the right lateral leg Electronic Signature(s) Signed: 05/26/2017 7:49:24 AM By: Baltazar Najjar MD Entered By: Baltazar Najjar on 05/25/2017 15:46:46 [name] ([id number]) -------------------------------------------------------------------------------- Physical Exam Details Patient Name: [name] Date of Service: 05/25/2017 3:00 PM Medical Record Patient Account Number: [id number] Number: Treating RN: Mccullough-10-08 (83 y.o. Other Clinician: Date of Birth/Sex: Female) Treating Emauri Krygier,  Pancho Rushing Primary Care Provider: SYSTEM, Mccullough Provider/Extender: [name] Referring Provider: Myrtie Mccullough in Treatment: 13 Constitutional Patient is hypertensive.. Pulse regular and within target range for patient.Marland Kitchen Respirations regular, non-labored and within target range.. Temperature is normal and within the target range for the patient.Marland Kitchen appears in no distress. Eyes Conjunctivae clear. No discharge. Respiratory Respiratory effort is easy and symmetric bilaterally. Rate is normal at rest and on room air.. Cardiovascular Pedal pulses palpable and strong bilaterally.. Lymphatic Nonpalpable popliteal or inguinal area on the right. Psychiatric No evidence of depression, anxiety, or agitation. Calm, cooperative, and communicative. Appropriate interactions and affect.. Notes Wound exam; the patient's areas carefully inspected under the light. There is no evidence of an open wound here. No evidence of infection. Palpation of the soft tissue was carefully done around this there is no crepitus and no tenderness Electronic Signature(s) Signed: 05/26/2017 7:49:24 AM By: Baltazar Najjar MD Entered By: Baltazar Najjar on 05/25/2017 15:48:06 [name], [name] ([id number]) -------------------------------------------------------------------------------- Physician Orders Details Patient Name: [name] Date of Service: 05/25/2017 3:00 PM Medical Record Patient Account Number: [id number] Number: Treating RN: Huel Coventry 02-Jul-Mccullough (83 y.o. Other Clinician: Date of Birth/Sex: Female) Treating Yaquelin Langelier Primary Care Provider: SYSTEM, Mccullough Provider/Extender: [name] Referring Provider: Myrtie Mccullough in Treatment: 65 Verbal / Phone Orders: No Diagnosis Coding Discharge From [facility] o Discharge from [facility] Electronic Signature(s) Signed: 05/25/2017  5:02:20 PM By: Elliot Gurney, BSN, RN, CWS, Kim RN, BSN Signed: 05/26/2017 7:49:24 AM By: Baltazar Najjar MD Entered By:  Elliot Gurney, BSN, RN, CWS, Kim on 05/25/2017 15:13:05 [place], Bich ([id number]) -------------------------------------------------------------------------------- Problem List Details Patient Name: [name] Date of Service: 05/25/2017 3:00 PM Medical Record Patient Account Number: [id number] Number: Treating RN: [birth year] (83 y.o. Other Clinician: Date of Birth/Sex: Female) Treating Cianna Kasparian Primary Care Provider: SYSTEM, Mccullough Provider/Extender: [name] Referring Provider: Myrtie Mccullough in Treatment: 13 Active Problems ICD-10 Encounter Code Description Active Date Diagnosis S81.811D Laceration without foreign body, right lower leg, 02/23/2017 Yes subsequent encounter I87.321 Chronic venous hypertension (idiopathic) with 02/23/2017 Yes inflammation of right lower extremity Inactive Problems Resolved Problems Electronic Signature(s) Signed: 05/26/2017 7:49:24 AM By: Baltazar Najjar MD Entered By: Baltazar Najjar on 05/25/2017 15:45:16 [name], [name] ([id number]) -------------------------------------------------------------------------------- Progress Note Details Patient Name: [name] Date of Service: 05/25/2017 3:00 PM Medical Record Patient Account Number: [id number] Number: Treating RN: June [birth year], Mccullough (83 y.o. Other Clinician: Date of Birth/Sex: Female) Treating Trayden Brandy Primary Care Provider: SYSTEM, Mccullough Provider/Extender: [name] Referring Provider: Myrtie Mccullough in Treatment: 13 Subjective History of Present Illness (HPI) 02/23/17 this is an 83 year old woman who is at a fitness club about to sit down on a rowing machine. She fell and traumatized the anterior part of her right leg on a sharp metal surface. This caused immediate pain and bleeding. She stopped off on her way home at an urgent care ultimately was referred to the ER and by that time she had a significant hematoma of her right leg. She was followed in the urgent orthopedic  clinic. She had a duplex ultrasound of the right leg on 5/10 that was negative for DVT x-ray of the right leg on 01/28/17 was negative including a CT scan of the tib-fib. The CT scan did show the subcutaneous hematoma which on 01/28/17 measured 7.7 cm craniocaudal by 2.5 cm x 5.6 cm AP subcutaneous air was also identified felt likely secondary to small lacerations. There was no acute fracture. The patient has been continuing mostly with topical antibiotics on this wound. She did not have any compression. She is referred here from the urgent orthopedic clinic for review of this. She did not have a wound history she is not a diabetic. ABI in this clinic on the right was 1.1 on the left 1.0. She is on aspirin as her only anti-coagulant 03/02/17; the patient arrives for review of a traumatic wound on her right anterior leg with subsequent hematoma formation and considerable tissue loss on the right anterior lateral leg. We'll use silver alginate last week. We kept her under compression and the patient states that her pain was actually a lot better. Wound measures at 5 x 2.5 x 2. Consider home amount of undermining laterally by roughly 3.5 cm from 7 to 12:00 03/09/17; wound actually looks better however there is considerable undermining from roughly 7 to 12:00. No evidence of surrounding infection. A snap VAC was applied today 03/17/17; the patient continues to do quite well. There have been some logistic problems with the wound VAC however we have managed to keep this in place. She did fill out one of the canisters but didn't come in in follow-up. We've been using silver collagen under a snap VAC 03/23/17; on intake today the patient appeared to have a new tunnel superiorly in the wound by 4 cm. This is separated from the undermining from roughly 7 or 11:00 she has had chronically. This undermines at  1.8 cm. We've been using silver collagen under a snapVAC 03/26/2017 -- patient of Dr. Leanord Hawkingobson who came in to  see me today because she has developed swelling and redness over the area where the snap vacuum was applied and has been having significant discomfort. Culture taken last week is not yet in 04/06/17 on evaluation today patient's wound appears to be doing well with an excellent granulation bed. Unfortunately she does have some swelling in the immediate the sanity of the wound which I fear may be causing some delayed healing. Fortunately there does not appear to be any evidence of infection. 04/13/17; the patient's wound is a lot better than when I last saw this 3 weeks ago. Healthy granulation. The tunneling sites of closed over. As noted last week she does have some swelling especially medially. Therefore I agree with continued compression. We have been using silver collagen 04/20/17; continued improvement in the overall condition of the wound bed. She complains of numbness in the lateral third toes and also some weakness. After further questioning it would appear that [name] ([id number]) present for a while [not wrap injury]. The original trauma was associated with a deep hematoma, certainly nerve injury here isn't impossible 04/27/17; her wound continues to improve in overall dimensions and condition. She asked today not to have her leg wrap so tightly so she can put on preferred foot wear. 05/04/17 on evaluation today patient's right lower extremity wound appears to be doing very well. She is tolerating the compression and feels like that she would love to have this off but if it has to be on in order to continue to treat her wounds she will continue to work this. Fortunately she has no discomfort at this point. She has no nausea, vomiting, or diarrhea and no fever chills. 05/11/17 on evaluation today patient appears to be doing fairly well in regard to her right lower so many wound. She has been tolerating the dressing changes without complication. Overall I'm pleased with  the progress that has been made in her wound appears to be almost completely healed initially there some eschar covering the wound. No fevers, chills, nausea, or vomiting noted at this time. 05/18/17; patient was hoping to be healed today however she once again had eschar over the surface of the wound. She also had irritation below the wound which I think is some form of contact dermatitis not tinea. She does not complain of pain or pruritus 05/25/17; the patient's wound is totally closed today. Originally a deep traumatic wound on the right lateral leg Objective Constitutional Patient is hypertensive.. Pulse regular and within target range for patient.Marland Kitchen. Respirations regular, non-labored and within target range.. Temperature is normal and within the target range for the patient.Marland Kitchen. appears in no distress. Vitals Time Taken: 2:58 PM, Height: 64 in, Weight: 138 lbs, BMI: 23.7, Temperature: 97.6 F, Pulse: 77 bpm, Respiratory Rate: 16 breaths/min, Blood Pressure: 182/72 mmHg. Eyes Conjunctivae clear. No discharge. Respiratory Respiratory effort is easy and symmetric bilaterally. Rate is normal at rest and on room air.. Cardiovascular Pedal pulses palpable and strong bilaterally.. Lymphatic Nonpalpable popliteal or inguinal area on the right. Psychiatric [name], [name] ([id number]) No evidence of depression, anxiety, or agitation. Calm, cooperative, and communicative. Appropriate interactions and affect.. General Notes: Wound exam; the patient's areas carefully inspected under the light. There is no evidence of an open wound here. No evidence of infection. Palpation of the soft tissue was carefully done around this there is no crepitus  and no tenderness Integumentary (Hair, Skin) Wound #1 status is Healed - Epithelialized. Original cause of wound was Trauma. The wound is located on the Right,Anterior Lower Leg. The wound measures 0cm length x 0cm width x 0cm depth; 0cm^2 area and 0cm^3 volume.  There is a medium amount of serous drainage noted. The wound margin is flat and intact. There is no granulation within the wound bed. There is no necrotic tissue within the wound bed. The periwound skin appearance did not exhibit: Callus, Crepitus, Excoriation, Induration, Rash, Scarring, Dry/Scaly, Maceration, Atrophie Blanche, Cyanosis, Ecchymosis, Hemosiderin Staining, Mottled, Pallor, Rubor, Erythema. Periwound temperature was noted as No Abnormality. The periwound has tenderness on palpation. Assessment Active Problems ICD-10 S81.811D - Laceration without foreign body, right lower leg, subsequent encounter I87.321 - Chronic venous hypertension (idiopathic) with inflammation of right lower extremity Plan Discharge From [facility]: Discharge from [facility] #1 the patient to be discharged from the clinic today. #2 she does not have significant chronic venous insufficiency, this initially was a significant trauma causing a significant hematoma that then opened. There is no reason to suspect at the moment that she needs secondary prevention [name], [name] ([id number]) Electronic Signature(s) Signed: 05/26/2017 7:49:24 AM By: Baltazar Najjar MD Entered By: Baltazar Najjar on 05/25/2017 15:49:10 [name], [name] ([id number]) -------------------------------------------------------------------------------- SuperBill Details Patient Name: [name] Date of Service: 05/25/2017 Medical Record Patient Account Number: [id number] Number: Treating RN: 10-08-Mccullough (83 y.o. Other Clinician: Date of Birth/Sex: Female) Treating Laelle Bridgett Primary Care Provider: SYSTEM, Mccullough Provider/Extender: [name] Referring Provider: Myrtie Mccullough in Treatment: 13 Diagnosis Coding ICD-10 Codes Code Description S81.811D Laceration without foreign body, right lower leg, subsequent encounter I87.321 Chronic venous hypertension (idiopathic) with inflammation of right lower extremity Facility  Procedures CPT4 Code: [id number] Description: 30865 - WOUND CARE VISIT-LEV 2 EST PT Modifier: Quantity: 1 Physician Procedures CPT4: Description Modifier Quantity Code [id number] 99213 - WC PHYS LEVEL 3 - EST PT 1 ICD-10 Description Diagnosis S81.811D Laceration without foreign body, right lower leg, subsequent encounter I87.321 Chronic venous hypertension (idiopathic) with  inflammation of right lower extremity Electronic Signature(s) Signed: 05/25/2017 5:11:06 PM By: Elliot Gurney, BSN, RN, CWS, Kim RN, BSN Signed: 05/26/2017 7:49:24 AM By: Baltazar Najjar MD Entered By: Elliot Gurney, BSN, RN, CWS, Kim on 05/25/2017 17:09:26

## 2017-06-02 NOTE — Progress Notes (Signed)
[name], [name] ([id number]) Visit Report for 05/04/2017 Arrival Information Details Patient Name: [name], [name] Date of Service: 05/04/2017 2:00 PM Medical Record Number: [id number] Patient Account Number: [id number] Date of Birth/Sex: May 10, 1929 (83 y.o. Female) Treating RN: [name], [name] Primary Care [name]: [name], PCP Other Clinician: Referring [name]: [name] Treating [name]/Extender: [name], [name] in Treatment: 10 Visit Information History Since Last Visit All ordered tests and consults were completed: No Patient Arrived: Ambulatory Added or deleted any medications: No Arrival Time: 14:01 Any [name] allergies or adverse reactions: No Accompanied By: daughter Had a fall or experienced change in No Transfer Assistance: None activities of daily living that may affect Patient Identification Verified: Yes risk of falls: Secondary Verification Process Yes Signs or symptoms of abuse/neglect since last No Completed: visito Patient Requires Transmission-Based No Hospitalized since last visit: No Precautions: Has Dressing in Place as Prescribed: Yes Patient Has Alerts: No Has Compression in Place as Prescribed: Yes Pain Present Now: No Electronic Signature(s) Signed: 05/11/2017 5:50:39 PM By: [name], BSN, RN, CWS, Kim RN, Mccullough Previous Signature: 05/04/2017 5:39:54 PM Version By: [name] Entered By: [name], BSN, RN, CWS, [name] on 05/11/2017 17:47:18 [name], [name] ([id number]) -------------------------------------------------------------------------------- Encounter Discharge Information Details Patient Name: [name] Date of Service: 05/04/2017 2:00 PM Medical Record Number: [id number] Patient Account Number: [id number] Date of Birth/Sex: [birth year] (83 y.o. Female) Treating RN: [name], [name] Primary Care Yakub Lodes: [name], PCP Other Clinician: Referring Shontelle Muska: [name] Treating Kenetra Hildenbrand/Extender: [name], [name] in Treatment: 10 Encounter  Discharge Information Items Discharge Pain Level: 0 Discharge Condition: [name] Ambulatory Status: Ambulatory Discharge Destination: Home Transportation: Private Auto Accompanied By: daughter Schedule Follow-up Appointment: Yes Medication Reconciliation completed and provided to Patient/Care No [name]: Provided on Clinical Summary of Care: 05/04/2017 Form Type Recipient Paper Patient [name] Electronic Signature(s) Signed: 05/11/2017 5:50:39 PM By: [name], BSN, RN, CWS, Kim RN, Mccullough Previous Signature: 05/04/2017 2:33:39 PM Version By: Gwenlyn Perking Entered By: [name] BSN, RN, CWS, [name] on 05/11/2017 17:49:52 [name], [name] ([id number]) -------------------------------------------------------------------------------- Lower Extremity Assessment Details Patient Name: [name] Date of Service: 05/04/2017 2:00 PM Medical Record Number: [id number] Patient Account Number: [id number] Date of Birth/Sex: 05/29/1929 (83 y.o. Female) Treating RN: [name], [name] Primary Care Murl Zogg: [name], PCP Other Clinician: Referring Madisin Hasan: [name] Treating Annalisse Minkoff/Extender: [name], [name] in Treatment: 10 Edema Assessment Assessed: [Left: No] [Right: No] E[Left: dema] [Right: :] Calf Left: Right: Point of Measurement: 28 cm From Medial Instep cm cm Ankle Left: Right: Point of Measurement: 8 cm From Medial Instep cm cm Vascular Assessment Pulses: Dorsalis Pedis Palpable: [Left:Yes] Posterior Tibial Extremity colors, hair growth, and conditions: Extremity Color: [Left:Normal] Temperature of Extremity: [Left:Warm] Capillary Refill: [Left:< 3 seconds] Toe Nail Assessment Left: Right: Thick: No Discolored: No Deformed: No Improper Length and Hygiene: No Electronic Signature(s) Signed: 05/11/2017 5:50:39 PM By: [name], BSN, RN, CWS, Kim RN, Mccullough Signed: 06/01/2017 5:29:25 PM By: [name] Previous Signature: 05/04/2017 5:39:54 PM Version By: [name] Entered By: [name]  BSN, RN, CWS, [name] on 05/11/2017 17:47:42 [name], [name] ([id number]) -------------------------------------------------------------------------------- Multi Wound Chart Details Patient Name: [name] Date of Service: 05/04/2017 2:00 PM Medical Record Number: [id number] Patient Account Number: [id number] Date of Birth/Sex: Oct 23, 1928 (83 y.o. Female) Treating RN: [name], [name] Primary Care Damyan Corne: [name], PCP Other Clinician: Referring [name]: [name] Treating Daray Polgar/Extender: [name], [name] in Treatment: 10 Vital Signs Height(in): 64 Pulse(bpm): 75 Weight(lbs): 138 Blood Pressure 142/86 (mmHg): Body Mass Index(BMI): 24 Temperature(F): 97.5 Respiratory Rate 16 (breaths/min): Photos: [N/A:N/A] Wound Location:  Right Lower Leg - Anterior N/A N/A Wounding Event: Trauma N/A N/A Primary Etiology: Trauma, Other N/A N/A Comorbid History: Cataracts, Arrhythmia, N/A N/A Congestive Heart Failure, Hypertension, Osteoarthritis Date Acquired: 01/28/2017 N/A N/A Mccullough of Treatment: 10 N/A N/A Wound Status: Open N/A N/A Measurements L x W x D 0.1x0.3x0.3 N/A N/A (cm) Area (cm) : 0.024 N/A N/A Volume (cm) : 0.007 N/A N/A % Reduction in Area: 99.80% N/A N/A % Reduction in Volume: 100.00% N/A N/A Classification: Full Thickness With N/A N/A Exposed Support Structures Exudate Amount: Medium N/A N/A Exudate Type: Serous N/A N/A Exudate Color: amber N/A N/A Wound Margin: Epibole N/A N/A [name] ([id number]) Granulation Amount: Large (67-100%) N/A N/A Granulation Quality: Red, Hyper-granulation N/A N/A Necrotic Amount: Small (1-33%) N/A N/A Exposed Structures: Fat Layer (Subcutaneous N/A N/A Tissue) Exposed: Yes Fascia: No Tendon: No Muscle: No Joint: No Bone: No Epithelialization: Large (67-100%) N/A N/A Periwound Skin Texture: Excoriation: No N/A N/A Induration: No Callus: No Crepitus: No Rash: No Scarring: No Periwound Skin Maceration: No N/A  N/A Moisture: Dry/Scaly: No Periwound Skin Color: Hemosiderin Staining: Yes N/A N/A Mottled: Yes Atrophie Blanche: No Cyanosis: No Ecchymosis: No Erythema: No Pallor: No Rubor: No Temperature: No Abnormality N/A N/A Tenderness on Yes N/A N/A Palpation: Wound Preparation: Ulcer Cleansing: N/A N/A Rinsed/Irrigated with Saline, Other: soap and water Topical Anesthetic Applied: None, Other: lidocaine 4% Treatment Notes Wound #1 (Right, Anterior Lower Leg) 1. Cleansed with: Clean wound with Normal Saline Cleanse wound with antibacterial soap and water 2. Anesthetic Topical Lidocaine 4% cream to wound bed prior to debridement 4. Dressing Applied: Prisma Ag 5. Secondary Dressing Applied Seguin, Khalani ([id number]) ABD Pad 7. Secured with Paper tape 3 Layer Compression [name] - Right Lower Extremity Notes unna to anchor Electronic Signature(s) Signed: 05/11/2017 5:50:39 PM By: [name] GurneyWoody, BSN, RN, CWS, Kim RN, Mccullough Previous Signature: 05/04/2017 5:39:54 PM Version By: [name] MullingPinkerton, Debra Entered By: [name] GurneyWoody, BSN, RN, CWS, [name] on 05/11/2017 17:48:04 [name], [name] ([id number]) -------------------------------------------------------------------------------- Multi-Disciplinary Care Plan Details Patient Name: [name], Kawanna Date of Service: 05/04/2017 2:00 PM Medical Record Number: [id number] Patient Account Number: [id number] Date of Birth/Sex: 05/14/1929 (83 y.o. Female) Treating RN: [name] CordiaPinkerton, [name] Primary Care Yianna Tersigni: [name], PCP Other Clinician: Referring Sharif Rendell: [name] CabalJONES, MAURICE Treating Jisella Ashenfelter/Extender: [name] DibblesSTONE Mccullough, [name] in Treatment: 10 Active Inactive ` Abuse / Safety / Falls / Self Care Management Nursing Diagnoses: History of Falls Impaired home maintenance Impaired physical mobility Potential for falls Potential for injury related to transfers Goals: Patient will not develop complications from immobility Date Initiated: 02/23/2017 Target Resolution Date:  07/26/2017 Goal Status: Active Patient will not experience any injury related to falls Date Initiated: 02/23/2017 Target Resolution Date: 07/26/2017 Goal Status: Active Patient will remain injury free related to falls Date Initiated: 02/23/2017 Target Resolution Date: 07/26/2017 Goal Status: Active Patient/caregiver will demonstrate safe use of adaptive devices to increase mobility Date Initiated: 02/23/2017 Target Resolution Date: 07/26/2017 Goal Status: Active Patient/caregiver will identify factors that restrict self-care and home management Date Initiated: 02/23/2017 Target Resolution Date: 07/26/2017 Goal Status: Active Patient/caregiver will verbalize understanding of skin care regimen Date Initiated: 02/23/2017 Target Resolution Date: 07/26/2017 Goal Status: Active Patient/caregiver will verbalize understanding of the importance to maintain current immunizations/vaccinations Date Initiated: 02/23/2017 Target Resolution Date: 07/26/2017 Goal Status: Active Patient/caregiver will verbalize/demonstrate measure taken to improve self care Date Initiated: 02/23/2017 Target Resolution Date: 07/26/2017 [name], [name] ([id number]) Goal Status: Active Patient/caregiver will verbalize/demonstrate measures taken to improve the patient's personal safety Date Initiated: 02/23/2017 Target Resolution Date:  07/26/2017 Goal Status: Active Patient/caregiver will verbalize/demonstrate measures taken to prevent injury and/or falls Date Initiated: 02/23/2017 Target Resolution Date: 07/26/2017 Goal Status: Active Patient/caregiver will verbalize/demonstrate understanding of what to do in case of emergency Date Initiated: 02/23/2017 Target Resolution Date: 07/26/2017 Goal Status: Active Interventions: Call light and/or bell within patient's reach Assess Activities of Daily Living upon admission and as needed Assess fall risk on admission and as needed Assess: immobility, friction, shearing,  incontinence upon admission and as needed Assess impairment of mobility on admission and as needed per policy Assess personal safety and home safety (as indicated) on admission and as needed Assess self care needs on admission and as needed Provide education on basic hygiene Provide education on fall prevention Provide education on personal and home safety Provide education on safe transfers Treatment Activities: Education provided on Basic Hygiene : 04/20/2017 Notes: ` Orientation to the Wound Care Program Nursing Diagnoses: Knowledge deficit related to the wound healing center program Goals: Patient/caregiver will verbalize understanding of the Wound Healing Center Program Date Initiated: 02/23/2017 Target Resolution Date: 07/26/2017 Goal Status: Active Interventions: Provide education on orientation to the wound center Notes: ` [name], [name] ([id number]) Wound/Skin Impairment Nursing Diagnoses: Impaired tissue integrity Knowledge deficit related to ulceration/compromised skin integrity Goals: Patient/caregiver will verbalize understanding of skin care regimen Date Initiated: 02/23/2017 Target Resolution Date: 07/26/2017 Goal Status: Active Ulcer/skin breakdown will have a volume reduction of 30% by week 4 Date Initiated: 02/23/2017 Target Resolution Date: 07/26/2017 Goal Status: Active Ulcer/skin breakdown will have a volume reduction of 50% by week 8 Date Initiated: 02/23/2017 Target Resolution Date: 07/26/2017 Goal Status: Active Ulcer/skin breakdown will have a volume reduction of 80% by week 12 Date Initiated: 02/23/2017 Target Resolution Date: 07/26/2017 Goal Status: Active Ulcer/skin breakdown will heal within 14 Mccullough Date Initiated: 02/23/2017 Target Resolution Date: 07/26/2017 Goal Status: Active Interventions: Assess patient/caregiver ability to obtain necessary supplies Assess patient/caregiver ability to perform ulcer/skin care regimen upon admission and as  needed Assess ulceration(s) every visit Provide education on ulcer and skin care Treatment Activities: Referred to DME Solei Wubben for dressing supplies : 02/23/2017 Skin care regimen initiated : 02/23/2017 Topical wound management initiated : 02/23/2017 Notes: Electronic Signature(s) Signed: 05/11/2017 5:50:39 PM By: [name], BSN, RN, CWS, Kim RN, Mccullough Signed: 06/01/2017 5:29:25 PM By: [name] Previous Signature: 05/04/2017 5:39:54 PM Version By: [name] Entered By: [name] BSN, RN, CWS, [name] on 05/11/2017 17:47:50 [name], [name] ([id number]) -------------------------------------------------------------------------------- Pain Assessment Details Patient Name: [name] Date of Service: 05/04/2017 2:00 PM Medical Record Number: [id number] Patient Account Number: [id number] Date of Birth/Sex: May 19, 1929 (83 y.o. Female) Treating RN: [name], [name] Primary Care Tarl Cephas: [name], PCP Other Clinician: Referring Ivori Storr: [name] Treating Taejon Irani/Extender: [name], [name] in Treatment: 10 Active Problems Location of Pain Severity and Description of Pain Patient Has Paino No Site Locations With Dressing Change: No Pain Management and Medication Current Pain Management: Electronic Signature(s) Signed: 05/11/2017 5:50:39 PM By: [name], BSN, RN, CWS, Kim RN, Mccullough Signed: 06/01/2017 5:29:25 PM By: [name] Previous Signature: 05/04/2017 5:39:54 PM Version By: [name] Entered By: [name] BSN, RN, CWS, [name] on 05/11/2017 17:47:24 [name], [name] ([id number]) -------------------------------------------------------------------------------- Patient/Caregiver Education Details Patient Name: [name] Date of Service: 05/04/2017 2:00 PM Medical Record Number: [id number] Patient Account Number: [id number] Date of Birth/Gender: 1929/03/13 (83 y.o. Female) Treating RN: [name], [name] Primary Care Physician: [name], PCP Other Clinician: Referring Physician: Altamese Mccullough Treating Physician/Extender: Skeet Simmer in Treatment: 10 Education Assessment Education Provided To: Patient  Education Topics Provided Wound/Skin Impairment: Handouts: Other: do not get wrap wet Methods: Demonstration, Explain/Verbal Responses: State content correctly Electronic Signature(s) Signed: 05/11/2017 5:50:39 PM By: [name], BSN, RN, CWS, Kim RN, Mccullough Previous Signature: 05/04/2017 5:39:54 PM Version By: [name] Entered By: [name] BSN, RN, CWS, [name] on 05/11/2017 17:49:58 [name], [name] ([id number]) -------------------------------------------------------------------------------- Wound Assessment Details Patient Name: [name] Date of Service: 05/04/2017 2:00 PM Medical Record Number: [id number] Patient Account Number: [id number] Date of Birth/Sex: June 02, 1929 (83 y.o. Female) Treating RN: [name], [name] Primary Care Natausha Jungwirth: [name], PCP Other Clinician: Referring Tahje Borawski: [name] Treating Macarius Ruark/Extender: Maxwell Caul Mccullough in Treatment: 10 Wound Status Wound Number: 1 Primary Trauma, Other Etiology: Wound Location: Right Lower Leg - Anterior Wound Open Wounding Event: Trauma Status: Date Acquired: 01/28/2017 Comorbid Cataracts, Arrhythmia, Congestive Mccullough Of Treatment: 10 History: Heart Failure, Hypertension, Clustered Wound: No Osteoarthritis Photos Photo Uploaded By: [name] on 05/04/2017 17:26:17 Wound Measurements Length: (cm) 0.1 Width: (cm) 0.3 Depth: (cm) 0.3 Area: (cm) 0.024 Volume: (cm) 0.007 % Reduction in Area: 99.8% % Reduction in Volume: 100% Epithelialization: Large (67-100%) Tunneling: No Undermining: No Wound Description Full Thickness With Exposed Classification: Support Structures Wound Margin: Epibole Exudate Medium Amount: Exudate Type: Serous Exudate Color: amber Foul Odor After Cleansing: No Slough/Fibrino Yes Wound Bed Granulation Amount: Large (67-100%) Exposed  Structure Granulation Quality: Red, Hyper-granulation Fascia Exposed: No [name], [name] ([id number]) Necrotic Amount: Small (1-33%) Fat Layer (Subcutaneous Tissue) Exposed: Yes Necrotic Quality: Adherent Slough Tendon Exposed: No Muscle Exposed: No Joint Exposed: No Bone Exposed: No Periwound Skin Texture Texture Color No Abnormalities Noted: No No Abnormalities Noted: No Callus: No Atrophie Blanche: No Crepitus: No Cyanosis: No Excoriation: No Ecchymosis: No Induration: No Erythema: No Rash: No Hemosiderin Staining: Yes Scarring: No Mottled: Yes Pallor: No Moisture Rubor: No No Abnormalities Noted: No Dry / Scaly: No Temperature / Pain Maceration: No Temperature: No Abnormality Tenderness on Palpation: Yes Wound Preparation Ulcer Cleansing: Rinsed/Irrigated with Saline, Other: soap and water, Topical Anesthetic Applied: None, Other: lidocaine 4%, Electronic Signature(s) Signed: 05/04/2017 5:39:54 PM By: [name] Entered By: [name] on 05/04/2017 14:11:01 [name], [name] ([id number]) -------------------------------------------------------------------------------- Vitals Details Patient Name: [name] Date of Service: 05/04/2017 2:00 PM Medical Record Number: [id number] Patient Account Number: [id number] Date of Birth/Sex: 12-27-1928 (83 y.o. Female) Treating RN: [name], [name] Primary Care Jaline Pincock: [name], PCP Other Clinician: Referring Coner Gibbard: [name] Treating Krystina Strieter/Extender: [name], [name] in Treatment: 10 Vital Signs Time Taken: 14:02 Temperature (F): 97.5 Height (in): 64 Pulse (bpm): 75 Weight (lbs): 138 Respiratory Rate (breaths/min): 16 Body Mass Index (BMI): 23.7 Blood Pressure (mmHg): 142/86 Reference Range: 80 - 120 mg / dl Electronic Signature(s) Signed: 05/11/2017 5:50:39 PM By: [name], BSN, RN, CWS, Kim RN, Mccullough Previous Signature: 05/04/2017 5:39:54 PM Version By: [name] Entered By: [name], BSN, RN,  CWS, [name] on 05/11/2017 17:47:31

## 2017-08-02 ENCOUNTER — Telehealth: Payer: Self-pay

## 2017-08-02 NOTE — Telephone Encounter (Signed)
I don't know without seeing it. She can try OTC clotrimazole cream to see if it's fungal. Use BID for 1-2 wks. F/u if still sx.

## 2017-08-02 NOTE — Telephone Encounter (Signed)
Pt c/o redness and irritation on labia and upper leg.  What to use?  [phone]

## 2017-08-02 NOTE — Telephone Encounter (Signed)
Pt aware.

## 2018-05-10 ENCOUNTER — Other Ambulatory Visit: Payer: Self-pay | Admitting: Cardiology

## 2018-05-11 ENCOUNTER — Other Ambulatory Visit: Payer: Self-pay | Admitting: Cardiology

## 2018-05-11 DIAGNOSIS — R0602 Shortness of breath: Secondary | ICD-10-CM

## 2018-05-11 DIAGNOSIS — R600 Localized edema: Secondary | ICD-10-CM

## 2018-05-17 ENCOUNTER — Ambulatory Visit
Admission: RE | Admit: 2018-05-17 | Discharge: 2018-05-17 | Disposition: A | Discharge status: Home / Self Care | Payer: Medicare Other | Source: Ambulatory Visit | Attending: Cardiology | Admitting: Cardiology

## 2018-05-17 DIAGNOSIS — M7122 Synovial cyst of popliteal space [Baker], left knee: Secondary | ICD-10-CM | POA: Insufficient documentation

## 2018-05-17 DIAGNOSIS — R600 Localized edema: Secondary | ICD-10-CM

## 2019-03-28 ENCOUNTER — Emergency Department: Payer: Medicare Other

## 2019-03-28 ENCOUNTER — Inpatient Hospital Stay
Admission: EM | Admit: 2019-03-28 | Discharge: 2019-03-30 | DRG: 640 | Disposition: A | Discharge status: Home / Self Care | Payer: Medicare Other | Attending: Internal Medicine | Admitting: Internal Medicine

## 2019-03-28 ENCOUNTER — Encounter: Payer: Self-pay | Admitting: Emergency Medicine

## 2019-03-28 ENCOUNTER — Other Ambulatory Visit: Payer: Self-pay

## 2019-03-28 ENCOUNTER — Inpatient Hospital Stay
Admit: 2019-03-28 | Discharge: 2019-03-28 | Disposition: A | Discharge status: Home / Self Care | Payer: Medicare Other | Attending: Internal Medicine | Admitting: Internal Medicine

## 2019-03-28 DIAGNOSIS — I5033 Acute on chronic diastolic (congestive) heart failure: Secondary | ICD-10-CM | POA: Diagnosis present

## 2019-03-28 DIAGNOSIS — Z950 Presence of cardiac pacemaker: Secondary | ICD-10-CM

## 2019-03-28 DIAGNOSIS — Z880 Allergy status to penicillin: Secondary | ICD-10-CM | POA: Diagnosis not present

## 2019-03-28 DIAGNOSIS — Z952 Presence of prosthetic heart valve: Secondary | ICD-10-CM | POA: Diagnosis not present

## 2019-03-28 DIAGNOSIS — R531 Weakness: Secondary | ICD-10-CM

## 2019-03-28 DIAGNOSIS — I130 Hypertensive heart and chronic kidney disease with heart failure and stage 1 through stage 4 chronic kidney disease, or unspecified chronic kidney disease: Secondary | ICD-10-CM | POA: Diagnosis present

## 2019-03-28 DIAGNOSIS — E871 Hypo-osmolality and hyponatremia: Secondary | ICD-10-CM | POA: Diagnosis present

## 2019-03-28 DIAGNOSIS — Z886 Allergy status to analgesic agent status: Secondary | ICD-10-CM

## 2019-03-28 DIAGNOSIS — H409 Unspecified glaucoma: Secondary | ICD-10-CM | POA: Diagnosis present

## 2019-03-28 DIAGNOSIS — I4891 Unspecified atrial fibrillation: Secondary | ICD-10-CM | POA: Diagnosis present

## 2019-03-28 DIAGNOSIS — N189 Chronic kidney disease, unspecified: Secondary | ICD-10-CM | POA: Diagnosis present

## 2019-03-28 DIAGNOSIS — Z953 Presence of xenogenic heart valve: Secondary | ICD-10-CM | POA: Diagnosis not present

## 2019-03-28 DIAGNOSIS — R1031 Right lower quadrant pain: Secondary | ICD-10-CM | POA: Diagnosis not present

## 2019-03-28 DIAGNOSIS — Z66 Do not resuscitate: Secondary | ICD-10-CM | POA: Diagnosis present

## 2019-03-28 DIAGNOSIS — Z881 Allergy status to other antibiotic agents status: Secondary | ICD-10-CM | POA: Diagnosis not present

## 2019-03-28 DIAGNOSIS — Z882 Allergy status to sulfonamides status: Secondary | ICD-10-CM | POA: Diagnosis not present

## 2019-03-28 DIAGNOSIS — R1084 Generalized abdominal pain: Secondary | ICD-10-CM

## 2019-03-28 DIAGNOSIS — R0989 Other specified symptoms and signs involving the circulatory and respiratory systems: Secondary | ICD-10-CM

## 2019-03-28 DIAGNOSIS — E785 Hyperlipidemia, unspecified: Secondary | ICD-10-CM | POA: Diagnosis present

## 2019-03-28 DIAGNOSIS — Z888 Allergy status to other drugs, medicaments and biological substances status: Secondary | ICD-10-CM

## 2019-03-28 DIAGNOSIS — Z1159 Encounter for screening for other viral diseases: Secondary | ICD-10-CM

## 2019-03-28 DIAGNOSIS — R109 Unspecified abdominal pain: Secondary | ICD-10-CM

## 2019-03-28 LAB — COMPREHENSIVE METABOLIC PANEL
ALT: 70 U/L — ABNORMAL HIGH (ref 0–44)
AST: 49 U/L — ABNORMAL HIGH (ref 15–41)
Albumin: 4 g/dL (ref 3.5–5.0)
Alkaline Phosphatase: 100 U/L (ref 38–126)
Anion gap: 12 (ref 5–15)
BUN: 22 mg/dL (ref 8–23)
CO2: 18 mmol/L — ABNORMAL LOW (ref 22–32)
Calcium: 9.2 mg/dL (ref 8.9–10.3)
Chloride: 95 mmol/L — ABNORMAL LOW (ref 98–111)
Creatinine, Ser: 1.12 mg/dL — ABNORMAL HIGH (ref 0.44–1.00)
GFR calc Af Amer: 51 mL/min — ABNORMAL LOW (ref >60)
GFR calc non Af Amer: 44 mL/min — ABNORMAL LOW (ref >60)
Glucose, Bld: 142 mg/dL — ABNORMAL HIGH (ref 70–99)
Potassium: 4.3 mmol/L (ref 3.5–5.1)
Sodium: 125 mmol/L — ABNORMAL LOW (ref 135–145)
Total Bilirubin: 1.2 mg/dL (ref 0.3–1.2)
Total Protein: 6.6 g/dL (ref 6.5–8.1)

## 2019-03-28 LAB — CBC
HCT: 37.5 % (ref 36.0–46.0)
Hemoglobin: 12.9 g/dL (ref 12.0–15.0)
MCH: 32.2 pg (ref 26.0–34.0)
MCHC: 34.4 g/dL (ref 30.0–36.0)
MCV: 93.5 fL (ref 80.0–100.0)
Platelets: 196 10*3/uL (ref 150–400)
RBC: 4.01 MIL/uL (ref 3.87–5.11)
RDW: 13.6 % (ref 11.5–15.5)
WBC: 7.6 10*3/uL (ref 4.0–10.5)
nRBC: 0 % (ref 0.0–0.2)

## 2019-03-28 LAB — LIPASE, BLOOD: Lipase: 32 U/L (ref 11–51)

## 2019-03-28 LAB — URINALYSIS, COMPLETE (UACMP) WITH MICROSCOPIC
Bilirubin Urine: NEGATIVE
Glucose, UA: NEGATIVE mg/dL
Hgb urine dipstick: NEGATIVE
Ketones, ur: NEGATIVE mg/dL
Nitrite: NEGATIVE
Protein, ur: 100 mg/dL — AB
Specific Gravity, Urine: 1.012 (ref 1.005–1.030)
pH: 5 (ref 5.0–8.0)

## 2019-03-28 LAB — BRAIN NATRIURETIC PEPTIDE: B Natriuretic Peptide: 1871 pg/mL — ABNORMAL HIGH (ref 0.0–100.0)

## 2019-03-28 LAB — TSH: TSH: 5.46 u[IU]/mL — ABNORMAL HIGH (ref 0.350–4.500)

## 2019-03-28 MED ORDER — SODIUM CHLORIDE 0.9 % IV SOLN
1.0000 g | INTRAVENOUS | Status: DC
  Administered 2019-03-28: 15:00:00 1 g via INTRAVENOUS
  Filled 2019-03-28 (×2): qty 10

## 2019-03-28 MED ORDER — AMLODIPINE BESYLATE 5 MG PO TABS
2.5000 mg | ORAL_TABLET | Freq: Every day | ORAL | Status: DC
  Administered 2019-03-28: 2.5 mg via ORAL
  Filled 2019-03-28: qty 1

## 2019-03-28 MED ORDER — IOHEXOL 300 MG/ML  SOLN
75.0000 mL | Freq: Once | INTRAMUSCULAR | Status: AC | PRN
  Administered 2019-03-28: 11:00:00 75 mL via INTRAVENOUS

## 2019-03-28 MED ORDER — ONDANSETRON HCL 4 MG/2ML IJ SOLN
4.0000 mg | Freq: Four times a day (QID) | INTRAMUSCULAR | Status: DC | PRN
  Administered 2019-03-28 (×2): 4 mg via INTRAVENOUS
  Filled 2019-03-28 (×2): qty 2

## 2019-03-28 MED ORDER — SODIUM CHLORIDE 0.9 % IV SOLN
INTRAVENOUS | Status: DC
  Administered 2019-03-28: 19:00:00 via INTRAVENOUS

## 2019-03-28 MED ORDER — HYDROCODONE-ACETAMINOPHEN 5-325 MG PO TABS
1.0000 | ORAL_TABLET | ORAL | Status: DC | PRN
  Administered 2019-03-28: 15:00:00 1 via ORAL
  Filled 2019-03-28 (×2): qty 1

## 2019-03-28 MED ORDER — DOCUSATE SODIUM 100 MG PO CAPS
100.0000 mg | ORAL_CAPSULE | Freq: Every day | ORAL | Status: DC
  Administered 2019-03-28 – 2019-03-29 (×2): 100 mg via ORAL
  Filled 2019-03-28 (×2): qty 1

## 2019-03-28 MED ORDER — ENOXAPARIN SODIUM 40 MG/0.4ML ~~LOC~~ SOLN
40.0000 mg | SUBCUTANEOUS | Status: DC
  Administered 2019-03-28 – 2019-03-29 (×2): 40 mg via SUBCUTANEOUS
  Filled 2019-03-28 (×2): qty 0.4

## 2019-03-28 MED ORDER — ONDANSETRON HCL 4 MG/2ML IJ SOLN
4.0000 mg | Freq: Once | INTRAMUSCULAR | Status: AC
  Administered 2019-03-28: 10:00:00 4 mg via INTRAVENOUS
  Filled 2019-03-28: qty 2

## 2019-03-28 MED ORDER — ADULT MULTIVITAMIN W/MINERALS CH
1.0000 | ORAL_TABLET | Freq: Every day | ORAL | Status: DC
  Administered 2019-03-29 – 2019-03-30 (×2): 1 via ORAL
  Filled 2019-03-28 (×2): qty 1

## 2019-03-28 MED ORDER — ESTRADIOL 0.1 MG/GM VA CREA
1.0000 | TOPICAL_CREAM | Freq: Every day | VAGINAL | Status: DC
  Filled 2019-03-28: qty 42.5

## 2019-03-28 MED ORDER — MORPHINE SULFATE (PF) 2 MG/ML IV SOLN
2.0000 mg | Freq: Once | INTRAVENOUS | Status: AC
  Administered 2019-03-28: 2 mg via INTRAVENOUS
  Filled 2019-03-28: qty 1

## 2019-03-28 MED ORDER — DORZOLAMIDE HCL-TIMOLOL MAL 2-0.5 % OP SOLN
1.0000 [drp] | Freq: Two times a day (BID) | OPHTHALMIC | Status: DC
  Administered 2019-03-28 – 2019-03-30 (×4): 1 [drp] via OPHTHALMIC
  Filled 2019-03-28: qty 10

## 2019-03-28 MED ORDER — POLYETHYLENE GLYCOL 3350 17 G PO PACK: 17.0000 g | PACK | Freq: Every day | ORAL | Status: DC | PRN

## 2019-03-28 MED ORDER — ACETAMINOPHEN 325 MG PO TABS: 650.0000 mg | ORAL_TABLET | Freq: Four times a day (QID) | ORAL | Status: DC | PRN

## 2019-03-28 MED ORDER — BRIMONIDINE TARTRATE 0.15 % OP SOLN
1.0000 [drp] | Freq: Two times a day (BID) | OPHTHALMIC | Status: DC
  Administered 2019-03-28 – 2019-03-30 (×4): 1 [drp] via OPHTHALMIC
  Filled 2019-03-28: qty 5

## 2019-03-28 NOTE — Consult Note (Signed)
[facility] SURGICAL CONSULTATION NOTE (initial) - cpt: [id number]: [id number]    HISTORY OF PRESENT ILLNESS (HPI):  85 y.o. female presented to [facility] ED today for evaluation of abdominal pain. Patient reports right sided abdominal pain and decreased appetite over the last ~1 month. She described the pain as a generalized cramping and bloating sensation which waxes and wanes but has come more severe in last few days. However, at time of interview she reports being pain free. Endorses associated intermittent nausea with the symptoms. No reports of fever, chills, cough, congestion. She is reporting increasing SOB over the last few days. No chest pain. She does have a history of AV replacement and pacemaker placement by Dr Darrold JunkerParaschos in 2017. No history of similar pain or association with eating. No previous abdominal surgeries. Work up in the ED showed hyponatremia, pulmonary vascular congestion on CXR, and gallbladder wall thickening on CT. She was admitted to medicine and HIDa scan was ordered.   Surgery is consulted by hospitalist physician Dr. Juliene PinaMody, MD in this context for evaluation and management of gall bladder wall thickening pending HIDA scan in the setting of ~1 month of diffuse abdominal discomfort.    PAST MEDICAL HISTORY (PMH):  Past Medical History:  Diagnosis Date  . Dysrhythmia    A-fib  . H/O aortic valve replacement   . Hyperlipidemia   . Hypertension      PAST SURGICAL HISTORY (PSH):  Past Surgical History:  Procedure Laterality Date  . aortic stenosos    . CATARACT EXTRACTION    . EYE SURGERY    . GLAUCOMA SURGERY    . PACEMAKER INSERTION Right   . PACEMAKER INSERTION Right 10/16/2015   Procedure: ICD GENERATOR CHANGE;  Surgeon: Marcina MillardAlexander Paraschos, MD;  Location: [facility] ORS;  Service: Cardiovascular;  Laterality: Right;     MEDICATIONS:  Prior to Admission medications   Medication Sig Start Date End Date Taking? Authorizing Provider  acetaminophen (TYLENOL) 325 MG  tablet Take 650 mg by mouth every 6 (six) hours as needed.   Yes [provider]  amLODipine (NORVASC) 2.5 MG tablet Take 2.5 mg by mouth daily.    Yes [provider]  brimonidine (ALPHAGAN P) 0.15 % ophthalmic solution Place 1 drop into the right eye 2 (two) times a day. 02/15/19  Yes [provider]  docusate sodium (COLACE) 100 MG capsule Take 100 mg by mouth daily.   Yes [provider]  dorzolamide-timolol (COSOPT) 22.3-6.8 MG/ML ophthalmic solution Place 1 drop into the right eye 2 (two) times a day. 03/01/19  Yes [provider]  estradiol (ESTRACE) 0.1 MG/GM vaginal cream Place 1 Applicatorful vaginally at bedtime.   Yes [provider]  Multiple Vitamin (MULTIVITAMIN WITH MINERALS) TABS tablet Take 1 tablet by mouth daily.   Yes [provider]  VITAMIN D, CHOLECALCIFEROL, PO Take 1 tablet by mouth daily.   Yes [provider]     ALLERGIES:  Allergies  Allergen Reactions  . Lisinopril Shortness Of Breath and Cough  . Sulfur Nausea And Vomiting  . Amoxicillin Nausea And Vomiting  . Betoptic [Betaxolol] Other (See Comments)    headache  . Biaxin [Clarithromycin] Nausea And Vomiting  . Cephalexin Nausea And Vomiting  . Furosemide Other (See Comments)    Thirsty dry mouth  . Gabapentin Other (See Comments)    "spacey" passed out feeling blurry vision  . Hydrochlorothiazide Other (See Comments)    Lightheaded, thirsty, stomach pain, diarrhea  . Mydriacyl [Tropicamide] Other (See  Comments)    Eyes remained dilatated for long time  . Penicillins Nausea And Vomiting  . Naproxen Rash     SOCIAL HISTORY:  Social History   Socioeconomic History  . Marital status: Widowed    Spouse name: Not on file  . Number of children: Not on file  . Years of education: Not on file  . Highest education level: Not on file  Occupational History  . Not on file  Social Needs  . Financial resource strain: Not on file  .  Food insecurity    Worry: Not on file    Inability: Not on file  . Transportation needs    Medical: Not on file    Non-medical: Not on file  Tobacco Use  . Smoking status: Never Smoker  . Smokeless tobacco: Never Used  Substance and Sexual Activity  . Alcohol use: Yes    Comment: wine occ  . Drug use: No  . Sexual activity: Not on file  Lifestyle  . Physical activity    Days per week: Not on file    Minutes per session: Not on file  . Stress: Not on file  Relationships  . Social Musicianconnections    Talks on phone: Not on file    Gets together: Not on file    Attends religious service: Not on file    Active member of club or organization: Not on file    Attends meetings of clubs or organizations: Not on file    Relationship status: Not on file  . Intimate partner violence    Fear of current or ex partner: Not on file    Emotionally abused: Not on file    Physically abused: Not on file    Forced sexual activity: Not on file  Other Topics Concern  . Not on file  Social History Narrative  . Not on file     FAMILY HISTORY:  No family history on file.    REVIEW OF SYSTEMS:  Review of Systems  Constitutional: Negative for chills and fever.       + Decreased Appetite  HENT: Negative for congestion and sore throat.   Respiratory: Positive for shortness of breath. Negative for cough.   Cardiovascular: Negative for chest pain and palpitations.  Gastrointestinal: Positive for abdominal pain and nausea. Negative for constipation, diarrhea and vomiting.  Genitourinary: Negative for dysuria and urgency.  Neurological: Negative for dizziness and headaches.  All other systems reviewed and are negative.   VITAL SIGNS:  Temp:  [98 F (36.7 C)] 98 F (36.7 C) (06/30 0914) Pulse Rate:  [67-81] 69 (06/30 1525) Resp:  [14-24] 14 (06/30 1525) BP: (116-195)/(37-93) 169/38 (06/30 1525) SpO2:  [97 %-100 %] 98 % (06/30 1525)             INTAKE/OUTPUT:  No intake/output data  recorded.  PHYSICAL EXAM:  Physical Exam Vitals signs and nursing note reviewed.  Constitutional:      General: She is not in acute distress.    Appearance: She is well-developed. She is obese. She is not ill-appearing.  HENT:     Head: Atraumatic.  Eyes:     General: No scleral icterus.    Extraocular Movements: Extraocular movements intact.  Cardiovascular:     Rate and Rhythm: Normal rate and regular rhythm.  Pulmonary:     Effort: Pulmonary effort is normal. No respiratory distress.     Breath sounds: Normal breath sounds. No wheezing.  Abdominal:     General:  There is no distension.     Palpations: Abdomen is soft.     Tenderness: There is no abdominal tenderness. There is no guarding or rebound. Negative signs include Murphy's sign.  Genitourinary:    Comments: Deferred Skin:    General: Skin is warm and dry.     Coloration: Skin is not jaundiced or pale.  Neurological:     General: No focal deficit present.     Mental Status: She is alert and oriented to person, place, and time.  Psychiatric:        Mood and Affect: Mood normal.        Behavior: Behavior normal.      Labs:  CBC Latest Ref Rng & Units 03/28/2019 01/28/2017 10/16/2015  WBC 4.0 - 10.5 K/uL 7.6 12.6(H) -  Hemoglobin 12.0 - 15.0 g/dL 12.9 13.1 13.6  Hematocrit 36.0 - 46.0 % 37.5 39.3 40.0  Platelets 150 - 400 K/uL 196 292 -   CMP Latest Ref Rng & Units 03/28/2019 01/28/2017 10/16/2015  Glucose 70 - 99 mg/dL 142(H) 116(H) 112(H)  BUN 8 - 23 mg/dL 22 20 -  Creatinine 0.44 - 1.00 mg/dL 1.12(H) 1.21(H) -  Sodium 135 - 145 mmol/L 125(L) 137 139  Potassium 3.5 - 5.1 mmol/L 4.3 3.9 4.0  Chloride 98 - 111 mmol/L 95(L) 100(L) -  CO2 22 - 32 mmol/L 18(L) 27 -  Calcium 8.9 - 10.3 mg/dL 9.2 9.9 -  Total Protein 6.5 - 8.1 g/dL 6.6 - -  Total Bilirubin 0.3 - 1.2 mg/dL 1.2 - -  Alkaline Phos 38 - 126 U/L 100 - -  AST 15 - 41 U/L 49(H) - -  ALT 0 - 44 U/L 70(H) - -     Imaging studies:   RUQ [place] (03/28/2019)  personally reviewed and radiologist report reviewed:  IMPRESSION: 1) Gallbladder wall thickening with pericholecystic fluid. These changes may be related to the underlying mild ascites although the possibility of cholecystitis could not be totally excluded. HIDA scan may be helpful for clarification. 2) Mild nodularity of the liver and changes suggestive of very early portal hypertension.  CT Abdomen/Pelvis (03/28/2019) personally reviewed and radiologist report reviewed:  IMPRESSION: 1) Mild gallbladder wall thickening or pericholecystic fluid is noted, but no definite cholelithiasis is noted. Ultrasound is recommended for further evaluation. 2) Sigmoid diverticulosis without inflammation. 3) Small amount of free fluid is noted in the pelvis. 4) Aortic Atherosclerosis (ICD10-I70.0).  HIDA (03/28/2019) pending   Assessment/Plan: (ICD-10's: R10.31) 85 y.o. female with a 1 month history of right sided abdominal discomfort, bloating, and decreased appetite, who is currently pain free, but was found to have gall bladder wall thickening on imaging today pending further evaluation with HIDA scan, which is further complicated by pertinent comorbidities including a history of Atrial Fibrillation s/p pacemaker placement in 2017, AV replacement, HLD, HTN, and advanced age.   - Okay for diet as tolerates  - pain control prn; antiemetics prn  - monitor abdominal examination  - Will follow up HIDA results  - Discussed potential management options at bedside including surgery -vs- cholecystostomy tube -vs- dietary modifications; at this time, the patient is not interested in the possibility of surgery  - Further management per primary and consulting services.    All of the above findings and recommendations were discussed with the patient, and all of patient's questions were answered to her expressed satisfaction.  Thank you for the opportunity to participate in this patient's care.    -- Edison Simon,  PA-C [facility] 03/28/2019, 3:52 PM [phone] M-F: 7am - 4pm

## 2019-03-28 NOTE — ED Notes (Signed)
ED TO INPATIENT HANDOFF REPORT  ED Nurse Name and Phone #:  Elmo Putt #[phone]  S Name/Age/Gender [name] 85 y.o. female Room/Bed: ED04A/ED04A  Code Status   Code Status: DNR  Home/SNF/Other Home Patient oriented to: self, place, time and situation Is this baseline? Yes   Triage Complete: Triage complete  Chief Complaint abd pain  Triage Note Daughter reports pt has been having "digestion issues" for about 5-6 weeks, increased constipation and abdominal pain. Daughter reports in the past week, pain has increased across the abdomen, pt has been urinating and having BMs w/out diarrhea, but has decreased appetite and nausea and vomiting. Increased belching reported by daughter. Some relief with tylenol and gas x. Daughter reports lower BP, has had medication changed by Dr Ubaldo Glassing last week.    Allergies Allergies  Allergen Reactions  . Lisinopril Shortness Of Breath and Cough  . Sulfur Nausea And Vomiting  . Amoxicillin Nausea And Vomiting  . Betoptic [Betaxolol] Other (See Comments)    headache  . Biaxin [Clarithromycin] Nausea And Vomiting  . Cephalexin Nausea And Vomiting  . Furosemide Other (See Comments)    Thirsty dry mouth  . Gabapentin Other (See Comments)    "spacey" passed out feeling blurry vision  . Hydrochlorothiazide Other (See Comments)    Lightheaded, thirsty, stomach pain, diarrhea  . Mydriacyl [Tropicamide] Other (See Comments)    Eyes remained dilatated for long time  . Penicillins Nausea And Vomiting  . Naproxen Rash    Level of Care/Admitting Diagnosis ED Disposition    ED Disposition Condition [facility] Area: [place] [[id number]]  Level of Care: Med-Surg [16]  Covid Evaluation: N/A  Diagnosis: Hyponatremia [[id number]]  Admitting Physician: Bettey Costa [[id number]]  Attending Physician: MODY, Ulice Bold [[id number]]  Estimated length of stay: past midnight tomorrow  Certification:: I certify this patient will need inpatient  services for at least 2 midnights  PT Class (Do Not Modify): Inpatient [101]  PT Acc Code (Do Not Modify): Private [1]       B Medical/Surgery History Past Medical History:  Diagnosis Date  . Dysrhythmia    A-fib  . H/O aortic valve replacement   . Hyperlipidemia   . Hypertension    Past Surgical History:  Procedure Laterality Date  . aortic stenosos    . CATARACT EXTRACTION    . EYE SURGERY    . GLAUCOMA SURGERY    . PACEMAKER INSERTION Right   . PACEMAKER INSERTION Right 10/16/2015   Procedure: ICD GENERATOR CHANGE;  Surgeon: Isaias Cowman, MD;  Location: [facility] ORS;  Service: Cardiovascular;  Laterality: Right;     A IV Location/Drains/Wounds Patient Lines/Drains/Airways Status   Active Line/Drains/Airways    Name:   Placement date:   Placement time:   Site:   Days:   Peripheral IV 03/28/19 Left Antecubital   03/28/19    0935    Antecubital   less than 1          Intake/Output Last 24 hours No intake or output data in the 24 hours ending 03/28/19 1412  Labs/Imaging Results for orders placed or performed during the hospital encounter of 03/28/19 (from the past 48 hour(s))  Lipase, blood     Status: None   Collection Time: 03/28/19  9:42 AM  Result Value Ref Range   Lipase 32 11 - 51 U/L    Comment: Performed at [facility], [address], [place], [place] [address]  Comprehensive metabolic panel  Status: Abnormal   Collection Time: 03/28/19  9:42 AM  Result Value Ref Range   Sodium 125 (L) 135 - 145 mmol/L   Potassium 4.3 3.5 - 5.1 mmol/L   Chloride 95 (L) 98 - 111 mmol/L   CO2 18 (L) 22 - 32 mmol/L   Glucose, Bld 142 (H) 70 - 99 mg/dL   BUN 22 8 - 23 mg/dL   Creatinine, Ser 9.141.12 (H) 0.44 - 1.00 mg/dL   Calcium 9.2 8.9 - 78.210.3 mg/dL   Total Protein 6.6 6.5 - 8.1 g/dL   Albumin 4.0 3.5 - 5.0 g/dL   AST 49 (H) 15 - 41 U/L   ALT 70 (H) 0 - 44 U/L   Alkaline Phosphatase 100 38 - 126 U/L   Total Bilirubin 1.2 0.3 - 1.2 mg/dL   GFR calc  non Af Amer 44 (L) >60 mL/min   GFR calc Af Amer 51 (L) >60 mL/min   Anion gap 12 5 - 15    Comment: Performed at [facility], [address] [place], [place] [address]  CBC     Status: None   Collection Time: 03/28/19  9:42 AM  Result Value Ref Range   WBC 7.6 4.0 - 10.5 K/uL   RBC 4.01 3.87 - 5.11 MIL/uL   Hemoglobin 12.9 12.0 - 15.0 g/dL   HCT 30.837.5 65.736.0 - 84.646.0 %   MCV 93.5 80.0 - 100.0 fL   MCH 32.2 26.0 - 34.0 pg   MCHC 34.4 30.0 - 36.0 g/dL   RDW 96.213.6 95.211.5 - 84.115.5 %   Platelets 196 150 - 400 K/uL   nRBC 0.0 0.0 - 0.2 %    Comment: Performed at [facility], [address] [place], [place] [address]  Urinalysis, Complete w Microscopic     Status: Abnormal   Collection Time: 03/28/19  9:42 AM  Result Value Ref Range   Color, Urine YELLOW (A) YELLOW   APPearance HAZY (A) CLEAR   Specific Gravity, Urine 1.012 1.005 - 1.030   pH 5.0 5.0 - 8.0   Glucose, UA NEGATIVE NEGATIVE mg/dL   Hgb urine dipstick NEGATIVE NEGATIVE   Bilirubin Urine NEGATIVE NEGATIVE   Ketones, ur NEGATIVE NEGATIVE mg/dL   Protein, ur 102100 (A) NEGATIVE mg/dL   Nitrite NEGATIVE NEGATIVE   Leukocytes,Ua MODERATE (A) NEGATIVE   RBC / HPF 6-10 0 - 5 RBC/hpf   WBC, UA 0-5 0 - 5 WBC/hpf   Bacteria, UA FEW (A) NONE SEEN   Squamous Epithelial / LPF 6-10 0 - 5   Mucus PRESENT     Comment: Performed at [facility], [address] [place], [place] [address]   Ct Abdomen Pelvis W Contrast  Result Date: 03/28/2019 CLINICAL DATA:  Generalized abdominal pain. EXAM: CT ABDOMEN AND PELVIS WITH CONTRAST TECHNIQUE: Multidetector CT imaging of the abdomen and pelvis was performed using the standard protocol following bolus administration of intravenous contrast. CONTRAST:  75mL OMNIPAQUE IOHEXOL 300 MG/ML  SOLN COMPARISON:  None. FINDINGS: Lower chest: No acute abnormality. Hepatobiliary: No definite cholelithiasis is noted. Mild wall thickening of the gallbladder or pericholecystic fluid is  noted. No biliary dilatation is noted. The liver is unremarkable. Pancreas: Unremarkable. No pancreatic ductal dilatation or surrounding inflammatory changes. Spleen: Normal in size without focal abnormality. Adrenals/Urinary Tract: Adrenal glands are unremarkable. Kidneys are normal, without renal calculi, focal lesion, or hydronephrosis. Bladder is unremarkable. Stomach/Bowel: Stomach is within normal limits. Appendix appears normal. No evidence of bowel wall thickening,  distention, or inflammatory changes. Sigmoid diverticulosis is noted without inflammation. Vascular/Lymphatic: Aortic atherosclerosis. No enlarged abdominal or pelvic lymph nodes. Reproductive: Uterus and bilateral adnexa are unremarkable. Cerclage device is noted. Other: Small amount of free fluid is noted in the pelvis. No definite hernia is noted. Musculoskeletal: Status post surgical posterior fusion of L4-5. Multilevel degenerative disc disease is noted in the lumbar spine. No acute abnormality is noted. IMPRESSION: Mild gallbladder wall thickening or pericholecystic fluid is noted, but no definite cholelithiasis is noted. Ultrasound is recommended for further evaluation. Sigmoid diverticulosis without inflammation. Small amount of free fluid is noted in the pelvis. Aortic Atherosclerosis (ICD10-I70.0). Electronically Signed   By: Lupita RaiderJames  Green Jr M.D.   On: 03/28/2019 11:02   Dg Chest Port 1 View  Result Date: 03/28/2019 CLINICAL DATA:  Shortness of breath. EXAM: PORTABLE CHEST 1 VIEW COMPARISON:  09/29/2016. FINDINGS: Cardiac pacer with lead tip over the right atrium right ventricle. Prior cardiac valve replacement. Cardiomegaly. Mild pulmonary venous congestion. Mild bilateral interstitial prominence. Small bilateral pleural effusions can not be excluded. No pneumothorax. IMPRESSION: Cardiac pacer in stable position. Prior cardiac valve replacement. Cardiomegaly with mild pulmonary vascular prominence and interstitial prominence. Tiny  bilateral pleural effusions. Findings suggest CHF. Pneumonitis cannot be excluded. Electronically Signed   By: Maisie Fushomas  Register   On: 03/28/2019 10:20   [place] Abdomen Limited Ruq  Result Date: 03/28/2019 CLINICAL DATA:  Abnormal gallbladder on recent CT EXAM: ULTRASOUND ABDOMEN LIMITED RIGHT UPPER QUADRANT COMPARISON:  CT from earlier in the same day. FINDINGS: Gallbladder: Gallbladder is well distended. Wall thickening to 7 mm is noted with evidence of pericholecystic fluid. Negative sonographic Eulah PontMurphy sign is elicited. These changes remain suspicious for cholecystitis. Common bile duct: Diameter: 3 mm. Liver: Mild nodularity of the liver is noted without focal mass. Portal vein demonstrates some bidirectional flow indicating a mild degree of portal hypertension. Mild ascites is seen. IMPRESSION: Gallbladder wall thickening with pericholecystic fluid. These changes may be related to the underlying mild ascites although the possibility of cholecystitis could not be totally excluded. HIDA scan may be helpful for clarification. Mild nodularity of the liver and changes suggestive of very early portal hypertension. Electronically Signed   By: Alcide CleverMark  Lukens M.D.   On: 03/28/2019 12:31    Pending Labs Unresulted Labs (From admission, onward)    Start     Ordered   03/28/19 1404  Brain natriuretic peptide  Add-on,   AD     03/28/19 1403   03/28/19 1338  Urine Culture  Add-on,   AD     03/28/19 1338   03/28/19 1338  TSH  Add-on,   AD     03/28/19 1338   03/28/19 1042  Novel Coronavirus,NAA,(SEND-OUT TO REF LAB - TAT 24-48 hrs); Hosp Order  (Asymptomatic Patients Labs)  Once,   STAT    Question:  Rule Out  Answer:  Yes   03/28/19 1041   Signed and Held  CBC  (enoxaparin (LOVENOX)    CrCl >/= 30 ml/min)  Once,   R    Comments: Baseline for enoxaparin therapy IF NOT ALREADY DRAWN.  Notify MD if PLT < 100 K.    Signed and Held   Signed and Held  Creatinine, serum  (enoxaparin (LOVENOX)    CrCl >/= 30 ml/min)   Once,   R    Comments: Baseline for enoxaparin therapy IF NOT ALREADY DRAWN.    Signed and Held   Signed and Held  Creatinine, serum  (enoxaparin (  LOVENOX)    CrCl >/= 30 ml/min)  Weekly,   R    Comments: while on enoxaparin therapy    Signed and Held   Signed and Held  Basic metabolic panel  Tomorrow morning,   R     Signed and Held          Vitals/Pain Today's Vitals   03/28/19 1100 03/28/19 1130 03/28/19 1200 03/28/19 1300  BP: (!) 156/37 (!) 161/38  (!) 125/93  Pulse: 69 67 70 68  Resp: 14 (!) 21 19 14   Temp:      TempSrc:      SpO2: 98% 97% 100% 98%  PainSc: 4        Isolation Precautions No active isolations  Medications Medications  cefTRIAXone (ROCEPHIN) 1 g in sodium chloride 0.9 % 100 mL IVPB (has no administration in time range)  ondansetron (ZOFRAN) injection 4 mg (has no administration in time range)  morphine 2 MG/ML injection 2 mg (2 mg Intravenous Given 03/28/19 1006)  ondansetron (ZOFRAN) injection 4 mg (4 mg Intravenous Given 03/28/19 1004)  iohexol (OMNIPAQUE) 300 MG/ML solution 75 mL (75 mLs Intravenous Contrast Given 03/28/19 1037)    Mobility walks with person assist Low fall risk   Focused Assessments Cardiac Assessment Handoff:    No results found for: CKTOTAL, CKMB, CKMBINDEX, TROPONINI No results found for: DDIMER Does the Patient currently have chest pain? No   , Pulmonary Assessment Handoff:  Lung sounds:  clear bil    Pt is A&Ox4, ambulatory to bathroom with assistance. No pain at this time.   R Recommendations: See Admitting Provider Note  Report given to:   Additional Notes:

## 2019-03-28 NOTE — ED Provider Notes (Signed)
[facility] Emergency Department Provider Note   ____________________________________________    I have reviewed the triage vital signs and the nursing notes.   HISTORY  Chief Complaint Abdominal Pain     HPI [name] is a 85 y.o. female who presents with complaints of diffuse abdominal pain decreased appetite over the last month.  Patient describes bloating cramping abdominal pain, some constipation which seems to have improved.  Intermittent nausea, decreased appetite.  Pain is generalized and moderate in nature and does come and go.  No fevers or chills.  No history of abdominal surgery.  Has not taken anything for this.  Also has noted some mild shortness of breath over the last several days  Past Medical History:  Diagnosis Date  . Dysrhythmia    A-fib  . H/O aortic valve replacement   . Hyperlipidemia   . Hypertension     There are no active problems to display for this patient.   Past Surgical History:  Procedure Laterality Date  . aortic stenosos    . CATARACT EXTRACTION    . EYE SURGERY    . GLAUCOMA SURGERY    . PACEMAKER INSERTION Right   . PACEMAKER INSERTION Right 10/16/2015   Procedure: ICD GENERATOR CHANGE;  Surgeon: Isaias Cowman, MD;  Location: [facility] ORS;  Service: Cardiovascular;  Laterality: Right;    Prior to Admission medications   Medication Sig Start Date End Date Taking? Authorizing Provider  acetaminophen (TYLENOL) 325 MG tablet Take 650 mg by mouth every 6 (six) hours as needed.   Yes [provider]  amLODipine (NORVASC) 2.5 MG tablet Take 2.5 mg by mouth daily.    Yes [provider]  brimonidine (ALPHAGAN P) 0.15 % ophthalmic solution Place 1 drop into the right eye 2 (two) times a day. 02/15/19  Yes [provider]  docusate sodium (COLACE) 100 MG capsule Take 100 mg by mouth daily.   Yes [provider]  dorzolamide-timolol (COSOPT) 22.3-6.8 MG/ML ophthalmic solution  Place 1 drop into the right eye 2 (two) times a day. 03/01/19  Yes [provider]  estradiol (ESTRACE) 0.1 MG/GM vaginal cream Place 1 Applicatorful vaginally at bedtime.   Yes [provider]  Multiple Vitamin (MULTIVITAMIN WITH MINERALS) TABS tablet Take 1 tablet by mouth daily.   Yes [provider]  VITAMIN D, CHOLECALCIFEROL, PO Take 1 tablet by mouth daily.   Yes [provider]     Allergies Lisinopril, Sulfur, Amoxicillin, Betoptic [betaxolol], Biaxin [clarithromycin], Cephalexin, Furosemide, Gabapentin, Hydrochlorothiazide, Mydriacyl [tropicamide], Penicillins, and Naproxen  No family history on file.  Social History Social History   Tobacco Use  . Smoking status: Never Smoker  . Smokeless tobacco: Never Used  Substance Use Topics  . Alcohol use: Yes    Comment: wine occ  . Drug use: No    Review of Systems  Constitutional: No fever/chills Eyes: No visual changes.  ENT: No sore throat. Cardiovascular: Denies chest pain. Respiratory: Mild shortness of breath Gastrointestinal: As above Genitourinary: Negative for dysuria. Musculoskeletal: Negative for back pain. Skin: Negative for rash. Neurological: Negative for headaches    ____________________________________________   PHYSICAL EXAM:  VITAL SIGNS: ED Triage Vitals  Enc Vitals Group     BP 03/28/19 0914 (!) 184/43     Pulse Rate 03/28/19 0914 81     Resp 03/28/19 0914 16     Temp 03/28/19 0914 98 F (36.7 C)     Temp Source 03/28/19 0914 Oral  SpO2 03/28/19 0914 98 %     Weight --      Height --      Head Circumference --      Peak Flow --      Pain Score 03/28/19 0920 6     Pain Loc --      Pain Edu? --      Excl. in GC? --     Constitutional: Alert and oriented.  .   Mouth/Throat: Mucous membranes are moist.   Neck:  Painless ROM Cardiovascular: Normal rate, regular rhythm.   Good peripheral circulation. Respiratory: Normal respiratory effort.  No  retractions. Lungs CTAB. Gastrointestinal: Soft and nontender. No distention.  No CVA tenderness. Genitourinary: deferred Musculoskeletal:  Warm and well perfused Neurologic:  Normal speech and language. No gross focal neurologic deficits are appreciated.  Skin:  Skin is warm, dry and intact. No rash noted. Psychiatric: Mood and affect are normal. Speech and behavior are normal.  ____________________________________________   LABS (all labs ordered are listed, but only abnormal results are displayed)  Labs Reviewed  COMPREHENSIVE METABOLIC PANEL - Abnormal; Notable for the following components:      Result Value   Sodium 125 (*)    Chloride 95 (*)    CO2 18 (*)    Glucose, Bld 142 (*)    Creatinine, Ser 1.12 (*)    AST 49 (*)    ALT 70 (*)    GFR calc non Af Amer 44 (*)    GFR calc Af Amer 51 (*)    All other components within normal limits  URINALYSIS, COMPLETE (UACMP) WITH MICROSCOPIC - Abnormal; Notable for the following components:   Color, Urine YELLOW (*)    APPearance HAZY (*)    Protein, ur 100 (*)    Leukocytes,Ua MODERATE (*)    Bacteria, UA FEW (*)    All other components within normal limits  NOVEL CORONAVIRUS, NAA (HOSPITAL ORDER, SEND-OUT TO REF LAB)  URINE CULTURE  LIPASE, BLOOD  CBC  TSH   ____________________________________________  EKG  ED ECG REPORT I, Jene Everyobert Tomothy Eddins, the attending physician, personally viewed and interpreted this ECG.  Date: 03/28/2019  Rhythm: AV paced rhythm QRS Axis: normal Intervals:abnormal ST/T Wave abnormalities: Nonspecific Narrative Interpretation: no evidence of acute ischemia  ____________________________________________  RADIOLOGY  Chest x-ray demonstrates pulmonary vascular congestion CT scan demonstrates possible inflammation of gallbladder, will obtain ultrasound Ultrasound demonstrates mild gallbladder wall thickening but no cholelithiasis noted  ____________________________________________    PROCEDURES  Procedure(s) performed: No  Procedures   Critical Care performed: No ____________________________________________   INITIAL IMPRESSION / ASSESSMENT AND PLAN / ED COURSE  Pertinent labs & imaging results that were available during my care of the patient were reviewed by me and considered in my medical decision making (see chart for details).  She presents with vague abdominal discomfort over the last month with decreased p.o. intake, mild nausea now which is new with some shortness of breath over the last 2 days.  Afebrile.  We will obtain labs, place IV, give IV Zofran, IV morphine obtain CT abdomen pelvis and reevaluate   Patient had significant provement after IV morphine IV Zofran.  CT and ultrasound are inconclusive, no significant tenderness over the right upper quadrant.  Chest x-ray demonstrates vascular congestion which may explain some of her mild shortness of breath.  However her sodium is low at 125 which could explain her weakness as well.  I discussed with the hospitalist for admission  ____________________________________________   FINAL CLINICAL IMPRESSION(S) / ED DIAGNOSES  Final diagnoses:  Abdominal pain  Hyponatremia  Weakness  Pulmonary vascular congestion        Note:  This document was prepared using Dragon voice recognition software and may include unintentional dictation errors.   Jene EveryKinner, Gaylord Seydel, MD 03/28/19 1332

## 2019-03-28 NOTE — ED Triage Notes (Signed)
Daughter reports pt has been having "digestion issues" for about 5-6 weeks, increased constipation and abdominal pain. Daughter reports in the past week, pain has increased across the abdomen, pt has been urinating and having BMs w/out diarrhea, but has decreased appetite and nausea and vomiting. Increased belching reported by daughter. Some relief with tylenol and gas x. Daughter reports lower BP, has had medication changed by Dr Ubaldo Glassing last week.

## 2019-03-28 NOTE — H&P (Signed)
[employer] - [place] at [facility]   PATIENT NAME: [name]    MR#:  [id number]  DATE OF BIRTH:  10/12/1928  DATE OF ADMISSION:  03/28/2019  PRIMARY CARE PHYSICIAN: Mickey Farberhies, David, MD   REQUESTING/REFERRING PHYSICIAN: dr Cyril Loosenkinner  CHIEF COMPLAINT:    Nausea HISTORY OF PRESENT ILLNESS:  [name] CollumRita Dedominicis  is a 85 y.o. female with a known history of aortic valve replacement and hypertension who presents to the ER due to nausea.  Patient reports over several days she has had increasing nausea and vomiting.  She has had right upper quadrant abdominal pain for the past month.  She denies fever chills.  She denies shortness of breath.  She also has lower extremity edema.  She reports that Dr. Lady GaryFath her cardiologist decrease the dose of Norvasc due to lower extremity edema.  In the emergency room CT of the abdomen shows no acute pathology however abdominal ultrasound shows gallbladder wall thickening. I have ordered a HIDA scan to better evaluate gallbladder wall thickening.  PAST MEDICAL HISTORY:   Past Medical History:  Diagnosis Date  . Dysrhythmia    A-fib  . H/O aortic valve replacement   . Hyperlipidemia   . Hypertension     PAST SURGICAL HISTORY:   Past Surgical History:  Procedure Laterality Date  . aortic stenosos    . CATARACT EXTRACTION    . EYE SURGERY    . GLAUCOMA SURGERY    . PACEMAKER INSERTION Right   . PACEMAKER INSERTION Right 10/16/2015   Procedure: ICD GENERATOR CHANGE;  Surgeon: Marcina MillardAlexander Paraschos, MD;  Location: [facility] ORS;  Service: Cardiovascular;  Laterality: Right;    SOCIAL HISTORY:   Social History   Tobacco Use  . Smoking status: Never Smoker  . Smokeless tobacco: Never Used  Substance Use Topics  . Alcohol use: Yes    Comment: wine occ    FAMILY HISTORY:  No family history on file.  DRUG ALLERGIES:   Allergies  Allergen Reactions  . Lisinopril Shortness Of Breath and Cough  . Sulfur Nausea And Vomiting  . Amoxicillin  Nausea And Vomiting  . Betoptic [Betaxolol] Other (See Comments)    headache  . Biaxin [Clarithromycin] Nausea And Vomiting  . Cephalexin Nausea And Vomiting  . Furosemide Other (See Comments)    Thirsty dry mouth  . Gabapentin Other (See Comments)    "spacey" passed out feeling blurry vision  . Hydrochlorothiazide Other (See Comments)    Lightheaded, thirsty, stomach pain, diarrhea  . Mydriacyl [Tropicamide] Other (See Comments)    Eyes remained dilatated for long time  . Penicillins Nausea And Vomiting  . Naproxen Rash    REVIEW OF SYSTEMS:   Review of Systems  Constitutional: Negative.  Negative for chills, fever and malaise/fatigue.  HENT: Negative.  Negative for ear discharge, ear pain, hearing loss, nosebleeds and sore throat.   Eyes: Negative.  Negative for blurred vision and pain.  Respiratory: Negative.  Negative for cough, hemoptysis, shortness of breath and wheezing.   Cardiovascular: Positive for leg swelling. Negative for chest pain and palpitations.  Gastrointestinal: Positive for abdominal pain, nausea and vomiting. Negative for blood in stool and diarrhea.  Genitourinary: Negative.  Negative for dysuria.  Musculoskeletal: Negative.  Negative for back pain.  Skin: Negative.   Neurological: Negative for dizziness, tremors, speech change, focal weakness, seizures and headaches.  Endo/Heme/Allergies: Negative.  Does not bruise/bleed easily.  Psychiatric/Behavioral: Negative.  Negative for depression, hallucinations and suicidal ideas.    MEDICATIONS  AT HOME:   Prior to Admission medications   Medication Sig Start Date End Date Taking? Authorizing Provider  acetaminophen (TYLENOL) 325 MG tablet Take 650 mg by mouth every 6 (six) hours as needed.   Yes [provider]  amLODipine (NORVASC) 2.5 MG tablet Take 2.5 mg by mouth daily.    Yes [provider]  brimonidine (ALPHAGAN P) 0.15 % ophthalmic solution Place 1 drop into the right eye 2 (two)  times a day. 02/15/19  Yes [provider]  docusate sodium (COLACE) 100 MG capsule Take 100 mg by mouth daily.   Yes [provider]  dorzolamide-timolol (COSOPT) 22.3-6.8 MG/ML ophthalmic solution Place 1 drop into the right eye 2 (two) times a day. 03/01/19  Yes [provider]  estradiol (ESTRACE) 0.1 MG/GM vaginal cream Place 1 Applicatorful vaginally at bedtime.   Yes [provider]  Multiple Vitamin (MULTIVITAMIN WITH MINERALS) TABS tablet Take 1 tablet by mouth daily.   Yes [provider]  VITAMIN D, CHOLECALCIFEROL, PO Take 1 tablet by mouth daily.   Yes [provider]      VITAL SIGNS:  Blood pressure (!) 125/93, pulse 68, temperature 98 F (36.7 C), temperature source Oral, resp. rate 14, SpO2 98 %.  PHYSICAL EXAMINATION:   Physical Exam Constitutional:      General: She is not in acute distress. HENT:     Head: Normocephalic.     Mouth/Throat:     Pharynx: Oropharynx is clear.  Eyes:     General: No scleral icterus. Neck:     Musculoskeletal: Normal range of motion and neck supple.     Vascular: No JVD.     Trachea: No tracheal deviation.  Cardiovascular:     Rate and Rhythm: Normal rate and regular rhythm.     Heart sounds: Murmur present. No friction rub. No gallop.   Pulmonary:     Effort: Pulmonary effort is normal. No respiratory distress.     Breath sounds: Normal breath sounds. No wheezing or rales.  Chest:     Chest wall: No tenderness.  Abdominal:     General: Bowel sounds are normal. There is no distension.     Palpations: Abdomen is soft. There is fluid wave. There is no mass.     Tenderness: There is abdominal tenderness in the right upper quadrant. There is no guarding or rebound. Positive signs include Murphy's sign and Rovsing's sign.  Musculoskeletal: Normal range of motion.  Skin:    General: Skin is warm.     Findings: No erythema or rash.  Neurological:     General: No focal deficit  present.     Mental Status: She is alert and oriented to person, place, and time.  Psychiatric:        Mood and Affect: Mood normal.        Behavior: Behavior normal.        Judgment: Judgment normal.       LABORATORY PANEL:   CBC Recent Labs  Lab 03/28/19 0942  WBC 7.6  HGB 12.9  HCT 37.5  PLT 196   ------------------------------------------------------------------------------------------------------------------  Chemistries  Recent Labs  Lab 03/28/19 0942  NA 125*  K 4.3  CL 95*  CO2 18*  GLUCOSE 142*  BUN 22  CREATININE 1.12*  CALCIUM 9.2  AST 49*  ALT 70*  ALKPHOS 100  BILITOT 1.2   ------------------------------------------------------------------------------------------------------------------  Cardiac Enzymes No results for input(s): TROPONINI in the last 168 hours. ------------------------------------------------------------------------------------------------------------------  RADIOLOGY:  Ct Abdomen Pelvis W Contrast  Result Date: 03/28/2019 CLINICAL DATA:  Generalized abdominal pain. EXAM: CT ABDOMEN AND PELVIS WITH CONTRAST TECHNIQUE: Multidetector CT imaging of the abdomen and pelvis was performed using the standard protocol following bolus administration of intravenous contrast. CONTRAST:  75mL OMNIPAQUE IOHEXOL 300 MG/ML  SOLN COMPARISON:  None. FINDINGS: Lower chest: No acute abnormality. Hepatobiliary: No definite cholelithiasis is noted. Mild wall thickening of the gallbladder or pericholecystic fluid is noted. No biliary dilatation is noted. The liver is unremarkable. Pancreas: Unremarkable. No pancreatic ductal dilatation or surrounding inflammatory changes. Spleen: Normal in size without focal abnormality. Adrenals/Urinary Tract: Adrenal glands are unremarkable. Kidneys are normal, without renal calculi, focal lesion, or hydronephrosis. Bladder is unremarkable. Stomach/Bowel: Stomach is within normal limits. Appendix appears normal. No evidence  of bowel wall thickening, distention, or inflammatory changes. Sigmoid diverticulosis is noted without inflammation. Vascular/Lymphatic: Aortic atherosclerosis. No enlarged abdominal or pelvic lymph nodes. Reproductive: Uterus and bilateral adnexa are unremarkable. Cerclage device is noted. Other: Small amount of free fluid is noted in the pelvis. No definite hernia is noted. Musculoskeletal: Status post surgical posterior fusion of L4-5. Multilevel degenerative disc disease is noted in the lumbar spine. No acute abnormality is noted. IMPRESSION: Mild gallbladder wall thickening or pericholecystic fluid is noted, but no definite cholelithiasis is noted. Ultrasound is recommended for further evaluation. Sigmoid diverticulosis without inflammation. Small amount of free fluid is noted in the pelvis. Aortic Atherosclerosis (ICD10-I70.0). Electronically Signed   By: Lupita RaiderJames  Green Jr M.D.   On: 03/28/2019 11:02   Dg Chest Port 1 View  Result Date: 03/28/2019 CLINICAL DATA:  Shortness of breath. EXAM: PORTABLE CHEST 1 VIEW COMPARISON:  09/29/2016. FINDINGS: Cardiac pacer with lead tip over the right atrium right ventricle. Prior cardiac valve replacement. Cardiomegaly. Mild pulmonary venous congestion. Mild bilateral interstitial prominence. Small bilateral pleural effusions can not be excluded. No pneumothorax. IMPRESSION: Cardiac pacer in stable position. Prior cardiac valve replacement. Cardiomegaly with mild pulmonary vascular prominence and interstitial prominence. Tiny bilateral pleural effusions. Findings suggest CHF. Pneumonitis cannot be excluded. Electronically Signed   By: Maisie Fushomas  Register   On: 03/28/2019 10:20   [place] Abdomen Limited Ruq  Result Date: 03/28/2019 CLINICAL DATA:  Abnormal gallbladder on recent CT EXAM: ULTRASOUND ABDOMEN LIMITED RIGHT UPPER QUADRANT COMPARISON:  CT from earlier in the same day. FINDINGS: Gallbladder: Gallbladder is well distended. Wall thickening to 7 mm is noted with  evidence of pericholecystic fluid. Negative sonographic Eulah PontMurphy sign is elicited. These changes remain suspicious for cholecystitis. Common bile duct: Diameter: 3 mm. Liver: Mild nodularity of the liver is noted without focal mass. Portal vein demonstrates some bidirectional flow indicating a mild degree of portal hypertension. Mild ascites is seen. IMPRESSION: Gallbladder wall thickening with pericholecystic fluid. These changes may be related to the underlying mild ascites although the possibility of cholecystitis could not be totally excluded. HIDA scan may be helpful for clarification. Mild nodularity of the liver and changes suggestive of very early portal hypertension. Electronically Signed   By: Alcide CleverMark  Lukens M.D.   On: 03/28/2019 12:31    EKG:  AV paced rhythm  IMPRESSION AND PLAN:   85 year old female with history of aortic valve replacement and essential hypertension who presents emergency room due to diffuse abdominal pain.   1. Hyponatremia from decreased PO intake and nausea: Gentle hydration and repeat BMP in a.m.   2.  Abdominal pain with CT scan of the abdomen showing no acute pathology however abdominal ultrasound shows gallbladder wall thickening.  HIDA scan ordered Surgical consultation requested via epic Supportive management with PRN pain medications and antiemetics Suspect etiology of nausea and vomiting is due to gallbladder disease.  3.  Urinary tract infection: Follow-up on urine culture and start Rocephin.  Patient reports nausea with cephalosporins.  Zofran as needed antibiotics.  4  Lower extremity edema: Recently Norvasc was decreased due to lower extremity edema. Echocardiogram ordered to evaluate for underlying CHF Cardiology consultation requested.   5  History of aortic valve replacement: Cardiology consultation requested Audible heart murmur on physical examination.  6  Essential hypertension: Continue Norvasc  7.  Glaucoma: Continue eyedrops   All  the records are reviewed and case discussed with ED provider. Management plans discussed with the patient and she is in agreement  CODE STATUS: DNR  TOTAL TIME TAKING CARE OF THIS PATIENT: 43 minutes.    Bettey Costa M.D on 03/28/2019 at 1:58 PM  Between 7am to 6pm - Pager - [phone]  After 6pm go to [email or web address] - password [email or web address] [facility]  Office  [phone]  CC: Primary care physician; Ezequiel Kayser, MD

## 2019-03-28 NOTE — Progress Notes (Signed)
Family Meeting Note  Advance Directive:yes  Today a meeting took place with the Patient.   The following clinical team members were present during this meeting:MD  The following were discussed:Patient's diagnosis: Nausea and vomiting with possible gallbladder pathology Heart murmur with aortic valve replacement , Patient's progosis: > 12 months and Goals for treatment: DNR  Additional follow-up to be provided: Advanced directives up-to-date.  Patient remains DNR.  No further inpatient work-up.  Patient would benefit from outpatient palliative care services upon discharge.  Time spent during discussion: 16 minutes  Bettey Costa, MD

## 2019-03-29 ENCOUNTER — Inpatient Hospital Stay: Payer: Medicare Other

## 2019-03-29 LAB — URINE CULTURE: Culture: NO GROWTH

## 2019-03-29 LAB — BASIC METABOLIC PANEL
Anion gap: 9 (ref 5–15)
BUN: 19 mg/dL (ref 8–23)
CO2: 18 mmol/L — ABNORMAL LOW (ref 22–32)
Calcium: 8.7 mg/dL — ABNORMAL LOW (ref 8.9–10.3)
Chloride: 102 mmol/L (ref 98–111)
Creatinine, Ser: 1.18 mg/dL — ABNORMAL HIGH (ref 0.44–1.00)
GFR calc Af Amer: 48 mL/min — ABNORMAL LOW (ref >60)
GFR calc non Af Amer: 41 mL/min — ABNORMAL LOW (ref >60)
Glucose, Bld: 122 mg/dL — ABNORMAL HIGH (ref 70–99)
Potassium: 4.3 mmol/L (ref 3.5–5.1)
Sodium: 129 mmol/L — ABNORMAL LOW (ref 135–145)

## 2019-03-29 LAB — NOVEL CORONAVIRUS, NAA (HOSP ORDER, SEND-OUT TO REF LAB; TAT 18-24 HRS): SARS-CoV-2, NAA: NOT DETECTED

## 2019-03-29 LAB — ECHOCARDIOGRAM COMPLETE

## 2019-03-29 MED ORDER — AMLODIPINE BESYLATE 5 MG PO TABS
5.0000 mg | ORAL_TABLET | Freq: Every day | ORAL | Status: DC
  Administered 2019-03-29 – 2019-03-30 (×2): 5 mg via ORAL
  Filled 2019-03-29 (×2): qty 1

## 2019-03-29 MED ORDER — TECHNETIUM TC 99M TETROFOSMIN IV KIT: 5.1310 | PACK | Freq: Once | INTRAVENOUS | Status: DC | PRN

## 2019-03-29 MED ORDER — TECHNETIUM TC 99M MEBROFENIN IV KIT
5.1310 | PACK | Freq: Once | INTRAVENOUS | Status: AC | PRN
  Administered 2019-03-29: 08:00:00 5.131 via INTRAVENOUS

## 2019-03-29 MED ORDER — FUROSEMIDE 10 MG/ML IJ SOLN
20.0000 mg | Freq: Once | INTRAMUSCULAR | Status: AC
  Administered 2019-03-29: 13:00:00 20 mg via INTRAVENOUS
  Filled 2019-03-29: qty 2

## 2019-03-29 NOTE — Progress Notes (Signed)
[facility] [place] NAME: [name]    MR#:  [id number]  DATE OF BIRTH:  1929/08/31  SUBJECTIVE:  CHIEF COMPLAINT:   Chief Complaint  Patient presents with  . Abdominal Pain   The patient feels better, no abdominal pain, nausea or vomiting.  She still has bilateral leg edema.  She said she has congestion and wheezing sometimes for the past 1 months. REVIEW OF SYSTEMS:  Review of Systems  Constitutional: Negative for chills, fever and malaise/fatigue.  HENT: Negative for sore throat.   Eyes: Negative for blurred vision and double vision.  Respiratory: Negative for cough, hemoptysis, shortness of breath, wheezing and stridor.   Cardiovascular: Positive for leg swelling. Negative for chest pain, palpitations and orthopnea.  Gastrointestinal: Negative for abdominal pain, blood in stool, diarrhea, melena, nausea and vomiting.  Genitourinary: Negative for dysuria, flank pain and hematuria.  Musculoskeletal: Negative for back pain and joint pain.  Skin: Negative for rash.  Neurological: Negative for dizziness, sensory change, focal weakness, seizures, loss of consciousness, weakness and headaches.  Endo/Heme/Allergies: Negative for polydipsia.  Psychiatric/Behavioral: Negative for depression. The patient is not nervous/anxious.     DRUG ALLERGIES:   Allergies  Allergen Reactions  . Lisinopril Shortness Of Breath and Cough  . Sulfur Nausea And Vomiting  . Amoxicillin Nausea And Vomiting  . Betoptic [Betaxolol] Other (See Comments)    headache  . Biaxin [Clarithromycin] Nausea And Vomiting  . Cephalexin Nausea And Vomiting  . Furosemide Other (See Comments)    Thirsty dry mouth  . Gabapentin Other (See Comments)    "spacey" passed out feeling blurry vision  . Hydrochlorothiazide Other (See Comments)    Lightheaded, thirsty, stomach pain, diarrhea  . Mydriacyl [Tropicamide] Other (See Comments)    Eyes remained dilatated for long  time  . Penicillins Nausea And Vomiting  . Naproxen Rash   VITALS:  Blood pressure (!) 150/52, pulse 84, temperature 98.4 F (36.9 C), resp. rate 18, SpO2 97 %. PHYSICAL EXAMINATION:  Physical Exam HENT:     Head: Normocephalic.     Mouth/Throat:     Mouth: Mucous membranes are moist.  Eyes:     General: No scleral icterus.    Conjunctiva/sclera: Conjunctivae normal.     Pupils: Pupils are equal, round, and reactive to light.  Neck:     Musculoskeletal: Normal range of motion and neck supple.     Vascular: No JVD.     Trachea: No tracheal deviation.  Cardiovascular:     Rate and Rhythm: Normal rate and regular rhythm.     Heart sounds: Normal heart sounds. No murmur. No gallop.   Pulmonary:     Effort: Pulmonary effort is normal. No respiratory distress.     Breath sounds: Normal breath sounds. No wheezing or rales.  Abdominal:     General: Bowel sounds are normal. There is no distension.     Palpations: Abdomen is soft.     Tenderness: There is no abdominal tenderness. There is no rebound.  Musculoskeletal: Normal range of motion.        General: No tenderness.     Right lower leg: Edema present.     Left lower leg: Edema present.  Skin:    Findings: No erythema or rash.  Neurological:     General: No focal deficit present.     Mental Status: She is alert and oriented to person, place, and time.     Cranial Nerves:  No cranial nerve deficit.  Psychiatric:        Mood and Affect: Mood normal.    LABORATORY PANEL:  Female CBC Recent Labs  Lab 03/28/19 0942  WBC 7.6  HGB 12.9  HCT 37.5  PLT 196   ------------------------------------------------------------------------------------------------------------------ Chemistries  Recent Labs  Lab 03/28/19 0942 03/29/19 0514  NA 125* 129*  K 4.3 4.3  CL 95* 102  CO2 18* 18*  GLUCOSE 142* 122*  BUN 22 19  CREATININE 1.12* 1.18*  CALCIUM 9.2 8.7*  AST 49*  --   ALT 70*  --   ALKPHOS 100  --   BILITOT 1.2  --     RADIOLOGY:  Nm Hepato W/eject Fract  Result Date: 03/29/2019 CLINICAL DATA:  Weight loss, right-sided abdominal pain EXAM: NUCLEAR MEDICINE HEPATOBILIARY IMAGING WITH GALLBLADDER EF TECHNIQUE: Sequential images of the abdomen were obtained out to 60 minutes following intravenous administration of radiopharmaceutical. After oral ingestion of Ensure, gallbladder ejection fraction was determined. At 60 min, normal ejection fraction is greater than 33%. RADIOPHARMACEUTICALS:  5.1 mCi Tc-8247m  Choletec IV COMPARISON:  None. FINDINGS: Prompt uptake and biliary excretion of activity by the liver is seen. Gallbladder activity is visualized, consistent with patency of cystic duct. Biliary activity passes into small bowel, consistent with patent common bile duct. Calculated gallbladder ejection fraction is 45%. (Normal gallbladder ejection fraction with Ensure is greater than 33%.) IMPRESSION: 1.  The cystic and common bile ducts are patent. 2.  Gallbladder ejection fraction of 45% at 60 minutes. 3. The patient did not report pain or symptoms following oral ingestion of Ensure. Electronically Signed   By: Lauralyn PrimesAlex  Bibbey M.D.   On: 03/29/2019 10:32   ASSESSMENT AND PLAN:   85 year old female with history of aortic valve replacement and essential hypertension who presents emergency room due to diffuse abdominal pain.   1. Hyponatremia from decreased PO intake and nausea: Sodium is still low but improving.  Follow-up BMP.  2.  Abdominal pain with CT scan of the abdomen showing no acute pathology however abdominal ultrasound shows gallbladder wall thickening. HIDA scan is unremarkable.  Advance diet and no indication for surgery per surgical PA. Supportive management with PRN pain medications and antiemetics Suspect etiology of nausea and vomiting is due to gallbladder disease.   3.  Urinary tract infection: The patient has no urinary symptoms and urinalysis is unremarkable.  I do not think patient has UTI.  The patient was given IV Rocephin.  Patient reports nausea with cephalosporins.  Zofran as needed.  Discontinue Rocephin.  4   Possible acute mild diastolic CHF.  Lower extremity edema: Recently Norvasc was decreased due to lower extremity edema.  BNP is 1871. Cxr: Cardiomegaly with mild pulmonary vascular prominence and interstitial prominence. Tiny bilateral pleural effusions. Findings suggest CHF.  Echocardiogram: The left ventricle has normal systolic function with an ejection fraction of 60-65%.  Severe stenosis of aortic vale. Discontinue IV fluid, IV Lasix, fluid restriction, follow-up cardiology consult.  5  History of aortic valve replacement: Cardiology consultation requested Audible heart murmur on physical examination.  6  Essential hypertension: Continue Norvasc  7.  Glaucoma: Continue eyedrops  All the records are reviewed and case discussed with Care Management/Social Worker. Management plans discussed with the patient, her daughter and they are in agreement.  CODE STATUS: DNR  TOTAL TIME TAKING CARE OF THIS PATIENT: 35 minutes.   More than 50% of the time was spent in counseling/coordination of care: YES  POSSIBLE D/C IN  2 DAYS, DEPENDING ON CLINICAL CONDITION.   Shaune PollackQing Alonso Gapinski M.D on 03/29/2019 at 1:18 PM  Between 7am to 6pm - Pager - [phone]  After 6pm go to [email or web address] - Therapist, nutritionalpassword [email or web address] [email or web address]  [facility]

## 2019-03-29 NOTE — Evaluation (Signed)
Physical Therapy Evaluation Patient Details Name: [name] CollumRita Mccullough MRN: [id number] DOB: 01/20/1929 Today's Date: 03/29/2019   History of Present Illness  Pt admitted for hyponatremia. History includes pacemaker, Afib, HTN, and aortic valve replacement. Complaints of abdominal pain.  Clinical Impression  Pt is a pleasant 85 year old female who was admitted for hyponatremia. Pt demonstrates all bed mobility/transfers/ambulation at baseline level. Pt does not require any further PT needs at this time. Pt will be dc in house and does not require follow up. RN aware. Will dc current orders.     Follow Up Recommendations No PT follow up    Equipment Recommendations  None recommended by PT    Recommendations for Other Services       Precautions / Restrictions Precautions Precautions: Fall Restrictions Weight Bearing Restrictions: No      Mobility  Bed Mobility               General bed mobility comments: not performed as pt received in recliner  Transfers Overall transfer level: Modified independent Equipment used: Rolling walker (2 wheeled)             General transfer comment: upright posture. RW used safe techniqu  Ambulation/Gait Ambulation/Gait assistance: LandMin guard Gait Distance (Feet): 325 Feet Assistive device: Rolling walker (2 wheeled) Gait Pattern/deviations: Step-through pattern     General Gait Details: ambulated using RW. Reciprocal gait pattern performed. Slight fatigue during 2nd lap.  Stairs            Wheelchair Mobility    Modified Rankin (Stroke Patients Only)       Balance Overall balance assessment: Mild deficits observed, not formally tested                                           Pertinent Vitals/Pain Pain Assessment: No/denies pain    Home Living Family/patient expects to be discharged to:: Private residence Living Arrangements: Alone Available Help at Discharge: Family Type of Home: (duplex-daughter  lives next door) Home Access: Stairs to enter Entrance Stairs-Rails: None Entrance Stairs-Number of Steps: 1 Home Layout: One level Home Equipment: Environmental consultantWalker - 2 wheels;Walker - 4 wheels      Prior Function Level of Independence: Independent with assistive device(s)         Comments: uses rollater for all mobility     Hand Dominance        Extremity/Trunk Assessment   Upper Extremity Assessment Upper Extremity Assessment: Overall WFL for tasks assessed    Lower Extremity Assessment Lower Extremity Assessment: Overall WFL for tasks assessed       Communication   Communication: No difficulties  Cognition Arousal/Alertness: Awake/alert Behavior During Therapy: WFL for tasks assessed/performed Overall Cognitive Status: Within Functional Limits for tasks assessed                                        General Comments      Exercises Other Exercises Other Exercises: ambulated to bathroom with cga. Supervision for hygiene. Able to urinate and have BM   Assessment/Plan    PT Assessment Patent does not need any further PT services  PT Problem List Decreased strength       PT Treatment Interventions (eval only)    PT Goals (Current goals can be found in the  Care Plan section)  Acute Rehab PT Goals Patient Stated Goal: to go home PT Goal Formulation: All assessment and education complete, DC therapy Time For Goal Achievement: 03/29/19 Potential to Achieve Goals: Good    Frequency (eval only)   Barriers to discharge        Co-evaluation               AM-PAC PT "6 Clicks" Mobility  Outcome Measure Help needed turning from your back to your side while in a flat bed without using bedrails?: None Help needed moving from lying on your back to sitting on the side of a flat bed without using bedrails?: None Help needed moving to and from a bed to a chair (including a wheelchair)?: None Help needed standing up from a chair using your arms  (e.g., wheelchair or bedside chair)?: None Help needed to walk in hospital room?: None Help needed climbing 3-5 steps with a railing? : None 6 Click Score: 24    End of Session Equipment Utilized During Treatment: Gait belt Activity Tolerance: Patient tolerated treatment well Patient left: in chair Nurse Communication: Mobility status PT Visit Diagnosis: Muscle weakness (generalized) (M62.81)    Time: 7989-2119 PT Time Calculation (min) (ACUTE ONLY): 22 min   Charges:   PT Evaluation $PT Eval Low Complexity: 1 Low PT Treatments $Gait Training: 8-22 mins        Greggory Stallion, PT, DPT [phone]   [name] 03/29/2019, 3:06 PM

## 2019-03-29 NOTE — Progress Notes (Signed)
[facility] SURGICAL PROGRESS NOTE (cpt [phone])  Hospital Day(s): 1.   Post op day(s):  Marland Kitchen   Interval History: Patient seen and examined, no acute events or new complaints overnight. Patient reports that she is sore in her back from the HIDA scan this afternoon. Otherwise, she denied any abdominal pain, fevers, chills, nausea, or emesis. She reports that she feels much better from presentation. She is reporting hunger. No other complaints.   Review of Systems:  Constitutional: denies fever, chills  HEENT: denies cough or congestion  Respiratory: denies any shortness of breath  Cardiovascular: denies chest pain or palpitations  Gastrointestinal: denies abdominal pain, N/V, or diarrhea/and bowel function as per interval history Genitourinary: denies burning with urination or urinary frequency   Vital signs in last 24 hours: [min-max] current  Temp:  [97.5 F (36.4 C)-98.4 F (36.9 C)] 98.4 F (36.9 C) (07/01 0614) Pulse Rate:  [67-82] 81 (07/01 0614) Resp:  [14-24] 18 (07/01 0614) BP: (116-195)/(37-93) 158/71 (07/01 0614) SpO2:  [97 %-100 %] 97 % (07/01 0614)             Intake/Output last 2 shifts:  06/30 0701 - 07/01 0700 In: 346.2 [I.V.:246.2; IV Piggyback:100] Out: -    Physical Exam:  Constitutional: alert, cooperative and no distress  HENT: normocephalic without obvious abnormality  Eyes: PERRL, EOM's grossly intact and symmetric  Respiratory: breathing non-labored at rest  Gastrointestinal: soft, non-tender, and non-distended   Labs:  CBC Latest Ref Rng & Units 03/28/2019 01/28/2017 10/16/2015  WBC 4.0 - 10.5 K/uL 7.6 12.6(H) -  Hemoglobin 12.0 - 15.0 g/dL 12.9 13.1 13.6  Hematocrit 36.0 - 46.0 % 37.5 39.3 40.0  Platelets 150 - 400 K/uL 196 292 -   CMP Latest Ref Rng & Units 03/29/2019 03/28/2019 01/28/2017  Glucose 70 - 99 mg/dL 122(H) 142(H) 116(H)  BUN 8 - 23 mg/dL 19 22 20   Creatinine 0.44 - 1.00 mg/dL 1.18(H) 1.12(H) 1.21(H)  Sodium 135 - 145 mmol/L  129(L) 125(L) 137  Potassium 3.5 - 5.1 mmol/L 4.3 4.3 3.9  Chloride 98 - 111 mmol/L 102 95(L) 100(L)  CO2 22 - 32 mmol/L 18(L) 18(L) 27  Calcium 8.9 - 10.3 mg/dL 8.7(L) 9.2 9.9  Total Protein 6.5 - 8.1 g/dL - 6.6 -  Total Bilirubin 0.3 - 1.2 mg/dL - 1.2 -  Alkaline Phos 38 - 126 U/L - 100 -  AST 15 - 41 U/L - 49(H) -  ALT 0 - 44 U/L - 70(H) -     Imaging studies:   HIDA (07/01) personally reviewed and radiologist report reviewed and agree with below:  IMPRESSION: 1.  The cystic and common bile ducts are patent. 2.  Gallbladder ejection fraction of 45% at 60 minutes. 3. The patient did not report pain or symptoms following oral ingestion of Ensure.   Assessment/Plan: (ICD-10's: R10.31) 85 y.o. female with improved/resolved abdominal pain (~1 month history of this) of unclear etiology which does not appear related to gallbladder, complicated by pertinent comorbidities including history of Atrial Fibrillation s/p pacemaker placement in 2017, AV replacement, HLD, HTN, and advanced age.   - Okay for diet as tolerates   - pain control prn; antiemetics prn  - monitor abdominal examination; on-going bowel function  - sodium management per primary service   - No indication for surgical intervention   - further management per primary team  - general surgery will sign off; please call if new questions or concern arise   All of the  above findings and recommendations were discussed with the patient, and the medical team, and all of patient's questions were answered to his expressed satisfaction.  -- Daaiyah Baumert SchuLynden Oxfordlz, PA-C [facility] 03/29/2019, 7:19 AM [phone] M-F: 7am - 4pm

## 2019-03-29 NOTE — Consult Note (Signed)
[facility]  CARDIOLOGY CONSULT NOTE  Patient ID: [name] CollumRita Bisson MRN: [id number] DOB/AGE: 83/10/1928 83 y.o.  Admit date: 03/28/2019 Referring Physician Mody Primary Physician Mickey Farberhies, David, MD  Primary Cardiologist Fath Reason for Consultation lower extremity edema  HPI: 85 year old female referred for evaluation of lower extremity edema. The patient has a history of aortic stenosis, status post bioprosthetic aortic valve replacement in 2012, questionable history of paroxysmal atrial fibrillation, third degree AV block status post pacemaker, hypertension, and hyperlipidemia. The patient was evaluated by Dr. Lady GaryFath via telemedicine on 03/14/2019 at which time the patient complained of a several month history of shortness of breath, fatigue, abdominal distention and diastolic hypotension. Her amlodipine was decreased to 2.5 mg daily, and the patient was advised to call back if she had no improvement in symptoms, and repeat labs and echocardiogram would be ordered. The [facility] yesterday for further evaluation. Admission labs notable for sodium 129, creatinine 1.18, GFR 41, BNP 1,871. Chest xray revealed tiny bilateral pleural effusions, suggestive of CHF, and pneumonitis could not be excluded. 2D echocardiogram during this admission reveals normal left ventricular function with LVEF 60-65% with moderate LVH, and severe aortic stenosis with peak gradient 85.4 mmHg, mean gradient 53.6 mmHg. Currently, the patient reports feeling well. Her main complaint is feeling short of breath with minimal exertion. Her lower extremity edema has improved since admission. She denies chest pain, palpitations, orthopnea, or palpitations.  Review of systems complete and found to be negative unless listed above     Past Medical History:  Diagnosis Date  . Dysrhythmia    A-fib  . H/O aortic valve replacement   . Hyperlipidemia   . Hypertension     Past Surgical History:  Procedure Laterality Date   . aortic stenosos    . CATARACT EXTRACTION    . EYE SURGERY    . GLAUCOMA SURGERY    . PACEMAKER INSERTION Right   . PACEMAKER INSERTION Right 10/16/2015   Procedure: ICD GENERATOR CHANGE;  Surgeon: Marcina MillardAlexander Paraschos, MD;  Location: [facility] ORS;  Service: Cardiovascular;  Laterality: Right;    Medications Prior to Admission  Medication Sig Dispense Refill Last Dose  . acetaminophen (TYLENOL) 325 MG tablet Take 650 mg by mouth every 6 (six) hours as needed.   03/28/2019 at 0600  . amLODipine (NORVASC) 2.5 MG tablet Take 2.5 mg by mouth daily.    03/27/2019 at Unknown time  . brimonidine (ALPHAGAN P) 0.15 % ophthalmic solution Place 1 drop into the right eye 2 (two) times a day.   03/27/2019 at Unknown time  . docusate sodium (COLACE) 100 MG capsule Take 100 mg by mouth daily.   03/28/2019 at 0600  . dorzolamide-timolol (COSOPT) 22.3-6.8 MG/ML ophthalmic solution Place 1 drop into the right eye 2 (two) times a day.   03/27/2019 at Unknown time  . estradiol (ESTRACE) 0.1 MG/GM vaginal cream Place 1 Applicatorful vaginally at bedtime.   Past Week at Unknown time  . Multiple Vitamin (MULTIVITAMIN WITH MINERALS) TABS tablet Take 1 tablet by mouth daily.   03/27/2019 at Unknown time  . VITAMIN D, CHOLECALCIFEROL, PO Take 1 tablet by mouth daily.   03/27/2019 at Unknown time   Social History   Socioeconomic History  . Marital status: Widowed    Spouse name: Not on file  . Number of children: Not on file  . Years of education: Not on file  . Highest education level: Not on file  Occupational History  . Not on file  Social Needs  . Financial resource strain: Not on file  . Food insecurity    Worry: Not on file    Inability: Not on file  . Transportation needs    Medical: Not on file    Non-medical: Not on file  Tobacco Use  . Smoking status: Never Smoker  . Smokeless tobacco: Never Used  Substance and Sexual Activity  . Alcohol use: Yes    Comment: wine occ  . Drug use: No  . Sexual  activity: Not on file  Lifestyle  . Physical activity    Days per week: Not on file    Minutes per session: Not on file  . Stress: Not on file  Relationships  . Social Herbalist on phone: Not on file    Gets together: Not on file    Attends religious service: Not on file    Active member of club or organization: Not on file    Attends meetings of clubs or organizations: Not on file    Relationship status: Not on file  . Intimate partner violence    Fear of current or ex partner: Not on file    Emotionally abused: Not on file    Physically abused: Not on file    Forced sexual activity: Not on file  Other Topics Concern  . Not on file  Social History Narrative  . Not on file    History reviewed. No pertinent family history.    Review of systems complete and found to be negative unless listed above      PHYSICAL EXAM  General: Well developed, well nourished, sitting up in chair in no acute distress HEENT:  Normocephalic and atramatic Neck:  No JVD.  Lungs:  bibasilar crackles, normal effort of breathing on room air Heart: HRRR . 3/6 systolic murmur Abdomen: nondistended Msk:  Back normal, normal gait. Normal strength and tone for age. Extremities: 1+ bilateral lower extremity edema.   Neuro: Alert and oriented X 3. Psych:  Good affect, responds appropriately  Labs:   Lab Results  Component Value Date   WBC 7.6 03/28/2019   HGB 12.9 03/28/2019   HCT 37.5 03/28/2019   MCV 93.5 03/28/2019   PLT 196 03/28/2019    Recent Labs  Lab 03/28/19 0942 03/29/19 0514  NA 125* 129*  K 4.3 4.3  CL 95* 102  CO2 18* 18*  BUN 22 19  CREATININE 1.12* 1.18*  CALCIUM 9.2 8.7*  PROT 6.6  --   BILITOT 1.2  --   ALKPHOS 100  --   ALT 70*  --   AST 49*  --   GLUCOSE 142* 122*   No results found for: CKTOTAL, CKMB, CKMBINDEX, TROPONINI No results found for: CHOL No results found for: HDL No results found for: LDLCALC No results found for: TRIG No results  found for: CHOLHDL No results found for: LDLDIRECT    Radiology: Ct Abdomen Pelvis W Contrast  Result Date: 03/28/2019 CLINICAL DATA:  Generalized abdominal pain. EXAM: CT ABDOMEN AND PELVIS WITH CONTRAST TECHNIQUE: Multidetector CT imaging of the abdomen and pelvis was performed using the standard protocol following bolus administration of intravenous contrast. CONTRAST:  48mL OMNIPAQUE IOHEXOL 300 MG/ML  SOLN COMPARISON:  None. FINDINGS: Lower chest: No acute abnormality. Hepatobiliary: No definite cholelithiasis is noted. Mild wall thickening of the gallbladder or pericholecystic fluid is noted. No biliary dilatation is noted. The liver is unremarkable. Pancreas: Unremarkable. No pancreatic ductal dilatation or surrounding inflammatory changes.  Spleen: Normal in size without focal abnormality. Adrenals/Urinary Tract: Adrenal glands are unremarkable. Kidneys are normal, without renal calculi, focal lesion, or hydronephrosis. Bladder is unremarkable. Stomach/Bowel: Stomach is within normal limits. Appendix appears normal. No evidence of bowel wall thickening, distention, or inflammatory changes. Sigmoid diverticulosis is noted without inflammation. Vascular/Lymphatic: Aortic atherosclerosis. No enlarged abdominal or pelvic lymph nodes. Reproductive: Uterus and bilateral adnexa are unremarkable. Cerclage device is noted. Other: Small amount of free fluid is noted in the pelvis. No definite hernia is noted. Musculoskeletal: Status post surgical posterior fusion of L4-5. Multilevel degenerative disc disease is noted in the lumbar spine. No acute abnormality is noted. IMPRESSION: Mild gallbladder wall thickening or pericholecystic fluid is noted, but no definite cholelithiasis is noted. Ultrasound is recommended for further evaluation. Sigmoid diverticulosis without inflammation. Small amount of free fluid is noted in the pelvis. Aortic Atherosclerosis (ICD10-I70.0). Electronically Signed   By: Lupita RaiderJames  Green Jr  M.D.   On: 03/28/2019 11:02   Nm Hepato W/eject Fract  Result Date: 03/29/2019 CLINICAL DATA:  Weight loss, right-sided abdominal pain EXAM: NUCLEAR MEDICINE HEPATOBILIARY IMAGING WITH GALLBLADDER EF TECHNIQUE: Sequential images of the abdomen were obtained out to 60 minutes following intravenous administration of radiopharmaceutical. After oral ingestion of Ensure, gallbladder ejection fraction was determined. At 60 min, normal ejection fraction is greater than 33%. RADIOPHARMACEUTICALS:  5.1 mCi Tc-7852m  Choletec IV COMPARISON:  None. FINDINGS: Prompt uptake and biliary excretion of activity by the liver is seen. Gallbladder activity is visualized, consistent with patency of cystic duct. Biliary activity passes into small bowel, consistent with patent common bile duct. Calculated gallbladder ejection fraction is 45%. (Normal gallbladder ejection fraction with Ensure is greater than 33%.) IMPRESSION: 1.  The cystic and common bile ducts are patent. 2.  Gallbladder ejection fraction of 45% at 60 minutes. 3. The patient did not report pain or symptoms following oral ingestion of Ensure. Electronically Signed   By: Lauralyn PrimesAlex  Bibbey M.D.   On: 03/29/2019 10:32   Dg Chest Port 1 View  Result Date: 03/28/2019 CLINICAL DATA:  Shortness of breath. EXAM: PORTABLE CHEST 1 VIEW COMPARISON:  09/29/2016. FINDINGS: Cardiac pacer with lead tip over the right atrium right ventricle. Prior cardiac valve replacement. Cardiomegaly. Mild pulmonary venous congestion. Mild bilateral interstitial prominence. Small bilateral pleural effusions can not be excluded. No pneumothorax. IMPRESSION: Cardiac pacer in stable position. Prior cardiac valve replacement. Cardiomegaly with mild pulmonary vascular prominence and interstitial prominence. Tiny bilateral pleural effusions. Findings suggest CHF. Pneumonitis cannot be excluded. Electronically Signed   By: Maisie Fushomas  Register   On: 03/28/2019 10:20   [place] Abdomen Limited Ruq  Result Date:  03/28/2019 CLINICAL DATA:  Abnormal gallbladder on recent CT EXAM: ULTRASOUND ABDOMEN LIMITED RIGHT UPPER QUADRANT COMPARISON:  CT from earlier in the same day. FINDINGS: Gallbladder: Gallbladder is well distended. Wall thickening to 7 mm is noted with evidence of pericholecystic fluid. Negative sonographic Eulah PontMurphy sign is elicited. These changes remain suspicious for cholecystitis. Common bile duct: Diameter: 3 mm. Liver: Mild nodularity of the liver is noted without focal mass. Portal vein demonstrates some bidirectional flow indicating a mild degree of portal hypertension. Mild ascites is seen. IMPRESSION: Gallbladder wall thickening with pericholecystic fluid. These changes may be related to the underlying mild ascites although the possibility of cholecystitis could not be totally excluded. HIDA scan may be helpful for clarification. Mild nodularity of the liver and changes suggestive of very early portal hypertension. Electronically Signed   By: Eulah PontMark  Lukens M.D.  On: 03/28/2019 12:31    EKG: AV dual paced rhythm  ASSESSMENT AND PLAN:  1. Acute on chronic diastolic CHF, which presented as shortness of breath, peripheral edema, and abdominal distention, in the setting of severe aortic stenosis, status post AVR. Treatment complicated by hyponatremia and CKD. 2. Severe aortic stenosis with peak gradient 85.4 mmHg, mean gradient 53.6 mmHg, status post bioprosthetic AVR in 2012. Patient defers any invasive management of aortic stenosis.  3. CHB, status post pacemaker 4. Hypertension, diastolic hypotension 5. AKI on CKD with creatinine 1.18, GFR 41 6. Hyponatremia, sodium 129  Recommendations: 1. Recommend nephrology consult for management of hyponatremia and diuresis 2. Continue amlodipine 5 mg 3. No further cardiac diagnostics  Signed: Leanora Ivanoffnna Lanea Vankirk PA-C 03/29/2019, 1:37 PM

## 2019-03-30 LAB — BASIC METABOLIC PANEL
Anion gap: 8 (ref 5–15)
BUN: 28 mg/dL — ABNORMAL HIGH (ref 8–23)
CO2: 23 mmol/L (ref 22–32)
Calcium: 8.7 mg/dL — ABNORMAL LOW (ref 8.9–10.3)
Chloride: 102 mmol/L (ref 98–111)
Creatinine, Ser: 1.51 mg/dL — ABNORMAL HIGH (ref 0.44–1.00)
GFR calc Af Amer: 35 mL/min — ABNORMAL LOW (ref >60)
GFR calc non Af Amer: 31 mL/min — ABNORMAL LOW (ref >60)
Glucose, Bld: 119 mg/dL — ABNORMAL HIGH (ref 70–99)
Potassium: 4.4 mmol/L (ref 3.5–5.1)
Sodium: 133 mmol/L — ABNORMAL LOW (ref 135–145)

## 2019-03-30 LAB — MAGNESIUM: Magnesium: 1.9 mg/dL (ref 1.7–2.4)

## 2019-03-30 MED ORDER — FUROSEMIDE 20 MG PO TABS: 20.0000 mg | ORAL_TABLET | Freq: Every day | ORAL | 0 refills | Status: AC

## 2019-03-30 MED ORDER — FUROSEMIDE 10 MG/ML IJ SOLN
40.0000 mg | Freq: Once | INTRAMUSCULAR | Status: AC
  Administered 2019-03-30: 10:00:00 40 mg via INTRAVENOUS
  Filled 2019-03-30: qty 4

## 2019-03-30 MED ORDER — ENOXAPARIN SODIUM 30 MG/0.3ML ~~LOC~~ SOLN: 30.0000 mg | SUBCUTANEOUS | Status: DC

## 2019-03-30 NOTE — Progress Notes (Signed)
Per Dr Bridgett Larsson we are to  discontinue cardiac monitoring that was just ordered

## 2019-03-30 NOTE — TOC Initial Note (Signed)
Transition of Care [facility](TOC) - Initial/Assessment Note    Patient Details  Name: [name] MRN: [id number] Date of Birth: 01/21/1929  Transition of Care [facility](TOC) CM/SW Contact:    Allayne ButcherJeanna M Gayatri Teasdale, RN Phone Number: 03/30/2019, 10:45 AM  Clinical Narrative:                 Patient admitted with hyponatremia.  Patient is from home and lives in a duplex with her daughter right next door.  Patient is mostly independent, walks with a walker or cane, has a shower chair, housekeeping comes in once per month to assist with cleaning, private home aide comes in 3 hours per week and assists with helping the patient run errands, patient also has a medical alert pendent.  Patient has a cardiologist and will follow up with Dr. Lady GaryFath outpatient.  No home health needs identified at this time.   Patient has a long term care policy if long term care is needed in the future.  Patient's daughter will pick her up at discharge.   Expected Discharge Plan: Home/Self Care Barriers to Discharge: Barriers Resolved   Patient Goals and CMS Choice Patient states their goals for this hospitalization and ongoing recovery are:: Excited to go home      Expected Discharge Plan and Services Expected Discharge Plan: Home/Self Care   Discharge Planning Services: CM Consult   Living arrangements for the past 2 months: Apartment Expected Discharge Date: 03/30/19                                    Prior Living Arrangements/Services Living arrangements for the past 2 months: Apartment Lives with:: Self Patient language and need for interpreter reviewed:: No Do you feel safe going back to the place where you live?: Yes      Need for Family Participation in Patient Care: Yes (Comment) Care giver support system in place?: Yes (comment)(daughter lives right next door.) Current home services: Homehealth aide, Housekeeping Criminal Activity/Legal Involvement Pertinent to Current Situation/Hospitalization: No - Comment as  needed  Activities of Daily Living Home Assistive Devices/Equipment: Eyeglasses, Medical laboratory scientific officerCane (specify quad or straight), Walker (specify type), Dentures (specify type) ADL Screening (condition at time of admission) Patient's cognitive ability adequate to safely complete daily activities?: Yes Is the patient deaf or have difficulty hearing?: No Does the patient have difficulty seeing, even when wearing glasses/contacts?: Yes Does the patient have difficulty concentrating, remembering, or making decisions?: No Patient able to express need for assistance with ADLs?: Yes Does the patient have difficulty dressing or bathing?: No Independently performs ADLs?: Yes (appropriate for developmental age) Does the patient have difficulty walking or climbing stairs?: No Weakness of Legs: None Weakness of Arms/Hands: None  Permission Sought/Granted Permission sought to share information with : Case Manager Permission granted to share information with : Yes, Verbal Permission Granted              Emotional Assessment Appearance:: Appears younger than stated age Attitude/Demeanor/Rapport: Engaged Affect (typically observed): Accepting Orientation: : Oriented to Self, Oriented to Place, Oriented to  Time, Oriented to Situation Alcohol / Substance Use: Not Applicable Psych Involvement: No (comment)  Admission diagnosis:  Hyponatremia [E87.1] Weakness [R53.1] Generalized abdominal pain [R10.84] Pulmonary vascular congestion [R09.89] Abdominal pain [R10.9] Patient Active Problem List   Diagnosis Date Noted  . Hyponatremia 03/28/2019   PCP:  Mickey Farberhies, David, MD Pharmacy:   [facility] [address] - [place], [place] -  [place] [address] Phone: [phone] Fax: [phone]  [facility] [place], [place] - [address] [place] [address] Phone: [phone] Fax: [phone]     Social Determinants of Health  (SDOH) Interventions    Readmission Risk Interventions No flowsheet data found.

## 2019-03-30 NOTE — Progress Notes (Signed)
Pt here with new CHF.  Pt got up to bathroom and became short of breath with audible expiratory wheeze.  VSS. 96-98% on RA.  Lungs dimininshed. Received 20 mg Lasix on dayshift at 1300. Cardiology: Recommend nephrology consult for management of hyponatremia and diuresis.  Will continue to monitor. Dorna Bloom RN

## 2019-03-30 NOTE — Discharge Summary (Signed)
[employer] [facility] [place] [facility]   PATIENT NAME: [name] CollumRita Budzynski    MR#:  [id number]  DATE OF BIRTH:  03/29/1929  DATE OF ADMISSION:  03/28/2019   ADMITTING PHYSICIAN: Adrian SaranSital Mody, MD  DATE OF DISCHARGE:  03/30/2019 PRIMARY CARE PHYSICIAN: Mickey Farberhies, David, MD   ADMISSION DIAGNOSIS:  Hyponatremia [E87.1] Weakness [R53.1] Generalized abdominal pain [R10.84] Pulmonary vascular congestion [R09.89] Abdominal pain [R10.9] DISCHARGE DIAGNOSIS:  Active Problems:   Hyponatremia  SECONDARY DIAGNOSIS:   Past Medical History:  Diagnosis Date  . Dysrhythmia    A-fib  . H/O aortic valve replacement   . Hyperlipidemia   . Hypertension    HOSPITAL COURSE:   85 year old female with history of aortic valve replacement and essential hypertension who presents emergency room due to diffuse abdominal pain.   1. Hyponatremia from decreased PO intake andnausea: Much improved.  2. Abdominal pain with CT scan of the abdomen showing no acute pathology however abdominal ultrasound shows gallbladder wall thickening. Supportive management with PRN pain medications and antiemetics Suspect etiology of nausea and vomiting is due to gallbladder disease. HIDA scan is unremarkable.  Advanced diet and no indication for surgery per surgical PA. The patient has no complaints of nausea, vomiting or abdominal pain.   3. Urinary tract infection: The patient has no urinary symptoms and urinalysis is unremarkable.  I do not think patient has UTI. The patient was given IV Rocephin. Patient reports nausea with cephalosporins.  Zofran as needed.  Discontinued Rocephin.  4Acute on chronic diastolic CHF.  Lower extremity edema: Recently Norvasc was decreased due to lower extremity edema.  BNP is 1871. Cxr: Cardiomegaly with mild pulmonary vascular prominence and interstitial prominence. Tiny bilateral pleural effusions. Findings suggest CHF.  Echocardiogram: The left ventricle has normal  systolic function with an ejection fraction of 60-65%.  Severe stenosis of aortic vale. Discontinued IV fluid, IV Lasix given, fluid restriction. Continue Lasix p.o. 20 mg daily and follow-up with Dr. Lady GaryFath as outpatient per cardiology consult.  Acute renal failure.  Possible due to diuretics.  Continue low-dose Lasix and follow-up BMP with PCP/Dr. Lady GaryFath.  5History of aortic valve replacement:  Follow-up with Dr. Lady GaryFath as outpatient.  6Essential hypertension: Continue Norvasc  7. Glaucoma: Continue eyedrops  DISCHARGE CONDITIONS:  Stable, discharge to home today. CONSULTS OBTAINED:  Treatment Team:  Dalia HeadingFath, Kenneth A, MD DRUG ALLERGIES:   Allergies  Allergen Reactions  . Lisinopril Shortness Of Breath and Cough  . Sulfur Nausea And Vomiting  . Amoxicillin Nausea And Vomiting  . Betoptic [Betaxolol] Other (See Comments)    headache  . Biaxin [Clarithromycin] Nausea And Vomiting  . Cephalexin Nausea And Vomiting  . Furosemide Other (See Comments)    Thirsty dry mouth  . Gabapentin Other (See Comments)    "spacey" passed out feeling blurry vision  . Hydrochlorothiazide Other (See Comments)    Lightheaded, thirsty, stomach pain, diarrhea  . Mydriacyl [Tropicamide] Other (See Comments)    Eyes remained dilatated for long time  . Penicillins Nausea And Vomiting  . Naproxen Rash   DISCHARGE MEDICATIONS:   Allergies as of 03/30/2019      Reactions   Lisinopril Shortness Of Breath, Cough   Sulfur Nausea And Vomiting   Amoxicillin Nausea And Vomiting   Betoptic [betaxolol] Other (See Comments)   headache   Biaxin [clarithromycin] Nausea And Vomiting   Cephalexin Nausea And Vomiting   Furosemide Other (See Comments)   Thirsty dry mouth   Gabapentin Other (See Comments)   "  spacey" passed out feeling blurry vision   Hydrochlorothiazide Other (See Comments)   Lightheaded, thirsty, stomach pain, diarrhea   Mydriacyl [tropicamide] Other (See Comments)   Eyes remained  dilatated for long time   Penicillins Nausea And Vomiting   Naproxen Rash      Medication List    TAKE these medications   acetaminophen 325 MG tablet Commonly known as: TYLENOL Take 650 mg by mouth every 6 (six) hours as needed.   Alphagan P 0.15 % ophthalmic solution Generic drug: brimonidine Place 1 drop into the right eye 2 (two) times a day.   amLODipine 2.5 MG tablet Commonly known as: NORVASC Take 2.5 mg by mouth daily.   docusate sodium 100 MG capsule Commonly known as: COLACE Take 100 mg by mouth daily.   dorzolamide-timolol 22.3-6.8 MG/ML ophthalmic solution Commonly known as: COSOPT Place 1 drop into the right eye 2 (two) times a day.   estradiol 0.1 MG/GM vaginal cream Commonly known as: ESTRACE Place 1 Applicatorful vaginally at bedtime.   furosemide 20 MG tablet Commonly known as: Lasix Take 1 tablet (20 mg total) by mouth daily.   multivitamin with minerals Tabs tablet Take 1 tablet by mouth daily.   VITAMIN D (CHOLECALCIFEROL) PO Take 1 tablet by mouth daily.        DISCHARGE INSTRUCTIONS:  See AVS. If you experience worsening of your admission symptoms, develop shortness of breath, life threatening emergency, suicidal or homicidal thoughts you must seek medical attention immediately by calling 911 or calling your MD immediately  if symptoms less severe.  You Must read complete instructions/literature along with all the possible adverse reactions/side effects for all the Medicines you take and that have been prescribed to you. Take any new Medicines after you have completely understood and accpet all the possible adverse reactions/side effects.   Please note  You were cared for by a hospitalist during your hospital stay. If you have any questions about your discharge medications or the care you received while you were in the hospital after you are discharged, you can call the unit and asked to speak with the hospitalist on call if the hospitalist  that took care of you is not available. Once you are discharged, your primary care physician will handle any further medical issues. Please note that NO REFILLS for any discharge medications will be authorized once you are discharged, as it is imperative that you return to your primary care physician (or establish a relationship with a primary care physician if you do not have one) for your aftercare needs so that they can reassess your need for medications and monitor your lab values.    On the day of Discharge:  VITAL SIGNS:  Blood pressure (!) 175/51, pulse 75, temperature 97.6 F (36.4 C), temperature source Oral, resp. rate 16, height 5' (1.524 m), weight 63.2 kg, SpO2 99 %. PHYSICAL EXAMINATION:  GENERAL:  85 y.o.-year-old patient lying in the bed with no acute distress.  EYES: Pupils equal, round, reactive to light and accommodation. No scleral icterus. Extraocular muscles intact.  HEENT: Head atraumatic, normocephalic. Oropharynx and nasopharynx clear.  NECK:  Supple, no jugular venous distention. No thyroid enlargement, no tenderness.  LUNGS: Normal breath sounds bilaterally, no wheezing, rales,rhonchi or crepitation. No use of accessory muscles of respiration.  CARDIOVASCULAR: S1, S2 normal. No murmurs, rubs, or gallops.  ABDOMEN: Soft, non-tender, non-distended. Bowel sounds present. No organomegaly or mass.  EXTREMITIES: No cyanosis, or clubbing.  Better bilateral leg edema. NEUROLOGIC: Cranial  nerves II through XII are intact. Muscle strength 5/5 in all extremities. Sensation intact. Gait not checked.  PSYCHIATRIC: The patient is alert and oriented x 3.  SKIN: No obvious rash, lesion, or ulcer.  DATA REVIEW:   CBC Recent Labs  Lab 03/28/19 0942  WBC 7.6  HGB 12.9  HCT 37.5  PLT 196    Chemistries  Recent Labs  Lab 03/28/19 0942  03/30/19 0504  NA 125*   < > 133*  K 4.3   < > 4.4  CL 95*   < > 102  CO2 18*   < > 23  GLUCOSE 142*   < > 119*  BUN 22   < > 28*   CREATININE 1.12*   < > 1.51*  CALCIUM 9.2   < > 8.7*  MG  --   --  1.9  AST 49*  --   --   ALT 70*  --   --   ALKPHOS 100  --   --   BILITOT 1.2  --   --    < > = values in this interval not displayed.     Microbiology Results  Results for orders placed or performed during the hospital encounter of 03/28/19  Urine Culture     Status: None   Collection Time: 03/28/19  9:42 AM   Specimen: Urine, Random  Result Value Ref Range Status   Specimen Description   Final    URINE, RANDOM Performed at [facility], [address] [place], [place] [address]    Special Requests   Final    NONE Performed at [facility], [address] [place], [place] [address]    Culture   Final    NO GROWTH Performed at [facility], [address] [place], [place] [address]    Report Status 03/29/2019 FINAL  Final  Novel Coronavirus,NAA,(SEND-OUT TO REF LAB - TAT 24-48 hrs); Hosp Order     Status: None   Collection Time: 03/28/19 10:42 AM   Specimen: Nasopharyngeal Swab; Respiratory  Result Value Ref Range Status   SARS-CoV-2, NAA NOT DETECTED NOT DETECTED Final    Comment: (NOTE) This test was developed and its performance characteristics determined by [facility]. This test has not been FDA cleared or approved. This test has been authorized by FDA under an Emergency Use Authorization (EUA). This test is only authorized for the duration of time the declaration that circumstances exist justifying the authorization of the emergency use of in vitro diagnostic tests for detection of SARS-CoV-2 virus and/or diagnosis of COVID-19 infection under section 564(b)(1) of the Act, 21 U.S.C. 914NWG-9(F)(6360bbb-3(b)(1), unless the authorization is terminated or revoked sooner. When diagnostic testing is negative, the possibility of a false negative result should be considered in the context of a patient's recent exposures and the presence of clinical signs and symptoms consistent  with COVID-19. An individual without symptoms of COVID-19 and who is not shedding SARS-CoV-2 virus would expect to have a negative (not detected) result in this assay. Performed  At: [facility] [address] [place], [place] [id number] Jolene SchimkeNagendra Sanjai MD IO:[id number]:[phone]    Coronavirus Source NASOPHARYNGEAL  Final    Comment: Performed at [facility], [address] [place], [place] [address]    RADIOLOGY:  No results found.   Management plans discussed with the patient, family and they are in agreement.  CODE STATUS: DNR   TOTAL TIME TAKING CARE OF THIS PATIENT: 33 minutes.  Demetrios Loll M.D on 03/30/2019 at 11:50 AM  Between 7am to 6pm - Pager - [phone]  After 6pm go to [email or web address] - [employer] [facility]  Office  [phone]  CC: Primary care physician; Ezequiel Kayser, MD   Note: This dictation was prepared with Dragon dictation along with smaller phrase technology. Any transcriptional errors that result from this process are unintentional.

## 2019-03-30 NOTE — Progress Notes (Signed)
Received Md order to discharge patient to home, reviewed homes, meds discharge instructions and follow up appointments with patient and patient verbalized understanding

## 2019-03-30 NOTE — TOC Transition Note (Signed)
Transition of Care [facility]) - CM/SW Discharge Note   Patient Details  Name: [name] MRN: [id number] Date of Birth: 12-27-1928  Transition of Care [facility] CM/SW Contact:  Shelbie Hutching, RN Phone Number: 03/30/2019, 10:51 AM   Clinical Narrative:     Daughter to provide transportation at discharge/  Final next level of care: Home/Self Care Barriers to Discharge: Barriers Resolved   Patient Goals and CMS Choice Patient states their goals for this hospitalization and ongoing recovery are:: Excited to go home      Discharge Placement                       Discharge Plan and Services   Discharge Planning Services: CM Consult                                 Social Determinants of Health (SDOH) Interventions     Readmission Risk Interventions No flowsheet data found.

## 2019-03-30 NOTE — Progress Notes (Signed)
Reviewed new lasix prescription with patient.

## 2019-04-12 ENCOUNTER — Telehealth: Payer: Self-pay | Admitting: Primary Care

## 2019-04-12 NOTE — Telephone Encounter (Signed)
Contacted patient's daughter Janett Billow in an attempt to schedule the Palliative Consult, no answer.  Left message explaining why I was calling along with my name and contact number.

## 2019-04-12 NOTE — Telephone Encounter (Signed)
Rec'd call back from patient's daughter, Jeraldine [facility]) and we have scheduled a Telehealth zoom Palliative consult for 04/17/19 @ 1 PM.

## 2019-04-17 ENCOUNTER — Other Ambulatory Visit: Payer: Medicare Other | Admitting: Primary Care

## 2019-04-17 ENCOUNTER — Other Ambulatory Visit: Payer: Self-pay

## 2019-07-30 DEATH — deceased

## 2020-01-31 IMAGING — US ULTRASOUND ABDOMEN LIMITED
1 series · 14 of 25 positions shown · non-contrast
Comparison: CT from earlier in the same day.

CLINICAL DATA: Abnormal gallbladder on recent CT

EXAM:
ULTRASOUND ABDOMEN LIMITED RIGHT UPPER QUADRANT

[Series 1: ultrasound abdomen limited · 14 of 109 slices shown]
[im 1/109]
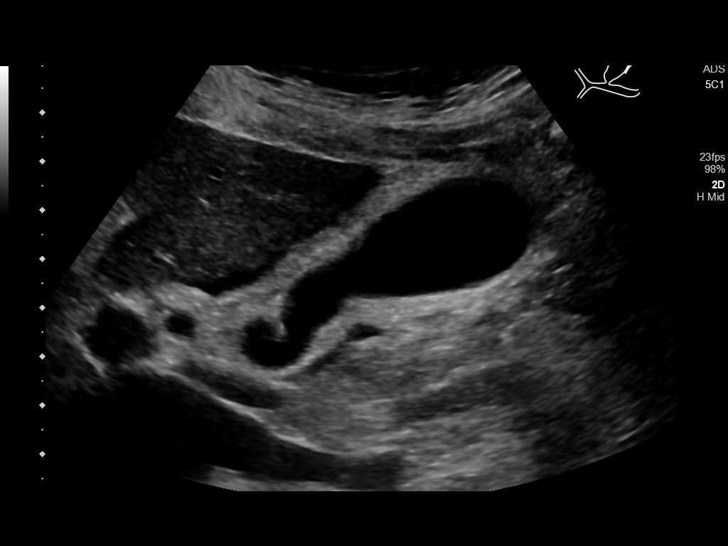
[im 10/109]
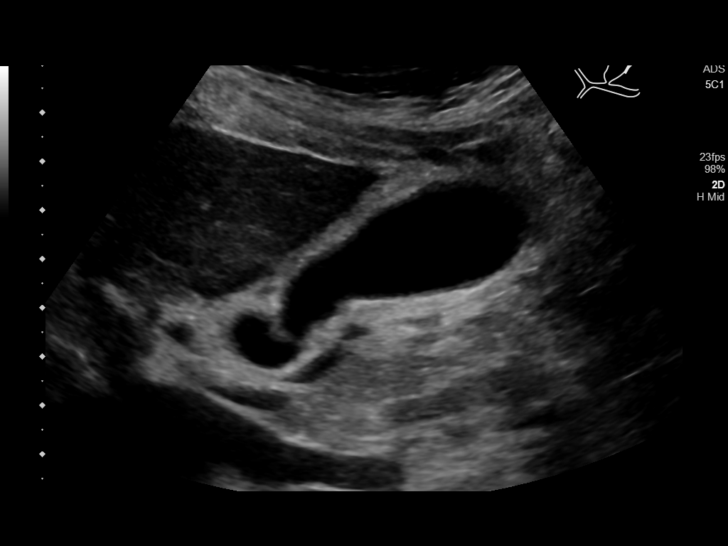
[im 19/109]
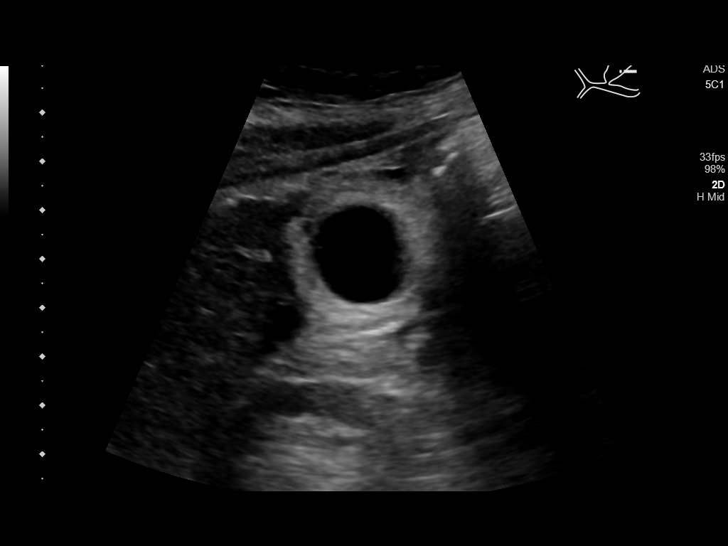
[im 28/109]
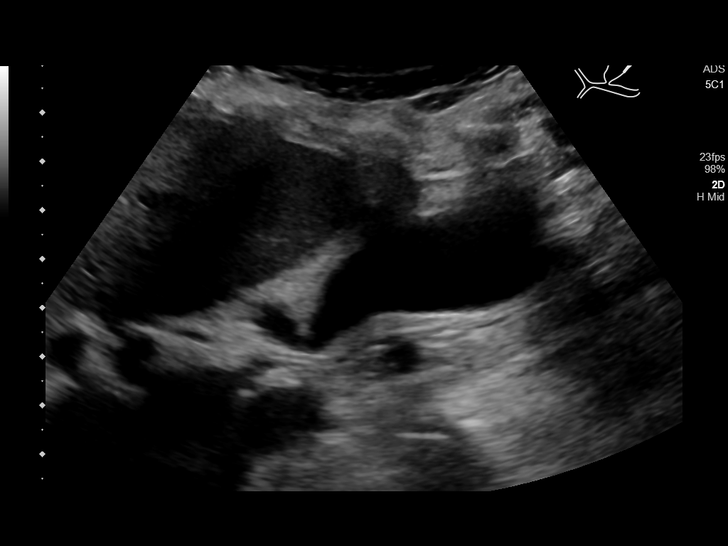
[im 37/109]
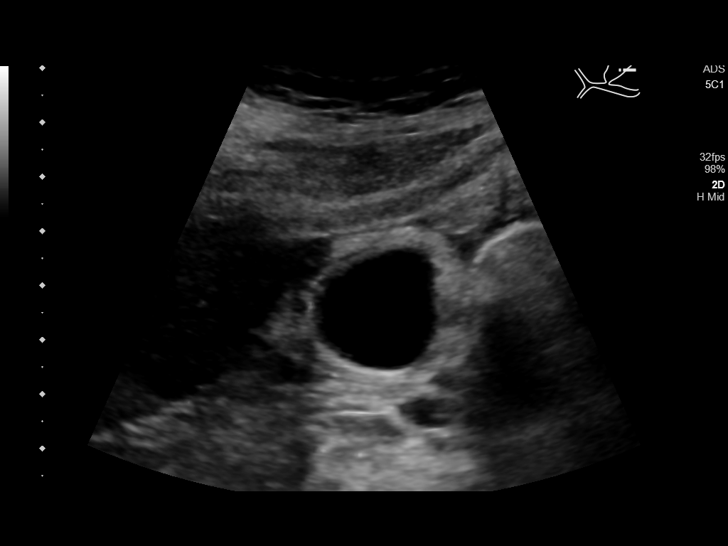
[im 41/109]
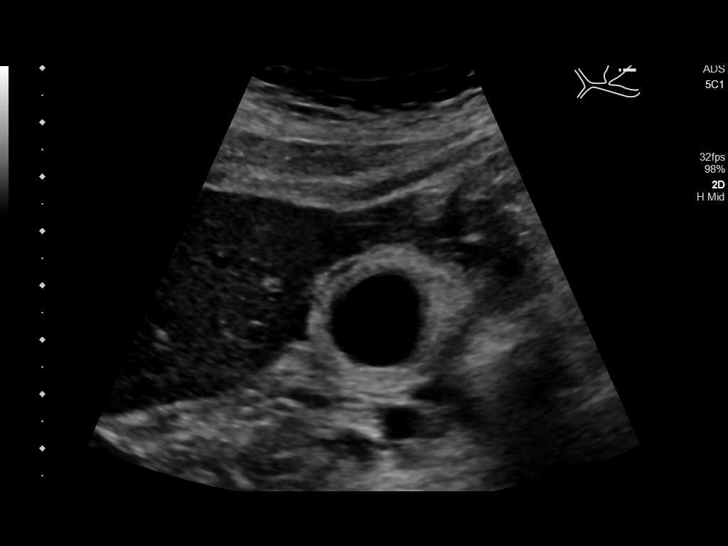
[im 50/109]
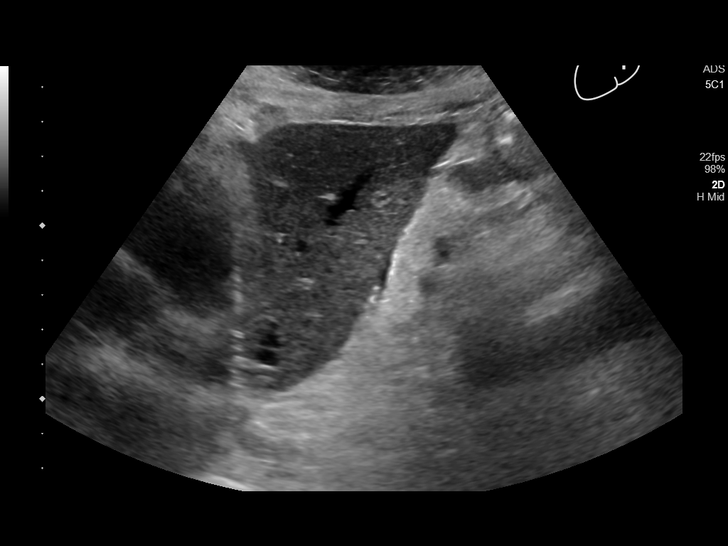
[im 59/109]
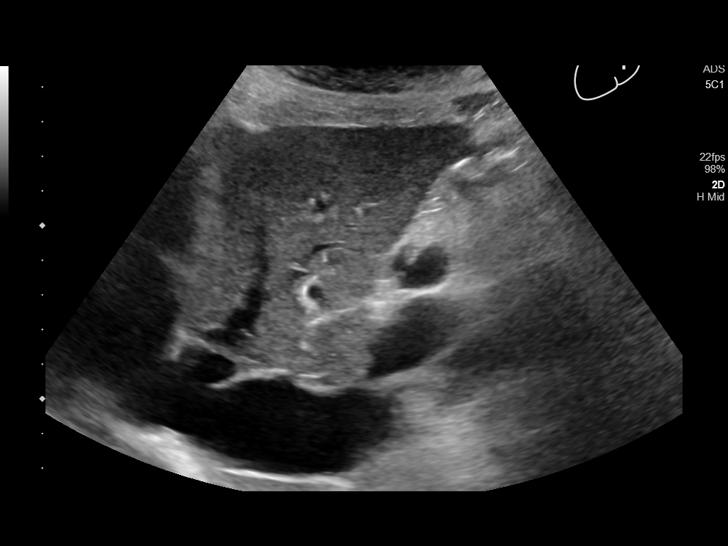
[im 68/109]
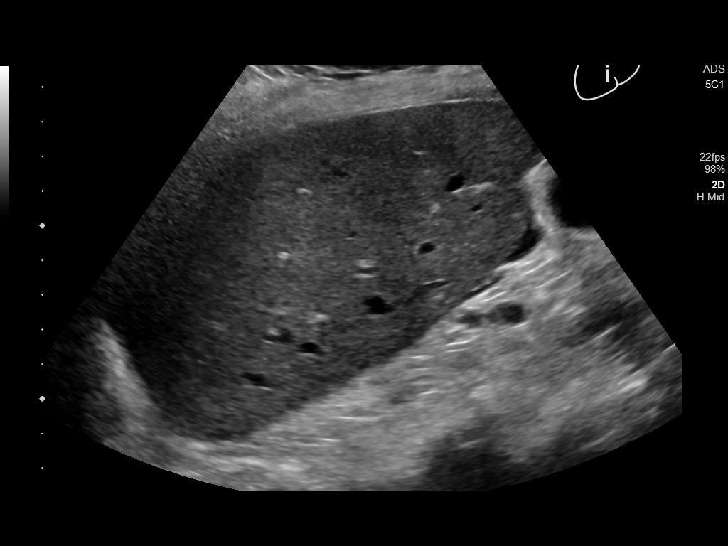
[im 73/109]
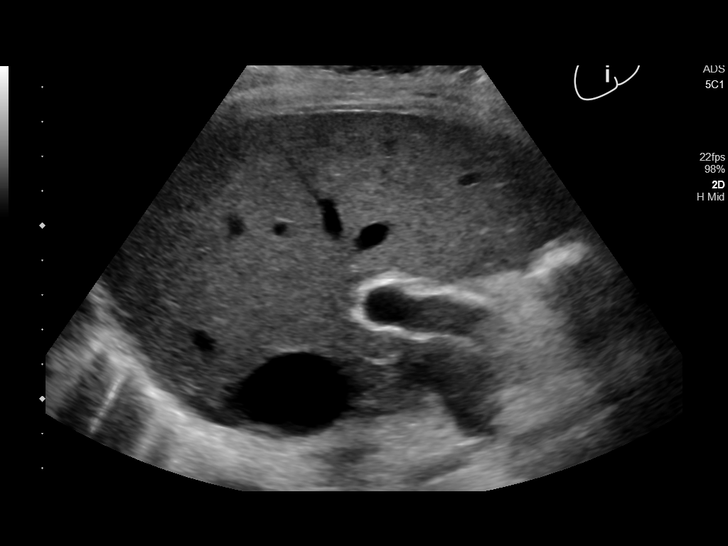
[im 82/109]
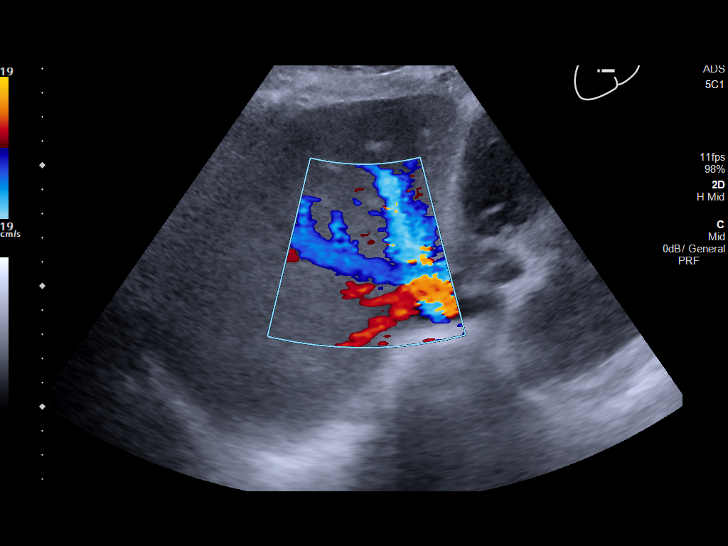
[im 91/109]
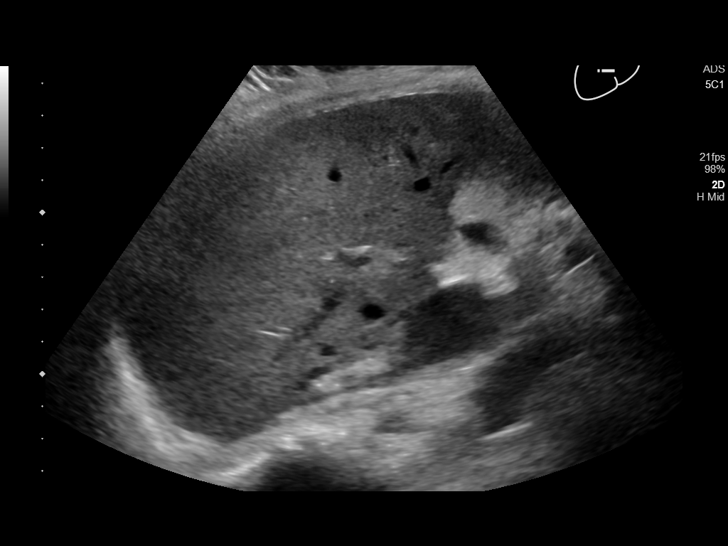
[im 100/109]
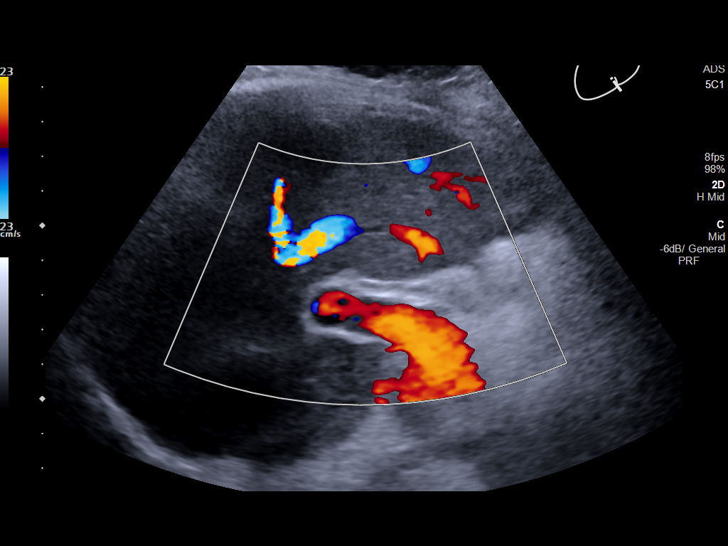
[im 109/109]
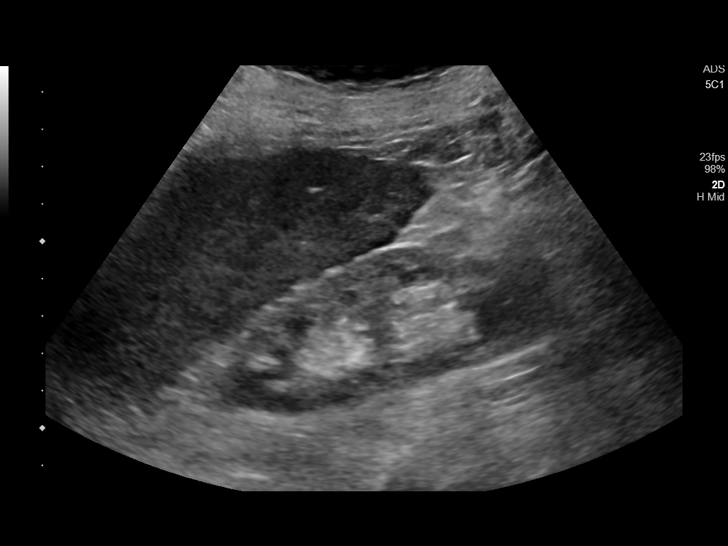

[14 of 25 positions shown; findings below may reference images not displayed]

FINDINGS: Gallbladder:

Gallbladder is well distended. Wall thickening to 7 mm is noted with
evidence of pericholecystic fluid. Negative sonographic Murphy sign
is elicited. These changes remain suspicious for cholecystitis.

Common bile duct:

Diameter: 3 mm.

Liver:

Mild nodularity of the liver is noted without focal mass. Portal
vein demonstrates some bidirectional flow indicating a mild degree
of portal hypertension.

Mild ascites is seen.
IMPRESSION: Gallbladder wall thickening with pericholecystic fluid. These
changes may be related to the underlying mild ascites although the
possibility of cholecystitis could not be totally excluded. HIDA
scan may be helpful for clarification.

Mild nodularity of the liver and changes suggestive of very early
portal hypertension.

## 2020-01-31 IMAGING — CT CT ABDOMEN AND PELVIS WITH CONTRAST
2 of 5 series · 16 of 46 positions shown, 18 images · IV contrast (APPLIED)
Comparison: None.

CLINICAL DATA: Generalized abdominal pain.

EXAM:
CT ABDOMEN AND PELVIS WITH CONTRAST
TECHNIQUE: Multidetector CT imaging of the abdomen and pelvis was performed
using the standard protocol following bolus administration of
intravenous contrast.
CONTRAST:  75mL OMNIPAQUE IOHEXOL 300 MG/ML  SOLN

[Series 2: axial (person_name) (person_name) · axial · 0.77mm/px · z∈[-555,-175]mm · 13 of 86 slices shown, 15 images]
[im 5/86  soft-tissue]
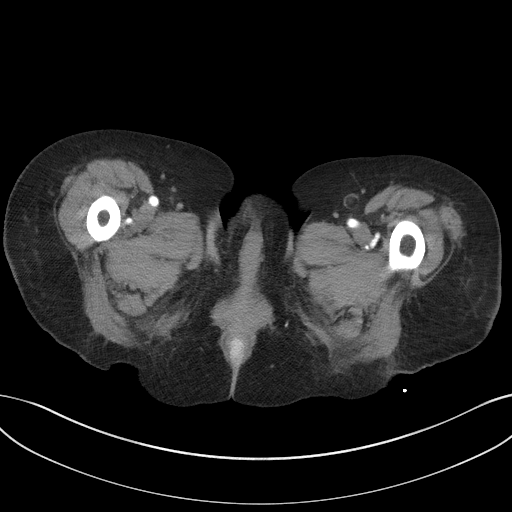
[im 5/86  bone]
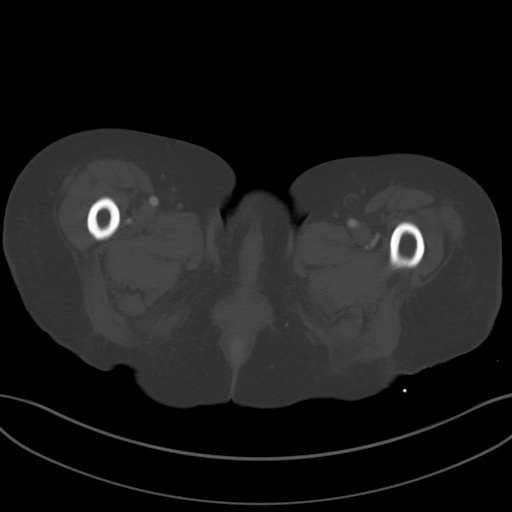
[im 9/86  soft-tissue]
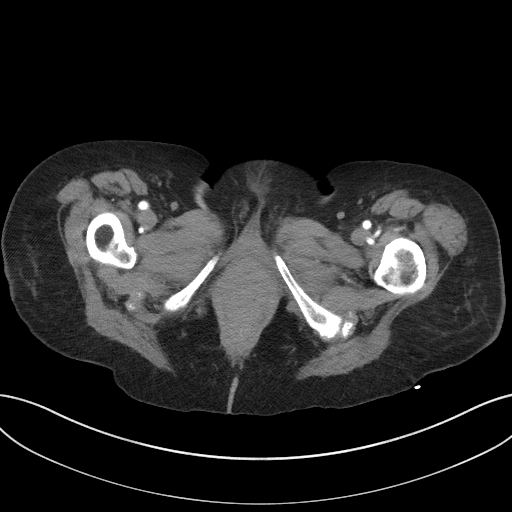
[im 18/86  soft-tissue]
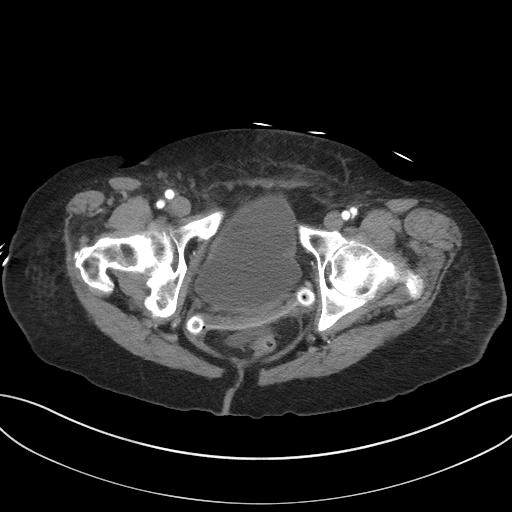
[im 23/86  soft-tissue]
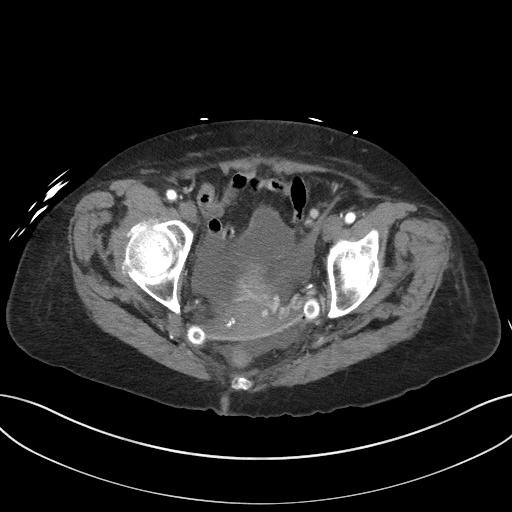
[im 27/86  soft-tissue]
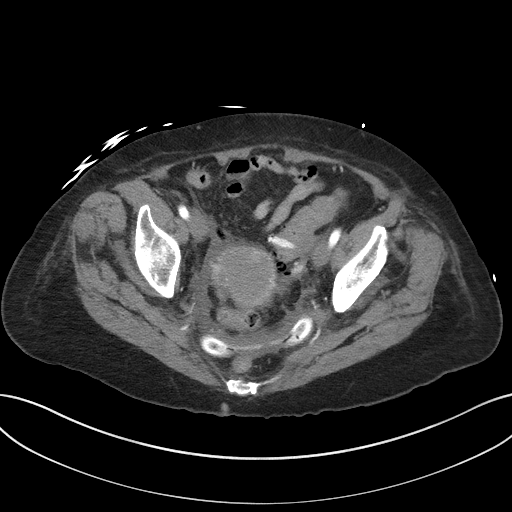
[im 36/86  soft-tissue]
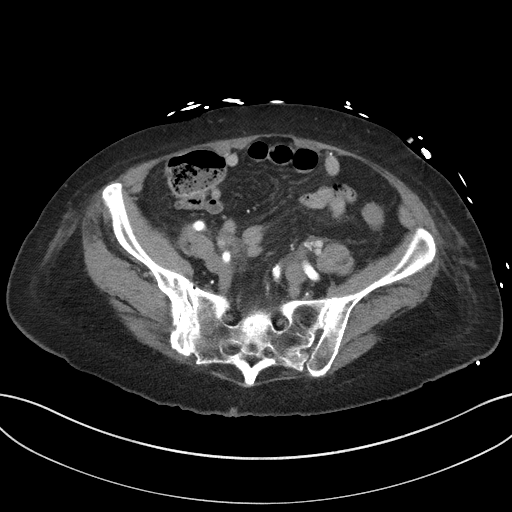
[im 41/86  soft-tissue]
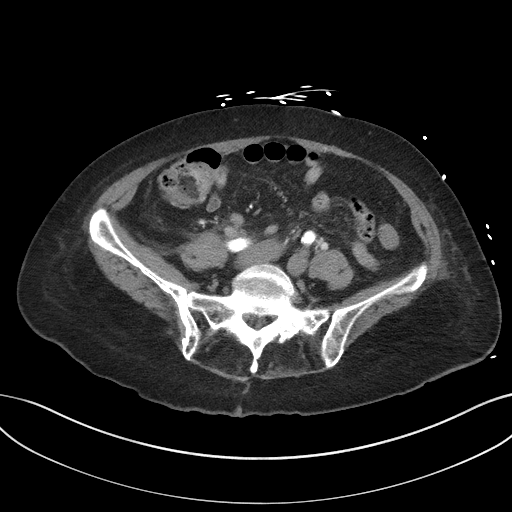
[im 50/86  soft-tissue]
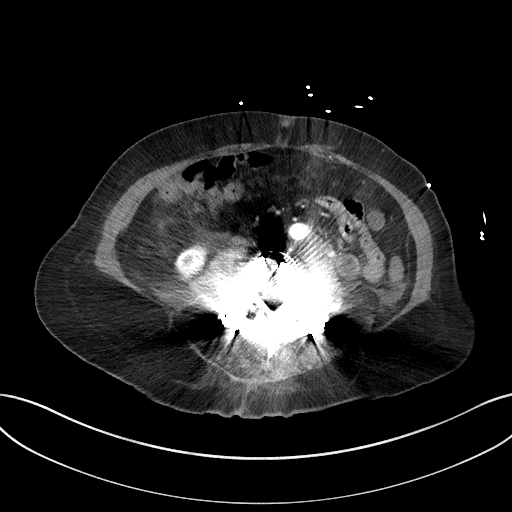
[im 59/86  soft-tissue]
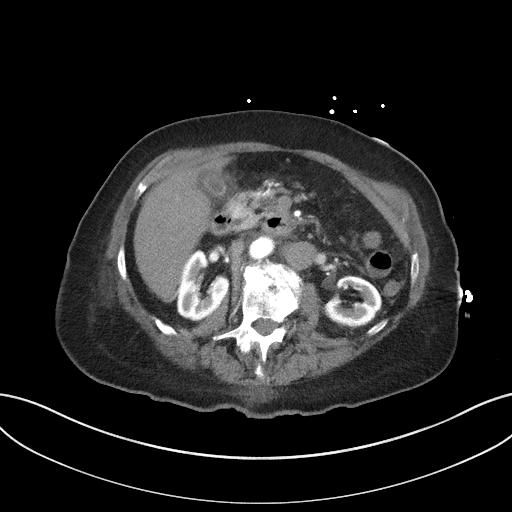
[im 59/86  bone]
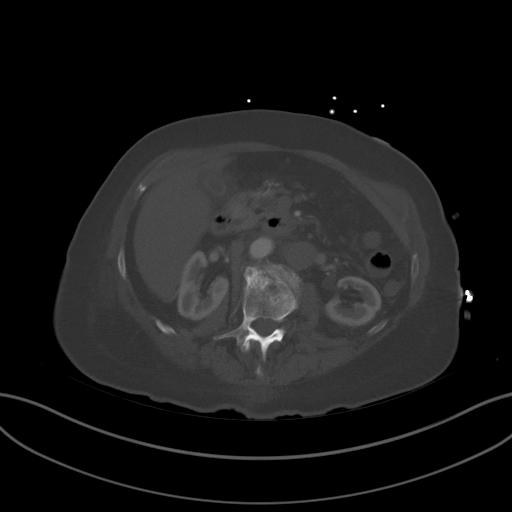
[im 63/86  soft-tissue]
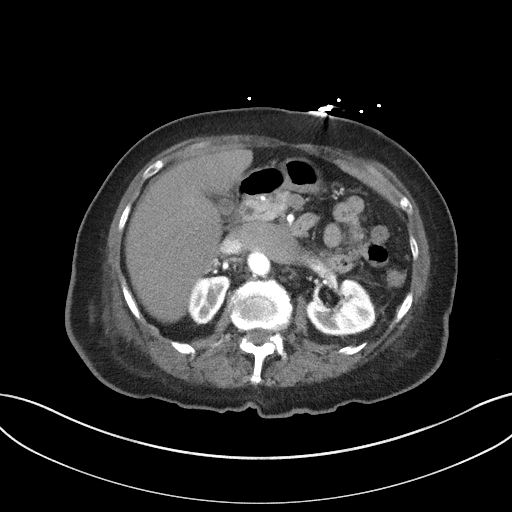
[im 68/86  soft-tissue]
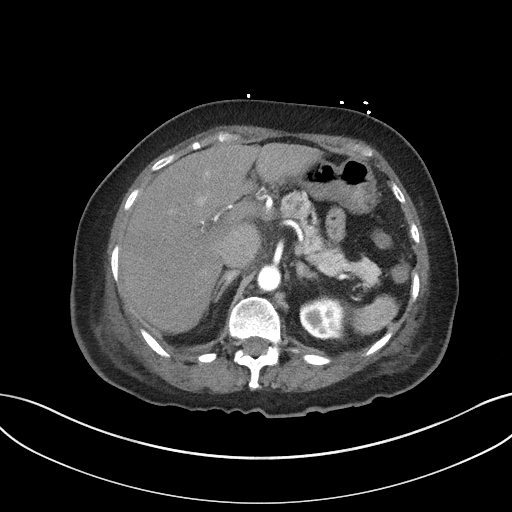
[im 77/86  soft-tissue]
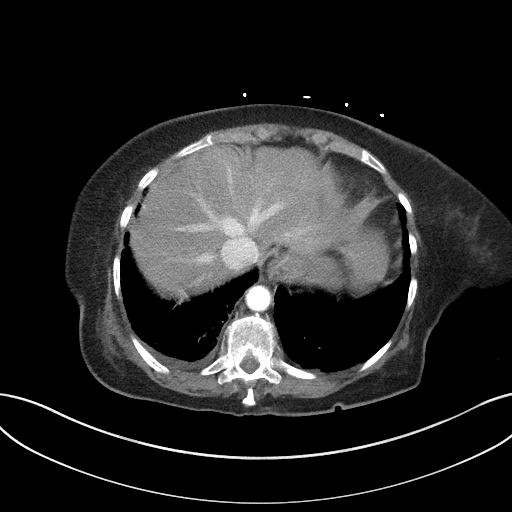
[im 81/86  soft-tissue]
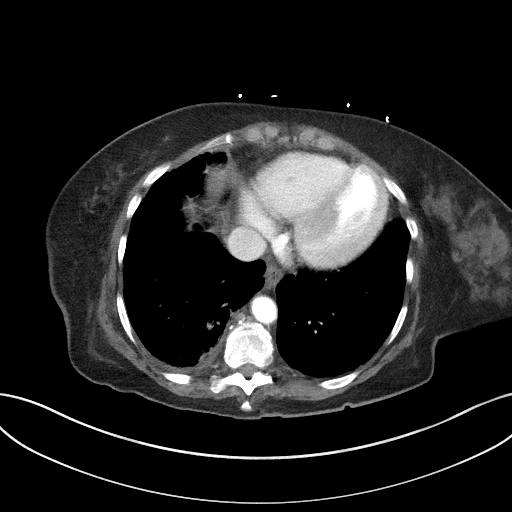

[Series 5: coronal st · coronal · 0.82mm/px · 3 of 81 slices shown]
[im 27/81  soft-tissue]
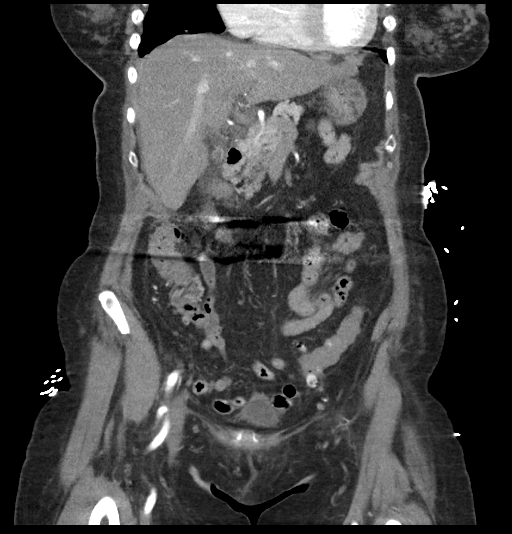
[im 36/81  soft-tissue]
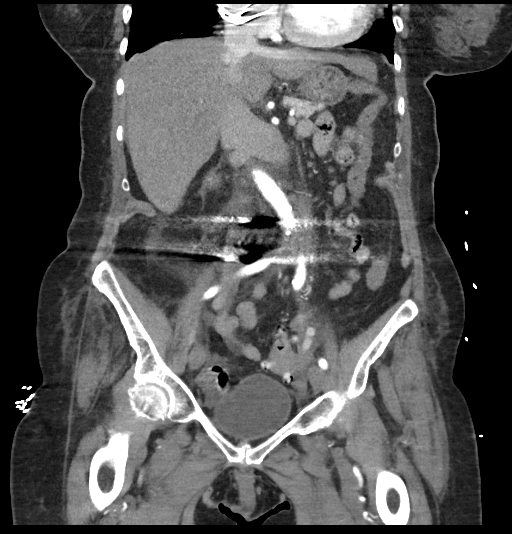
[im 45/81  soft-tissue]
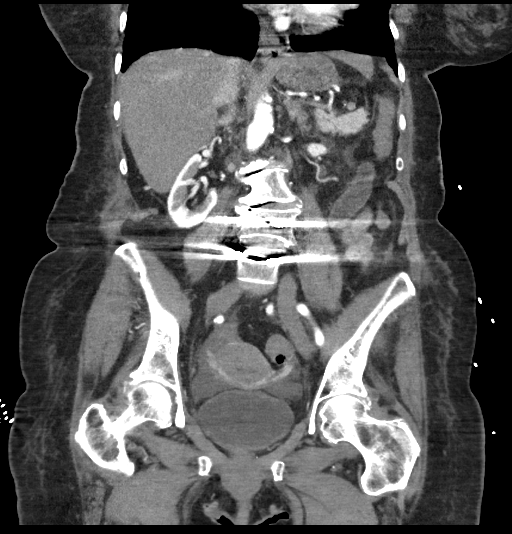

[16 of 46 positions shown; findings below may reference images not displayed]

FINDINGS: Lower chest: No acute abnormality.

Hepatobiliary: No definite cholelithiasis is noted. Mild wall
thickening of the gallbladder or pericholecystic fluid is noted. No
biliary dilatation is noted. The liver is unremarkable.

Pancreas: Unremarkable. No pancreatic ductal dilatation or
surrounding inflammatory changes.

Spleen: Normal in size without focal abnormality.

Adrenals/Urinary Tract: Adrenal glands are unremarkable. Kidneys are
normal, without renal calculi, focal lesion, or hydronephrosis.
Bladder is unremarkable.

Stomach/Bowel: Stomach is within normal limits. Appendix appears
normal. No evidence of bowel wall thickening, distention, or
inflammatory changes. Sigmoid diverticulosis is noted without
inflammation.

Vascular/Lymphatic: Aortic atherosclerosis. No enlarged abdominal or
pelvic lymph nodes.

Reproductive: Uterus and bilateral adnexa are unremarkable. Cerclage
device is noted.

Other: Small amount of free fluid is noted in the pelvis. No
definite hernia is noted.

Musculoskeletal: Status post surgical posterior fusion of L4-5.
Multilevel degenerative disc disease is noted in the lumbar spine.
No acute abnormality is noted.
IMPRESSION: Mild gallbladder wall thickening or pericholecystic fluid is noted,
but no definite cholelithiasis is noted. Ultrasound is recommended
for further evaluation.

Sigmoid diverticulosis without inflammation.

Small amount of free fluid is noted in the pelvis.

Aortic Atherosclerosis ([id number]).
